# Patient Record
Sex: Female | Born: 1960 | Race: Black or African American | Hispanic: No | Marital: Single | Smoking: Never smoker
Health system: Southern US, Community
[De-identification: ages and names within clinical notes are randomized; demographics above are authoritative.]

## PROBLEM LIST (undated history)

## (undated) DIAGNOSIS — I639 Cerebral infarction, unspecified: Secondary | ICD-10-CM

## (undated) DIAGNOSIS — D329 Benign neoplasm of meninges, unspecified: Secondary | ICD-10-CM

## (undated) DIAGNOSIS — O039 Complete or unspecified spontaneous abortion without complication: Secondary | ICD-10-CM

## (undated) DIAGNOSIS — I10 Essential (primary) hypertension: Secondary | ICD-10-CM

## (undated) DIAGNOSIS — G459 Transient cerebral ischemic attack, unspecified: Secondary | ICD-10-CM

## (undated) DIAGNOSIS — F410 Panic disorder [episodic paroxysmal anxiety] without agoraphobia: Secondary | ICD-10-CM

## (undated) HISTORY — DX: Complete or unspecified spontaneous abortion without complication: O03.9

## (undated) HISTORY — DX: Essential (primary) hypertension: I10

## (undated) HISTORY — PX: BREAST SURGERY: SHX581

## (undated) HISTORY — DX: Cerebral infarction, unspecified: I63.9

## (undated) HISTORY — DX: Benign neoplasm of meninges, unspecified: D32.9

---

## 1999-04-10 ENCOUNTER — Emergency Department (HOSPITAL_COMMUNITY): Admission: EM | Admit: 1999-04-10 | Discharge: 1999-04-10 | Payer: Self-pay | Admitting: Emergency Medicine

## 2000-03-26 ENCOUNTER — Emergency Department (HOSPITAL_COMMUNITY): Admission: EM | Admit: 2000-03-26 | Discharge: 2000-03-26 | Payer: Self-pay | Admitting: *Deleted

## 2000-09-11 ENCOUNTER — Encounter: Payer: Self-pay | Admitting: Emergency Medicine

## 2000-09-11 ENCOUNTER — Emergency Department (HOSPITAL_COMMUNITY): Admission: EM | Admit: 2000-09-11 | Discharge: 2000-09-11 | Payer: Self-pay | Admitting: Emergency Medicine

## 2001-05-04 ENCOUNTER — Emergency Department (HOSPITAL_COMMUNITY): Admission: EM | Admit: 2001-05-04 | Discharge: 2001-05-05 | Payer: Self-pay | Admitting: Emergency Medicine

## 2004-09-14 ENCOUNTER — Emergency Department (HOSPITAL_COMMUNITY): Admission: EM | Admit: 2004-09-14 | Discharge: 2004-09-14 | Payer: Self-pay | Admitting: *Deleted

## 2006-07-15 ENCOUNTER — Emergency Department (HOSPITAL_COMMUNITY): Admission: EM | Admit: 2006-07-15 | Discharge: 2006-07-15 | Payer: Self-pay | Admitting: Emergency Medicine

## 2007-10-29 DIAGNOSIS — I639 Cerebral infarction, unspecified: Secondary | ICD-10-CM

## 2007-10-29 DIAGNOSIS — G459 Transient cerebral ischemic attack, unspecified: Secondary | ICD-10-CM

## 2007-10-29 DIAGNOSIS — D329 Benign neoplasm of meninges, unspecified: Secondary | ICD-10-CM

## 2007-10-29 HISTORY — DX: Benign neoplasm of meninges, unspecified: D32.9

## 2007-10-29 HISTORY — DX: Cerebral infarction, unspecified: I63.9

## 2007-10-29 HISTORY — DX: Transient cerebral ischemic attack, unspecified: G45.9

## 2007-11-19 ENCOUNTER — Emergency Department (HOSPITAL_COMMUNITY): Admission: EM | Admit: 2007-11-19 | Discharge: 2007-11-19 | Payer: Self-pay | Admitting: Emergency Medicine

## 2008-01-29 ENCOUNTER — Ambulatory Visit: Payer: Self-pay | Admitting: Internal Medicine

## 2008-01-29 ENCOUNTER — Ambulatory Visit: Payer: Self-pay | Admitting: *Deleted

## 2008-01-29 ENCOUNTER — Inpatient Hospital Stay (HOSPITAL_COMMUNITY): Admission: EM | Admit: 2008-01-29 | Discharge: 2008-02-02 | Payer: Self-pay | Admitting: Emergency Medicine

## 2008-01-29 ENCOUNTER — Encounter (INDEPENDENT_AMBULATORY_CARE_PROVIDER_SITE_OTHER): Payer: Self-pay | Admitting: *Deleted

## 2008-02-02 ENCOUNTER — Encounter (INDEPENDENT_AMBULATORY_CARE_PROVIDER_SITE_OTHER): Payer: Self-pay | Admitting: *Deleted

## 2008-03-04 ENCOUNTER — Encounter: Payer: Self-pay | Admitting: Internal Medicine

## 2008-03-14 DIAGNOSIS — O039 Complete or unspecified spontaneous abortion without complication: Secondary | ICD-10-CM | POA: Insufficient documentation

## 2008-03-14 DIAGNOSIS — Z8673 Personal history of transient ischemic attack (TIA), and cerebral infarction without residual deficits: Secondary | ICD-10-CM | POA: Insufficient documentation

## 2008-03-14 DIAGNOSIS — I6350 Cerebral infarction due to unspecified occlusion or stenosis of unspecified cerebral artery: Secondary | ICD-10-CM

## 2008-03-14 DIAGNOSIS — D320 Benign neoplasm of cerebral meninges: Secondary | ICD-10-CM | POA: Insufficient documentation

## 2008-03-14 DIAGNOSIS — IMO0002 Reserved for concepts with insufficient information to code with codable children: Secondary | ICD-10-CM | POA: Insufficient documentation

## 2008-03-28 ENCOUNTER — Encounter (INDEPENDENT_AMBULATORY_CARE_PROVIDER_SITE_OTHER): Payer: Self-pay | Admitting: *Deleted

## 2008-03-28 ENCOUNTER — Ambulatory Visit: Payer: Self-pay | Admitting: Internal Medicine

## 2008-03-28 LAB — CONVERTED CEMR LAB
BUN: 11 mg/dL (ref 6–23)
Chloride: 107 meq/L (ref 96–112)
Creatinine, Ser: 0.82 mg/dL (ref 0.40–1.20)

## 2008-05-30 ENCOUNTER — Telehealth: Payer: Self-pay | Admitting: Internal Medicine

## 2008-07-27 ENCOUNTER — Telehealth (INDEPENDENT_AMBULATORY_CARE_PROVIDER_SITE_OTHER): Payer: Self-pay | Admitting: *Deleted

## 2009-06-13 ENCOUNTER — Emergency Department (HOSPITAL_COMMUNITY): Admission: EM | Admit: 2009-06-13 | Discharge: 2009-06-13 | Payer: Self-pay | Admitting: Emergency Medicine

## 2009-06-16 ENCOUNTER — Telehealth: Payer: Self-pay | Admitting: Internal Medicine

## 2009-06-23 ENCOUNTER — Telehealth: Payer: Self-pay | Admitting: Internal Medicine

## 2009-07-10 ENCOUNTER — Telehealth: Payer: Self-pay | Admitting: Internal Medicine

## 2009-08-31 ENCOUNTER — Emergency Department (HOSPITAL_BASED_OUTPATIENT_CLINIC_OR_DEPARTMENT_OTHER): Admission: EM | Admit: 2009-08-31 | Discharge: 2009-08-31 | Payer: Self-pay | Admitting: Emergency Medicine

## 2009-08-31 ENCOUNTER — Ambulatory Visit: Payer: Self-pay | Admitting: Diagnostic Radiology

## 2009-11-13 ENCOUNTER — Emergency Department (HOSPITAL_COMMUNITY): Admission: EM | Admit: 2009-11-13 | Discharge: 2009-11-13 | Payer: Self-pay | Admitting: Emergency Medicine

## 2009-11-27 ENCOUNTER — Encounter: Payer: Self-pay | Admitting: Internal Medicine

## 2009-11-27 ENCOUNTER — Ambulatory Visit: Payer: Self-pay | Admitting: Internal Medicine

## 2009-11-27 LAB — CONVERTED CEMR LAB
AST: 12 units/L (ref 0–37)
Albumin: 4.4 g/dL (ref 3.5–5.2)
Alkaline Phosphatase: 51 units/L (ref 39–117)
BUN: 12 mg/dL (ref 6–23)
Creatinine, Ser: 0.65 mg/dL (ref 0.40–1.20)
HDL: 59 mg/dL (ref >39)
LDL Cholesterol: 138 mg/dL — ABNORMAL HIGH (ref 0–99)
Potassium: 4 meq/L (ref 3.5–5.3)
Total Bilirubin: 0.7 mg/dL (ref 0.3–1.2)
Total CHOL/HDL Ratio: 3.6
VLDL: 16 mg/dL (ref 0–40)

## 2010-04-13 ENCOUNTER — Emergency Department (HOSPITAL_COMMUNITY): Admission: EM | Admit: 2010-04-13 | Discharge: 2010-04-13 | Payer: Self-pay | Admitting: Emergency Medicine

## 2010-04-13 ENCOUNTER — Encounter: Payer: Self-pay | Admitting: Internal Medicine

## 2010-05-30 ENCOUNTER — Emergency Department (HOSPITAL_COMMUNITY): Admission: EM | Admit: 2010-05-30 | Discharge: 2010-05-30 | Payer: Self-pay | Admitting: Emergency Medicine

## 2010-11-27 NOTE — Miscellaneous (Signed)
Summary: HIPAA Restrictions  HIPAA Restrictions   Imported By: Florinda Marker 11/29/2009 14:20:28  _____________________________________________________________________  External Attachment:    Type:   Image     Comment:   External Document

## 2010-11-27 NOTE — Miscellaneous (Signed)
Summary: ED visit.  Patient came to ED after having a syncope/TIA episode, most likely precipitated by sleep deprivation and emotional stress. She is dealing with terminal illness on her dog and facing some difficulty regarding appropiate treatment and most likely end having the dog euthanized. Patient is complaining of mild weakness on her left side and also subjective decreased light touch sensation. In the ED she had a CT scan which demonstrated no acute intracranial abnormalities; patient was also evaluated by neurology and they recommended MRI, carotid dopplers and 2 D-echo. Patient leave AMA after feeling better in order to be with her Debbie Howard at the [facility] office.   This decision was discussed with neurology group, and they feel that would be ok to perform these test as an outpatient and to follow with them 2-4 weeks after having the results.   Followup appointment was arrange with Dr. Logan Bores on Moday 6, 2011 at 1:30pm. During that visit arrange those test to be done, make sure her left side weakness and subjective decrease sensation is resolved and arrange followup with Dr. Danae Orleans (neurology) once results are back.

## 2010-11-27 NOTE — Assessment & Plan Note (Signed)
Summary: EST-CK/FU/MES/CFB   Vital Signs:  Patient profile:   50 year old female Height:      67 inches Weight:      158.19 pounds BMI:     24.87 O2 Sat:      100 % on Room air Temp:     97.0 degrees F Pulse rate:   75 / minute Resp:     14 per minute BP sitting:   118 / 71  (right arm) Cuff size:   regular  Vitals Entered By: Ricky Ala, RN (November 27, 2009 2:57 PM)  O2 Flow:  Room air CC: patient states that she was seen at the ED approx. 2 weeks ago for her BP, here for follow up and rx for bp,  Hypertension Management Is Patient Diabetic? No Pain Assessment Patient in pain? no      Nutritional Status BMI of 19 -24 = normal  Have you ever been in a relationship where you felt threatened, hurt or afraid?No   Does patient need assistance? Functional Status Self care Ambulation Normal   Primary Care Provider:  Hartley Barefoot MD  CC:  patient states that she was seen at the ED approx. 2 weeks ago for her BP, here for follow up and rx for bp, and Hypertension Management.  History of Present Illness: She presents for follow up. She went to ED 3 weeks ago. She was given her BP medication. She relates that cholesterol medication was causing joint stiffness, and pain. She doesnt want to take it. She refused to take any other cholesterol medication.   She is taking aspirin. She denies problems from bp medications. She has not follow up with Dr Pearlean Brownie. She doesnt want to follow up her meningioma.   Hypertension History:      Negative major cardiovascular risk factors include female age less than 85 years old and non-tobacco-user status.        Positive history for target organ damage include prior stroke (or TIA).      Preventive Screening-Counseling & Management  Alcohol-Tobacco     Smoking Status: never     Passive Smoke Exposure: no  Current Medications (verified): 1)  Lisinopril 10 Mg  Tabs (Lisinopril) .... Take 1 Tab By Mouth At Bedtime 2)  Anacin 81 Mg   Tbec (Aspirin) .... Take 1 Tablet By Mouth Once A Day  Allergies: 1)  ! Iodine 2)  ! Pepcid 3)  ! * Shellfish  Review of Systems  The patient denies fever, chest pain, syncope, dyspnea on exertion, peripheral edema, prolonged cough, headaches, hemoptysis, abdominal pain, melena, hematochezia, and severe indigestion/heartburn.    Physical Exam  General:  alert, well-developed, and well-nourished.   Head:  normocephalic and atraumatic.   Lungs:  normal respiratory effort, no intercostal retractions, no accessory muscle use, and normal breath sounds.   Heart:  normal rate and regular rhythm.   Abdomen:  soft, non-tender, normal bowel sounds, no distention, no masses, and no guarding.   Neurologic:  alert & oriented X3, cranial nerves II-XII intact, strength normal in all extremities, sensation intact to light touch, and sensation intact to pinprick.     Impression & Recommendations:  Problem # 1:  HYPERTENSION NEC (ICD-997.91) Her blood pressure is well. I will continue current regimen. I will check Bmet. I will give refill for 3 months.  Orders: T-Comprehensive Metabolic Panel ([id number])  Problem # 2:  MENINGIOMA (ICD-225.2) She deneis headache, or vision changes. She doesnt want to have imaging to follow  up her meningioma. The importance of follow up was explained to the patient.   Problem # 3:  CEREBROVASCULAR ACCIDENT (ICD-434.91) She is taking aspirin daily. She refused to take any cholesterol medications, Importance of this was explain to the patient.  I will check lipid profile. Low fat diet list was provied during this visit.  Her updated medication list for this problem includes:    Anacin 81 Mg Tbec (Aspirin) .Marland Kitchen... Take 1 tablet by mouth once a day  Complete Medication List: 1)  Lisinopril 10 Mg Tabs (Lisinopril) .... Take 1 tab by mouth at bedtime 2)  Anacin 81 Mg Tbec (Aspirin) .... Take 1 tablet by mouth once a day  Other Orders: T-Lipid Profile  ([id number])  Hypertension Assessment/Plan:      The patient's hypertensive risk group is category C: Target organ damage and/or diabetes.  Today's blood pressure is 118/71.    Patient Instructions: 1)  Please schedule a follow-up appointment in 3 months. Prescriptions: LISINOPRIL 10 MG  TABS (LISINOPRIL) Take 1 tab by mouth at bedtime  #30 x 3   Entered and Authorized by:   Hartley Barefoot MD   Signed by:   Hartley Barefoot MD on 11/27/2009   Method used:   Electronically to        [facility] [phone]* (retail)       [address]       [place], [place]  [address]       Ph: [phone]       Fax: [phone]   RxID:   [id number]   Prevention & Chronic Care Immunizations   Influenza vaccine: Not documented   Influenza vaccine deferral: Deferred  (11/27/2009)    Tetanus booster: Not documented    Pneumococcal vaccine: Not documented  Colorectal Screening   Hemoccult: Not documented    Colonoscopy: Not documented  Other Screening   Pap smear: Not documented    Mammogram: Not documented   Smoking status: never  (11/27/2009)  Lipids   Total Cholesterol: Not documented   Lipid panel action/deferral: Lipid Panel ordered   LDL: Not documented   LDL Direct: Not documented   HDL: Not documented   Triglycerides: Not documented   Process Orders Check Orders Results:     [facility]: ABN not required for this insurance Tests Sent for requisitioning (November 28, 2009 8:49 PM):     11/27/2009: [facility] -- T-Comprehensive Metabolic Panel [[id number]] (signed)     11/27/2009: [facility] -- T-Lipid Profile [phone] (signed)   Process Orders Check Orders Results:     [facility]: ABN not required for this insurance Tests Sent for requisitioning (November 28, 2009 8:49 PM):     11/27/2009: [facility] -- T-Comprehensive Metabolic Panel [[id number]]  (signed)     11/27/2009: [facility] -- T-Lipid Profile [phone] (signed)

## 2010-12-31 ENCOUNTER — Encounter: Payer: Self-pay | Admitting: Ophthalmology

## 2011-01-13 LAB — DIFFERENTIAL
Basophils Absolute: 0 K/uL (ref 0.0–0.1)
Basophils Relative: 0 % (ref 0–1)
Eosinophils Absolute: 0 K/uL (ref 0.0–0.7)
Eosinophils Relative: 0 % (ref 0–5)
Lymphocytes Relative: 18 % (ref 12–46)
Lymphs Abs: 0.9 10*3/uL (ref 0.7–4.0)
Monocytes Absolute: 0.3 10*3/uL (ref 0.1–1.0)
Monocytes Relative: 6 % (ref 3–12)
Neutro Abs: 3.7 10*3/uL (ref 1.7–7.7)
Neutrophils Relative %: 76 % (ref 43–77)

## 2011-01-13 LAB — CBC
HCT: 42.6 % (ref 36.0–46.0)
Hemoglobin: 15.1 g/dL — ABNORMAL HIGH (ref 12.0–15.0)
MCHC: 35.3 g/dL (ref 30.0–36.0)
MCV: 88 fL (ref 78.0–100.0)
Platelets: 240 10*3/uL (ref 150–400)
RBC: 4.84 MIL/uL (ref 3.87–5.11)
RDW: 12.6 % (ref 11.5–15.5)
WBC: 4.9 10*3/uL (ref 4.0–10.5)

## 2011-01-13 LAB — POCT CARDIAC MARKERS
CKMB, poc: 3.1 ng/mL (ref 1.0–8.0)
Myoglobin, poc: 162 ng/mL (ref 12–200)
Troponin i, poc: <0.05 ng/mL (ref 0.00–0.09)

## 2011-01-13 LAB — COMPREHENSIVE METABOLIC PANEL
AST: 28 U/L (ref 0–37)
Albumin: 4.1 g/dL (ref 3.5–5.2)
BUN: 8 mg/dL (ref 6–23)
Calcium: 9.6 mg/dL (ref 8.4–10.5)
Creatinine, Ser: 0.84 mg/dL (ref 0.4–1.2)
GFR calc Af Amer: >60 mL/min (ref >60)

## 2011-01-13 LAB — CK TOTAL AND CKMB (NOT AT ARMC)
CK, MB: 8.3 ng/mL (ref 0.3–4.0)
Relative Index: 1.1 (ref 0.0–2.5)
Total CK: 758 U/L — ABNORMAL HIGH (ref 7–177)

## 2011-01-13 LAB — COMPREHENSIVE METABOLIC PANEL WITH GFR
ALT: 22 U/L (ref 0–35)
Alkaline Phosphatase: 58 U/L (ref 39–117)
CO2: 28 meq/L (ref 19–32)
Chloride: 104 meq/L (ref 96–112)
GFR calc non Af Amer: >60 mL/min (ref >60)
Glucose, Bld: 122 mg/dL — ABNORMAL HIGH (ref 70–99)
Potassium: 3.3 meq/L — ABNORMAL LOW (ref 3.5–5.1)
Sodium: 139 meq/L (ref 135–145)
Total Bilirubin: 0.7 mg/dL (ref 0.3–1.2)
Total Protein: 7.9 g/dL (ref 6.0–8.3)

## 2011-01-13 LAB — GLUCOSE, CAPILLARY: Glucose-Capillary: 111 mg/dL — ABNORMAL HIGH (ref 70–99)

## 2011-01-13 LAB — PROTIME-INR
INR: 1.04 (ref 0.00–1.49)
Prothrombin Time: 13.5 seconds (ref 11.6–15.2)

## 2011-01-13 LAB — APTT: aPTT: 27 seconds (ref 24–37)

## 2011-01-13 LAB — TROPONIN I: Troponin I: 0.01 ng/mL (ref 0.00–0.06)

## 2011-03-12 NOTE — Discharge Summary (Signed)
NAMEASHLINN, HEMRICK NO.:  [id number]   MEDICAL RECORD NO.:  [id number]          PATIENT TYPE:  INP   LOCATION:  4705                         FACILITY:  [facility]   PHYSICIAN:  Manning Charity, MD     DATE OF BIRTH:  Mar 13, 1961   DATE OF ADMISSION:  01/29/2008  DATE OF DISCHARGE:  02/02/2008                               DISCHARGE SUMMARY   DISCHARGE DIAGNOSES:  1. Acute frontal lobe precentral gyrus stroke.  2. Meningioma, incidental finding.  3. Hypertension.  4. Miscarriage.   DISCHARGE MEDICATIONS:  1. Lisinopril 10 mg by mouth daily.  2. Simvastatin 40 mg p.o. daily.  3. Aspirin 81 mg p.o. daily.   DISPOSITION AND FOLLOWUP:  Ms. Adalae Baysinger has an appointment at the  outpatient clinic of [facility] on February 25, 2008 at 1:30.  She is going  to need during that appointment a BMET to followup potassium and  Creatinine level because she was started on lisinopril.  She also will  see Dr. Pearlean Brownie, neurology, for meningioma follow up.   CONSULTATIONS:  Pramod P. Pearlean Brownie, MD.  He was consulted to help with the  evaluation of a stroke and with management of meningioma.   HISTORY OF PRESENT ILLNESS:  This is a 50 year old female who presents  complaining of right facial droop and Dysarthria that started when she  woke up.  She denies any weakness or numbness.  No vision problems.   PHYSICAL EXAMINATION:  VITAL SIGNS:  Temperature 98.7, blood pressure  147/96, pulse 87, respiration 22, and O2 saturation 99% on room air.  GENERAL:  She was alert and awake.  HEENT:  Eyes:  Pupils equal and reactive to light.  Extraocular muscles  intact.  RESPIRATION:  Clear breath sounds.  CARDIOVASCULAR:  S1 and S2.  Normal regular rhythm and rate.  NEUROLOGIC:  Cranial nerves II, III, IV,VI, and VII intact.  She has  left facial droop, tongue deviation to the left side.  She has mild  dysarthria.  Motor exam 5/5 throughout.  Sensory, she has gross  sensation intact and normal  sensation on the face.   LABORATORY DATA:  Sodium 139, potassium 3.7, chloride 106, bicarb 27,  BUN 13, and creatinine 0.78.  White blood cells 3.9, hemoglobin 13,  platelets  22,261.  PT 12.4 and PTT 26.  CT of the head, no acute  intracranial abnormalities.  Cystic lesions of the pineal gland.   PROCEDURES PERFORMED:  1. She had an angiogram that showed minimal right internal carotid      artery narrowing just distal to the bulb.  This might be even      positional.  Incidental note is made of minimal FNB-like changes in      the right vertebral artery at the level of C1 and C2.   1. She had an MRI of the head without contrast that showed chronic      probable infarct bordering the bilateral parieto-occipital sulci,      possible tiny chronic right PICA territory infarct, acute right      frontal lobe paracentral gyrus  infarct without hemorrhage or mass      effect in the region of facial motor representation.   1. 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   1. MRA of the head showed anterior circulation is within normal      limits.  No definitive right MCA branch occlusion is identified.      Poor flow in the distal posterior cerebral artery, right greater      than left is noted, maybe related to the chronic infarct described      above.  Otherwise, no strong evidence of intracranial      arteriosclerosis.  2. She had a CT of the head.  There were no acute intracranial      abnormalities.  3. A 2-D echo shows overall left ventricular systolic function was      normal.  Left ventricular ejection fraction was estimated to range      between 60% to 70%.  4. She had a TEE that showed left ventricular size normal, ejection      fraction 60%.  Aorta was normal.  Structurally normal heart.  No      echo evidence for cardiac source of thrombi.   HOSPITAL COURSE BY PROBLEMS:  1. Frontal lobe precentral gyrus stroke.  Ms Mamie Hundertmark was  admitted      to telemetry floor to monitor for any arrhythmia.  Her blood      pressure was monitored.  We did not start any antihypertensive      medications because of her stroke. Systolic blood pressure should      not be decrease unless is more than 200.  CT scan was ordered which      was significant for acute stroke.  She was started on aspirin.      Lipid profile was ordered.  LDL 136, HDL 45, cholesterol 175.  We      started her on simvastatin 40 mg daily.  Hemoglobin A1c was ordered      and was normal at 5.5.  ESR was normal at 8.  Due to her young      presentation with stroke and prior family history of stroke, we did      the workup for hypercoagulable state which came back negative.  ANA      was negative.  Antithrombin III was normal at 103.  Protein C was      normal at 96.  Protein S level was 92 and was normal.  HIV test was      nonreactive.  Factor V Leiden test was negative.  Also, we had a      consult from the cardiologists, who performed a TTE to look for the      source of a cardiac emboli and the TEE was negative.  Neurology was      consulted also to help with meningioma.  They will follow up her as      an outpatient.  Also homocysteine level was ordered and it was      normal.  During the hospitalization, her speech symptoms improved.      She was speaking better.  We started her on blood pressure      medication lisinopril 10 mg daily after she was stable.   1. Meningioma.  This was an incidental finding.  Neurology will      monitor this on an outpatient basis.   1.  Leukopenia.  She was found to have a white blood cell of 3.9.      Repeat white blood cells was normal with a level of 4.0.   1. Hypertension.  We started her on lisinopril 10 mg daily.  She will      have a BMET within 2 weeks at the outpatient clinic.   Discharge labs and vitals on the day of discharge was stable.  Her blood  pressure was 121/82, O2 saturation 96%, pulse 76, temperature  98.1, and  respirations 20.  She was in no acute distress.  Her neurological  symptoms have improved.  Labs with sodium 140, potassium 3.9, chloride  106, CO2 of 27, glucose 112, BUN 13, creatinine 0.83, and calcium 9.6.      Hartley Barefoot, MD  Electronically Signed      Manning Charity, MD  Electronically Signed    BR/MEDQ  D:  02/02/2008  T:  02/03/2008  Job:  [id number]   cc:   Pramod P. Pearlean Brownie, MD

## 2011-03-12 NOTE — Consult Note (Signed)
NAMEIDONIA, ZOLLINGER NO.:  [id number]   MEDICAL RECORD NO.:  [id number]          PATIENT TYPE:  INP   LOCATION:  4705                         FACILITY:  [facility]   PHYSICIAN:  Melvyn Novas, M.D.  DATE OF BIRTH:  27-Sep-1961   DATE OF CONSULTATION:  DATE OF DISCHARGE:                                 CONSULTATION   HISTORY OF PRESENT ILLNESS:  This is a 50 year old lady currently  admitted to the internal medicine teaching service, Dr. Gareth Eagle as  attending, Dr. Julaine Fusi called this consult.   This patient was admitted yesterday on January 29, 2008, in the morning  hours.  I see the patient today for the stroke consult on January 30, 2008,  the patient presented yesterday with an over 24-hour history of new  onset central facial palsy left-sided and tongue deviation to the left.  Imaging studies confirmed a frontal stroke and bicipital older strokes.  Incidentally, a meningioma was also found which is not likely to have  caused any of her complications.   The patient is alert and oriented, pleasant, fully oriented with good  memory.  She states that she awoke on Thursday and had noticed that her  tongue felt heavy.  She became aware of her deficit because she could  not eat well.  She is an [place] female.  She states she is not  aware of any past medical history.  Denies any drug or alcohol use and  is not in pain or any distress.  There was no peripheral weakness  noticed.  Her extremities worked well, she states.  All findings were  just related to her facial droop.   REVIEW OF SYSTEMS.:  The patient denies headache, dizziness, endorses  clarity of speech and with swallowing.  She also states that she started  to drool out of the left angle of her mouth.  She describes again that  her tongue felt heavy, but she had no agraphia, aphagia, dyslexia, nor  dysmetria, no fevers, no tremors, gait disorder.  No seizures.  No  nausea, vomiting or  diarrhea.   PAST MEDICAL HISTORY:  Again, the patient has no known history of any  the usual risk factors for stroke but a strong family history, as her  mother died at 60 of a stroke and her father at 44 years of age of an  MI.  She also had one brother dying of complications of diabetes.  She  has two living brothers and two sisters.   SOCIAL HISTORY:  The patient is single, has two adult daughters, but  lives alone.  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   LABORATORY DATA:  The patient has been found to have leukopenia with a  white blood cell count of 3.9, H&H was 13.0/38.7, MCV was 84 normal  range.  Her platelet count was 261,000.  Potassium was 3.7, sodium 39,  glucose was 98.  The patient's HbA1c was 5.5 and well within normal  limits.  PT was 12.4, INR 0.9, HDL 95,  and LDL 136.  Total cholesterol  was just under 200.  Sed rate was 8, C-reactive protein was pending.  The patient was described as having initially presented with lower  extremity edema which I cannot appreciate, but she had been in  sequential compression devices for the day.   HOSPITAL MEDICATIONS:  1. Ecotrin.  2. A full-size aspirin 325 grams a day.  3. Protonix 40 mg a day.   ALLERGIES:  SHE HAS NO KNOWN ALLERGIC REACTIONS TO MEDICATIONS.   PHYSICAL EXAMINATION:  GENERAL:  No goiter, no lymph nodes, no bruit, no  murmur.  No abdominal tenderness.  Again, I do not see clubbing,  cyanosis or edema.  No rash.  No bruising.  There is only facial and  tongue dysmetria and asymmetry.  VITAL SIGNS:  Respiratory rate of 17, blood pressure 130/70.  LUNGS:  Regular breathing sounds.  No wheezing.  No rhonchi.  No rales.  ABDOMEN:  Soft, nontender, nondistended.  There is no guarding.  HEART:  The patient had no systolic murmurs, no carotid bruits.  No  temporal artery induration, lymph node swelling or goiter.  NEUROLOGICAL:  Cranial nerves all intact except for a  central seventh  nerve palsy.  Speech is slightly impaired and dysarthric due to the  facial droop and tongue deviation.  A bedside swallow test was not  performed by myself, but the patient said that she was able to drink  fluids with a straw.  I did not find he had a swallowing evaluation  documented in the chart.  Motor examination shows 5/504 extremities with  deep tendon reflexes being preserved symmetrically and her toes going  down bilaterally to plantar stimulation, simultaneous stimulation for  sensory shows preserved primary modalities without extinction.  The  sensory is intact in the face as well.  Gait is stable and intact.  Coordination by finger-nose test is intact without ataxia, dysmetria or  tremor.   STUDIES:  MRI review shows a frontal stroke and older central occipital  stroke that are related probably to a long history of hypertension,  however, the patient is not aware of such.  It is only the most common  risk factor.  The patient has advanced white matter disease for age.   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  She has no  memory deficits.  She has no severe migraine, and her lower seventh  nerve is only involved on the left side.  Corneal reflexes are  preserved.  Full extraocular movements are preserved.  I suspect that  this is stroke related.  However, the stroke itself of the multi focal  imaging results need to be evaluated for vasculitis.  I would also check  by toxicology screen to make sure that the patient is not a cocaine  abuser.  I would recommend a protein electrophoresis and a C-reactive  protein, even that the sed rate was negative.  Embolic events are less  likely, but need to be ruled out, and I would await the echocardiogram  which is already ordered.  I agree with the aspirin which was started on  Monday when the stroke service is following the patient under Dr.   Marlis Edelson guidance.  I would like him to discuss with the teaching service  as an angiogram should be performed.      Melvyn Novas, M.D.  Electronically Signed     CD/MEDQ  D:  01/30/2008  T:  01/30/2008  Job:  [id number]   cc:   Edsel Petrin, D.O.

## 2011-03-14 ENCOUNTER — Emergency Department (HOSPITAL_COMMUNITY)
Admission: EM | Admit: 2011-03-14 | Discharge: 2011-03-14 | Disposition: A | Discharge status: Home / Self Care | Payer: Self-pay | Attending: Emergency Medicine | Admitting: Emergency Medicine

## 2011-03-14 ENCOUNTER — Inpatient Hospital Stay (INDEPENDENT_AMBULATORY_CARE_PROVIDER_SITE_OTHER)
Admission: RE | Admit: 2011-03-14 | Discharge: 2011-03-14 | Disposition: A | Discharge status: Home / Self Care | Payer: Self-pay | Source: Ambulatory Visit

## 2011-03-14 DIAGNOSIS — I10 Essential (primary) hypertension: Secondary | ICD-10-CM

## 2011-03-14 DIAGNOSIS — Z91199 Patient's noncompliance with other medical treatment and regimen due to unspecified reason: Secondary | ICD-10-CM | POA: Insufficient documentation

## 2011-03-14 DIAGNOSIS — Z9119 Patient's noncompliance with other medical treatment and regimen: Secondary | ICD-10-CM | POA: Insufficient documentation

## 2011-03-14 DIAGNOSIS — K0510 Chronic gingivitis, plaque induced: Secondary | ICD-10-CM | POA: Insufficient documentation

## 2011-03-14 DIAGNOSIS — Z8673 Personal history of transient ischemic attack (TIA), and cerebral infarction without residual deficits: Secondary | ICD-10-CM | POA: Insufficient documentation

## 2011-03-14 LAB — POCT I-STAT, CHEM 8
Chloride: 105 mEq/L (ref 96–112)
Creatinine, Ser: 0.9 mg/dL (ref 0.4–1.2)
Glucose, Bld: 131 mg/dL — ABNORMAL HIGH (ref 70–99)
Potassium: 3.5 mEq/L (ref 3.5–5.1)

## 2011-03-14 LAB — POCT URINALYSIS DIP (DEVICE)
Ketones, ur: NEGATIVE mg/dL
Protein, ur: NEGATIVE mg/dL
Specific Gravity, Urine: <=1.005 (ref 1.005–1.030)
pH: 6 (ref 5.0–8.0)

## 2011-07-23 LAB — BASIC METABOLIC PANEL
BUN: 13
BUN: 13
BUN: 8
CO2: 26
CO2: 27
CO2: 27
Chloride: 106
Chloride: 106
Chloride: 106
Creatinine, Ser: 0.78
Creatinine, Ser: 0.79
Glucose, Bld: 112 — ABNORMAL HIGH
Potassium: 3.9

## 2011-07-23 LAB — ANTI-MICROSOMAL ANTIBODY LIVER / KIDNEY: Liver-Kidney Microsomal Ab: <1:20 {titer}

## 2011-07-23 LAB — CBC
HCT: 37.7
Hemoglobin: 12.8
Hemoglobin: 13.5
MCHC: 33.7
MCHC: 34
MCV: 84.7
Platelets: 261
RBC: 4.68
RDW: 15.1

## 2011-07-23 LAB — URINALYSIS, MICROSCOPIC ONLY
Bilirubin Urine: NEGATIVE
Hgb urine dipstick: NEGATIVE
Ketones, ur: NEGATIVE
Protein, ur: NEGATIVE
Urobilinogen, UA: 0.2

## 2011-07-23 LAB — ANTITHROMBIN III: AntiThromb III Func: 103 (ref 76–126)

## 2011-07-23 LAB — PROTEIN ELECTROPH W RFLX QUANT IMMUNOGLOBULINS
Albumin ELP: 52.9 — ABNORMAL LOW
Alpha-1-Globulin: 5.1 — ABNORMAL HIGH
Alpha-2-Globulin: 11.4
Total Protein ELP: 6.9

## 2011-07-23 LAB — PROTEIN S, TOTAL: Protein S Ag, Total: 128 % (ref 70–140)

## 2011-07-23 LAB — RAPID URINE DRUG SCREEN, HOSP PERFORMED
Amphetamines: NOT DETECTED
Benzodiazepines: NOT DETECTED
Cocaine: NOT DETECTED
Tetrahydrocannabinol: NOT DETECTED

## 2011-07-23 LAB — HEPATIC FUNCTION PANEL
ALT: 17
AST: 18
Albumin: 3.9
Bilirubin, Direct: <0.1
Total Bilirubin: 0.5

## 2011-07-23 LAB — C-REACTIVE PROTEIN
CRP: 0.1 — ABNORMAL LOW (ref <0.6)
CRP: 0.2 — ABNORMAL LOW (ref <0.6)

## 2011-07-23 LAB — HOMOCYSTEINE: Homocysteine: 6.2

## 2011-07-23 LAB — PROTEIN S ACTIVITY: Protein S Activity: 92 % (ref 69–129)

## 2011-07-23 LAB — CARDIOLIPIN ANTIBODIES, IGG, IGM, IGA: Anticardiolipin IgA: 12 — ABNORMAL LOW (ref <13)

## 2011-07-23 LAB — PROTEIN C, TOTAL: Protein C, Total: 96 % (ref 70–140)

## 2011-07-23 LAB — LIPID PANEL: Cholesterol: 195

## 2011-07-23 LAB — HEMOGLOBIN A1C: Mean Plasma Glucose: 119

## 2011-07-23 LAB — FACTOR 5 LEIDEN

## 2011-07-23 LAB — PROTIME-INR: Prothrombin Time: 12.4

## 2011-07-23 LAB — SEDIMENTATION RATE: Sed Rate: 8

## 2012-02-04 ENCOUNTER — Emergency Department (HOSPITAL_COMMUNITY): Payer: Self-pay

## 2012-02-04 ENCOUNTER — Encounter (HOSPITAL_COMMUNITY): Payer: Self-pay | Admitting: Emergency Medicine

## 2012-02-04 ENCOUNTER — Emergency Department (HOSPITAL_COMMUNITY)
Admission: EM | Admit: 2012-02-04 | Discharge: 2012-02-04 | Disposition: A | Discharge status: Home / Self Care | Payer: Self-pay | Attending: Emergency Medicine | Admitting: Emergency Medicine

## 2012-02-04 DIAGNOSIS — M79609 Pain in unspecified limb: Secondary | ICD-10-CM | POA: Insufficient documentation

## 2012-02-04 DIAGNOSIS — Z7982 Long term (current) use of aspirin: Secondary | ICD-10-CM | POA: Insufficient documentation

## 2012-02-04 DIAGNOSIS — M25449 Effusion, unspecified hand: Secondary | ICD-10-CM | POA: Insufficient documentation

## 2012-02-04 DIAGNOSIS — M10041 Idiopathic gout, right hand: Secondary | ICD-10-CM

## 2012-02-04 DIAGNOSIS — Z8673 Personal history of transient ischemic attack (TIA), and cerebral infarction without residual deficits: Secondary | ICD-10-CM | POA: Insufficient documentation

## 2012-02-04 DIAGNOSIS — R609 Edema, unspecified: Secondary | ICD-10-CM | POA: Insufficient documentation

## 2012-02-04 DIAGNOSIS — Z79899 Other long term (current) drug therapy: Secondary | ICD-10-CM | POA: Insufficient documentation

## 2012-02-04 DIAGNOSIS — I10 Essential (primary) hypertension: Secondary | ICD-10-CM | POA: Insufficient documentation

## 2012-02-04 DIAGNOSIS — M109 Gout, unspecified: Secondary | ICD-10-CM | POA: Insufficient documentation

## 2012-02-04 MED ORDER — INDOMETHACIN 25 MG PO CAPS: 25.0000 mg | ORAL_CAPSULE | Freq: Three times a day (TID) | ORAL | Status: AC | PRN

## 2012-02-04 MED ORDER — PREDNISONE 20 MG PO TABS: 40.0000 mg | ORAL_TABLET | Freq: Every day | ORAL | Status: DC

## 2012-02-04 MED ORDER — HYDROCODONE-ACETAMINOPHEN 5-325 MG PO TABS
1.0000 | ORAL_TABLET | Freq: Once | ORAL | Status: AC
  Administered 2012-02-04: 1 via ORAL
  Filled 2012-02-04: qty 1

## 2012-02-04 MED ORDER — HYDROCODONE-ACETAMINOPHEN 5-500 MG PO TABS: 1.0000 | ORAL_TABLET | Freq: Four times a day (QID) | ORAL | Status: AC | PRN

## 2012-02-04 NOTE — ED Notes (Signed)
Pt expressing wanting to leave  Encouraged to stay  Explained delay to pt

## 2012-02-04 NOTE — Discharge Instructions (Signed)
I believe that you may have gout in your right thumb. Keep it elevated, ice. Take indocin as prescribed for pain and for gout. Take prednisone to help reduce inflammation. Take vicodin as prescribed as needed for severe pain. Do drive while taking. If not improving by the end of this week, make sure to follow up with a hand specialist as referred.  Gout Gout is an inflammatory condition (arthritis) caused by a buildup of uric acid crystals in the joints. Uric acid is a chemical that is normally present in the blood. Under some circumstances, uric acid can form into crystals in your joints. This causes joint redness, soreness, and swelling (inflammation). Repeat attacks are common. Over time, uric acid crystals can form into masses (tophi) near a joint, causing disfigurement. Gout is treatable and often preventable. CAUSES  The disease begins with elevated levels of uric acid in the blood. Uric acid is produced by your body when it breaks down a naturally found substance called purines. This also happens when you eat certain foods such as meats and fish. Causes of an elevated uric acid level include:  Being passed down from parent to child (heredity).   Diseases that cause increased uric acid production (obesity, psoriasis, some cancers).   Excessive alcohol use.   Diet, especially diets rich in meat and seafood.   Medicines, including certain cancer-fighting drugs (chemotherapy), diuretics, and aspirin.   Chronic kidney disease. The kidneys are no longer able to remove uric acid well.   Problems with metabolism.  Conditions strongly associated with gout include:  Obesity.   High blood pressure.   High cholesterol.   Diabetes.  Not everyone with elevated uric acid levels gets gout. It is not understood why some people get gout and others do not. Surgery, joint injury, and eating too much of certain foods are some of the factors that can lead to gout. SYMPTOMS   An attack of gout comes  on quickly. It causes intense pain with redness, swelling, and warmth in a joint.   Fever can occur.   Often, only one joint is involved. Certain joints are more commonly involved:   Base of the big toe.   Knee.   Ankle.   Wrist.   Finger.  Without treatment, an attack usually goes away in a few days to weeks. Between attacks, you usually will not have symptoms, which is different from many other forms of arthritis. DIAGNOSIS  Your caregiver will suspect gout based on your symptoms and exam. Removal of fluid from the joint (arthrocentesis) is done to check for uric acid crystals. Your caregiver will give you a medicine that numbs the area (local anesthetic) and use a needle to remove joint fluid for exam. Gout is confirmed when uric acid crystals are seen in joint fluid, using a special microscope. Sometimes, blood, urine, and X-ray tests are also used. TREATMENT  There are 2 phases to gout treatment: treating the sudden onset (acute) attack and preventing attacks (prophylaxis). Treatment of an Acute Attack  Medicines are used. These include anti-inflammatory medicines or steroid medicines.   An injection of steroid medicine into the affected joint is sometimes necessary.   The painful joint is rested. Movement can worsen the arthritis.   You may use warm or cold treatments on painful joints, depending which works best for you.   Discuss the use of coffee, vitamin C, or cherries with your caregiver. These may be helpful treatment options.  Treatment to Prevent Attacks After the acute attack  subsides, your caregiver may advise prophylactic medicine. These medicines either help your kidneys eliminate uric acid from your body or decrease your uric acid production. You may need to stay on these medicines for a very long time. The early phase of treatment with prophylactic medicine can be associated with an increase in acute gout attacks. For this reason, during the first few months of  treatment, your caregiver may also advise you to take medicines usually used for acute gout treatment. Be sure you understand your caregiver's directions. You should also discuss dietary treatment with your caregiver. Certain foods such as meats and fish can increase uric acid levels. Other foods such as dairy can decrease levels. Your caregiver can give you a list of foods to avoid. HOME CARE INSTRUCTIONS   Do not take aspirin to relieve pain. This raises uric acid levels.   Only take over-the-counter or prescription medicines for pain, discomfort, or fever as directed by your caregiver.   Rest the joint as much as possible. When in bed, keep sheets and blankets off painful areas.   Keep the affected joint raised (elevated).   Use crutches if the painful joint is in your leg.   Drink enough water and fluids to keep your urine clear or pale yellow. This helps your body get rid of uric acid. Do not drink alcoholic beverages. They slow the passage of uric acid.   Follow your caregiver's dietary instructions. Pay careful attention to the amount of protein you eat. Your daily diet should emphasize fruits, vegetables, whole grains, and fat-free or low-fat milk products.   Maintain a healthy body weight.  SEEK MEDICAL CARE IF:   You have an oral temperature above 102 F (38.9 C).   You develop diarrhea, vomiting, or any side effects from medicines.   You do not feel better in 24 hours, or you are getting worse.  SEEK IMMEDIATE MEDICAL CARE IF:   Your joint becomes suddenly more tender and you have:   Chills.   An oral temperature above 102 F (38.9 C), not controlled by medicine.  MAKE SURE YOU:   Understand these instructions.   Will watch your condition.   Will get help right away if you are not doing well or get worse.  Document Released: 10/11/2000 Document Revised: 10/03/2011 Document Reviewed: 01/22/2010 [facility] Patient Information 2012 [place], [place].

## 2012-02-04 NOTE — ED Provider Notes (Signed)
Medical screening examination/treatment/procedure(s) were performed by non-physician practitioner and as supervising physician I was immediately available for consultation/collaboration.  Olivia Mackie, MD 02/04/12 [phone]

## 2012-02-04 NOTE — ED Notes (Signed)
Pt is c/o pain in her right hand  Denies injury  Pt states pain started on Friday and started to swell on Saturday  Pt has swelling and redness noted to her thumb area  Pt states unable to move it

## 2012-02-04 NOTE — ED Provider Notes (Signed)
History     CSN: [id number]  Arrival date & time 02/04/12  0416   First MD Initiated Contact with Patient 02/04/12 [phone]      Chief Complaint  Patient presents with  . Hand Pain    (Consider location/radiation/quality/duration/timing/severity/associated sxs/prior treatment) Patient is a 51 y.o. female presenting with hand pain. The history is provided by the patient.  Hand Pain This is a new problem. The current episode started in the past 7 days. The problem occurs constantly. The problem has been unchanged. Associated symptoms include joint swelling. Pertinent negatives include no chills or fever.  Pt stats she noted that her right thumb was swollen 4 days ago. States swelling has not changed since then, but not improving. Denies injury. Denies fever, chills, redness. Stats pain with movement of the finger. No history of the same.   Past Medical History  Diagnosis Date  . SAB (spontaneous abortion)   . Hypertension   . 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   . 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 Breast surgery     Family History  Problem Relation Age of Onset  . Stroke Mother   . Hypertension Mother   . Diabetes Brother   . Stroke Brother     History  Substance Use Topics  . Smoking status: Never Smoker   . Smokeless tobacco: Not on file  . Alcohol Use: No    OB History    Grav Para Term Preterm Abortions TAB SAB Ect Mult Living                  Review of Systems  Constitutional: Negative for fever and chills.  Musculoskeletal: Positive for joint swelling.  All other systems reviewed and are negative.    Allergies  Famotidine; Iodine; and Prednisone  Home Medications   Current Outpatient Rx  Name Route Sig Dispense Refill  . ASPIRIN 325 MG PO TABS Oral Take 325 mg by mouth daily.    Marland Kitchen CALCIUM CARBONATE-VITAMIN D 500-200 MG-UNIT PO TABS  Oral Take 1 tablet by mouth daily.    . ADULT MULTIVITAMIN W/MINERALS CH Oral Take 1 tablet by mouth daily.    Marland Kitchen VITAMIN C 500 MG PO TABS Oral Take 1,000 mg by mouth daily.    . ASPIRIN 81 MG PO TBEC Oral Take 81 mg by mouth daily.      Marland Kitchen HYDROCODONE-ACETAMINOPHEN 5-500 MG PO TABS Oral Take 1-2 tablets by mouth every 6 (six) hours as needed for pain. 15 tablet 0  . INDOMETHACIN 25 MG PO CAPS Oral Take 1 capsule (25 mg total) by mouth 3 (three) times daily as needed. 30 capsule 0  . LISINOPRIL 10 MG PO TABS Oral Take 10 mg by mouth daily.      Marland Kitchen PREDNISONE 20 MG PO TABS Oral Take 2 tablets (40 mg total) by mouth daily. 10 tablet 0    BP 146/84  Pulse 68  Temp(Src) 98.9 F (37.2 C) (Oral)  Resp 18  SpO2 100%  Physical Exam  Nursing note and vitals reviewed. Constitutional: She is oriented to person, place, and time. She appears well-developed and well-nourished. No distress.  HENT:  Head: Normocephalic.  Eyes: Conjunctivae are normal.  Neck: Neck supple.  Cardiovascular: Normal rate, regular rhythm and normal heart sounds.   Pulmonary/Chest: Effort normal and breath sounds normal. No respiratory distress. She  has no wheezes. She has no rales.  Musculoskeletal: She exhibits edema and tenderness.       Swelling noted over the MCP joint of the right thumb. Pain with movement at that joint. No redness over the joint. It does not feel warm to the touch. Tender with palpation over that joint.   Neurological: She is alert and oriented to person, place, and time.  Skin: Skin is warm and dry.  Psychiatric: She has a normal mood and affect.    ED Course  Procedures (including critical care time)  Labs Reviewed - No data to display Dg Hand Complete Right  02/04/2012  *RADIOLOGY REPORT*  Clinical Data: Right thumb pain and swelling.  RIGHT HAND - COMPLETE 3+ VIEW  Comparison: None.  Findings: There is no evidence of acute fracture or dislocation. The right thumb is unremarkable in appearance.   The joint spaces are preserved; the soft tissues are unremarkable in appearance. The carpal rows are intact, and demonstrate normal alignment.  No radiopaque foreign bodies are seen.  A small osseous fragment overlying the ulnar styloid likely reflects remote injury.  IMPRESSION: No evidence of acute fracture or dislocation.  No radiopaque foreign bodies seen.  Original Report Authenticated By: Tonia Ghent, M.D.   X-ray negative. Suspect possible gout. I do not think this is infectious, pt afebrile, joint is not red or warm. Will treat as gout with hand follow up  1. Idiopathic gout of right hand       MDM          Lottie Mussel, PA 02/04/12 [phone]

## 2014-08-10 ENCOUNTER — Emergency Department (HOSPITAL_COMMUNITY): Payer: Self-pay

## 2014-08-10 ENCOUNTER — Emergency Department (HOSPITAL_COMMUNITY)
Admission: EM | Admit: 2014-08-10 | Discharge: 2014-08-10 | Disposition: A | Discharge status: Home / Self Care | Payer: Self-pay | Attending: Emergency Medicine | Admitting: Emergency Medicine

## 2014-08-10 ENCOUNTER — Encounter (HOSPITAL_COMMUNITY): Payer: Self-pay | Admitting: Emergency Medicine

## 2014-08-10 DIAGNOSIS — Z7982 Long term (current) use of aspirin: Secondary | ICD-10-CM | POA: Insufficient documentation

## 2014-08-10 DIAGNOSIS — Z8669 Personal history of other diseases of the nervous system and sense organs: Secondary | ICD-10-CM | POA: Insufficient documentation

## 2014-08-10 DIAGNOSIS — I10 Essential (primary) hypertension: Secondary | ICD-10-CM | POA: Insufficient documentation

## 2014-08-10 DIAGNOSIS — Y9389 Activity, other specified: Secondary | ICD-10-CM | POA: Insufficient documentation

## 2014-08-10 DIAGNOSIS — Z7952 Long term (current) use of systemic steroids: Secondary | ICD-10-CM | POA: Insufficient documentation

## 2014-08-10 DIAGNOSIS — M7072 Other bursitis of hip, left hip: Secondary | ICD-10-CM | POA: Insufficient documentation

## 2014-08-10 DIAGNOSIS — Z8673 Personal history of transient ischemic attack (TIA), and cerebral infarction without residual deficits: Secondary | ICD-10-CM | POA: Insufficient documentation

## 2014-08-10 MED ORDER — MELOXICAM 15 MG PO TABS: 15.0000 mg | ORAL_TABLET | Freq: Every day | ORAL | Status: DC

## 2014-08-10 MED ORDER — HYDROCODONE-ACETAMINOPHEN 5-325 MG PO TABS: 1.0000 | ORAL_TABLET | ORAL | Status: DC | PRN

## 2014-08-10 MED ORDER — KETOROLAC TROMETHAMINE 60 MG/2ML IM SOLN
60.0000 mg | Freq: Once | INTRAMUSCULAR | Status: AC
  Administered 2014-08-10: 60 mg via INTRAMUSCULAR
  Filled 2014-08-10: qty 2

## 2014-08-10 NOTE — ED Provider Notes (Signed)
CSN: [id number]     Arrival date & time 08/10/14  1919 History  This chart was scribed for a non-physician practitioner, Alvina Chou, PA-C working with Dorie Rank, MD by Martinique Peace, ED Scribe. The patient was seen in [facility]. The patient's care was started at 8:28 PM.     Chief Complaint  Patient presents with  . Hip Pain  . Leg Pain      HPI HPI Comments: Debbie Howard is a 53 y.o. female who presents to the Emergency Department complaining of left hip pain onset last Wednesday. Pt denies any falls or injuries that may be responsible. She notes she has tried applying heat to affected area and drinking different teas which provided her with relief until yesterday. Pt states that she ate a salty steak from [employer] yesterday for dinner and has been experiencing pain since. She also reports activity of carrying heavy bag of laundry recently which may be causing current pain. Daughter expresses concern regarding how to look for blood clots.    Past Medical History  Diagnosis Date  . SAB (spontaneous abortion)   . Hypertension   . 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   . 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 Breast surgery     Family History  Problem Relation Age of Onset  . Stroke Mother   . Hypertension Mother   . Diabetes Brother   . Stroke Brother    History  Substance Use Topics  . Smoking status: Never Smoker   . Smokeless tobacco: Not on file  . Alcohol Use: No   OB History   Grav Para Term Preterm Abortions TAB SAB Ect Mult Living                 Review of Systems  Constitutional: Negative for fever.  Gastrointestinal: Negative for nausea and vomiting.  Musculoskeletal:       Left hip pain.   All other systems reviewed and are negative.     Allergies  Famotidine; Iodine; and Prednisone  Home Medications   Prior to  Admission medications   Medication Sig Start Date End Date Taking? Authorizing Provider  aspirin 325 MG tablet Take 325 mg by mouth daily.    Historical Provider, MD  aspirin 81 MG EC tablet Take 81 mg by mouth daily.      Historical Provider, MD  calcium-vitamin D (OSCAL WITH D) 500-200 MG-UNIT per tablet Take 1 tablet by mouth daily.    Historical Provider, MD  lisinopril (PRINIVIL,ZESTRIL) 10 MG tablet Take 10 mg by mouth daily.      Historical Provider, MD  Multiple Vitamin (MULITIVITAMIN WITH MINERALS) TABS Take 1 tablet by mouth daily.    Historical Provider, MD  predniSONE (DELTASONE) 20 MG tablet Take 2 tablets (40 mg total) by mouth daily. 02/04/12   Tatyana A Kirichenko, PA-C  vitamin C (ASCORBIC ACID) 500 MG tablet Take 1,000 mg by mouth daily.    Historical Provider, MD   BP 129/75  Pulse 77  Temp(Src) 98.4 F (36.9 C) (Oral)  SpO2 97% Physical Exam  Nursing note and vitals reviewed. Constitutional: She is oriented to person, place, and time. She appears well-developed and well-nourished. No distress.  HENT:  Head: Normocephalic and atraumatic.  Eyes: Conjunctivae and EOM are normal.  Neck: Neck supple. No tracheal deviation present.  Cardiovascular: Normal rate.  Pulmonary/Chest: Effort normal. No respiratory distress.  Musculoskeletal: Normal range of motion.  Left posterior hip TTP over ischial tuberosity. No obvious deformity. Slightly limited ROM due to pain.   Neurological: She is alert and oriented to person, place, and time.  Strength and sensation equal and intact of LE's.   Skin: Skin is warm and dry.  Psychiatric: She has a normal mood and affect. Her behavior is normal.    ED Course  Procedures (including critical care time) Labs Review Labs Reviewed - No data to display  Results for orders placed during the hospital encounter of 03/14/11  POCT I-STAT, CHEM 8      Result Value Ref Range   Sodium 140  135 - 145 mEq/L   Potassium 3.5  3.5 - 5.1 mEq/L    Chloride 105  96 - 112 mEq/L   BUN 9  6 - 23 mg/dL   Creatinine, Ser 0.90  0.4 - 1.2 mg/dL   Glucose, Bld 131 (*) 70 - 99 mg/dL   Calcium, Ion 1.13  1.12 - 1.32 mmol/L   TCO2 25  0 - 100 mmol/L   Hemoglobin 16.7 (*) 12.0 - 15.0 g/dL   HCT 49.0 (*) 36.0 - 46.0 %  POCT URINALYSIS DIP (DEVICE)      Result Value Ref Range   Glucose, UA NEGATIVE  NEGATIVE mg/dL   Bilirubin Urine NEGATIVE  NEGATIVE   Ketones, ur NEGATIVE  NEGATIVE mg/dL   Specific Gravity, Urine <=1.005  1.005 - 1.030   Hgb urine dipstick TRACE (*) NEGATIVE   pH 6.0  5.0 - 8.0   Protein, ur NEGATIVE  NEGATIVE mg/dL   Urobilinogen, UA 0.2  0.0 - 1.0 mg/dL   Nitrite NEGATIVE  NEGATIVE   Leukocytes, UA    NEGATIVE   Value: NEGATIVE Biochemical Testing Only. Please order routine urinalysis from main lab if confirmatory testing is needed.   No results found.    Imaging Review Dg Hip Complete Left  08/10/2014   CLINICAL DATA:  53 year old female with a history of left hip pain  EXAM: LEFT HIP - COMPLETE 2+ VIEW  COMPARISON:  None.  FINDINGS: Bony pelvic ring is intact without acute bony abnormality. Bilateral hips projects normally over the acetabula.  Unremarkable appearance the bilateral proximal femurs. Dedicated images of the left hip demonstrate no cortical disruption.  No significant degenerative changes of the hips.  Irregular calcifications within the anatomic pelvis may be within the fecal stream, or potentially could represent calcifications of uterine myoma.  IMPRESSION: No acute bony abnormality identified, with unremarkable appearance of the left hip.  Signed,  Dulcy Fanny. Earleen Newport, DO  [facility]  [facility]   Electronically Signed   By: Corrie Mckusick D.O.   On: 08/10/2014 21:14     EKG Interpretation None     Medications - No data to display  8:34 PM- Treatment plan was discussed with patient who verbalizes understanding and agrees.   MDM   Final diagnoses:   Bursitis of left hip    9:46 PM Patient's xray unremarkable for acute changes. Patient likely has ischial bursitis and will be treated with Vicodin and mobic. Patient instructed to rest and apply ice to the affected area. Patient instructed this condition will likely resolve on its own. No injury or other symptoms.   I personally performed the services described in this documentation, which was scribed in my presence. The recorded information has been reviewed and is accurate.   Verline Lema  Barnet Glasgow, PA-C 08/10/14 2147

## 2014-08-10 NOTE — Discharge Instructions (Signed)
Take Mobic as directed for inflammation. Take Vicodin as needed for additional pain relief. Refer to attached documents for more information. Rest your hip and apply ice for symptom relief.

## 2014-08-10 NOTE — ED Notes (Signed)
Pt states that she has hip pain that radiates down her leg. Denies injury. Alert and oriented.

## 2014-08-11 NOTE — ED Provider Notes (Signed)
Medical screening examination/treatment/procedure(s) were performed by non-physician practitioner and as supervising physician I was immediately available for consultation/collaboration.    Dorie Rank, MD 08/11/14 [phone]

## 2014-11-29 ENCOUNTER — Emergency Department (HOSPITAL_COMMUNITY)
Admission: EM | Admit: 2014-11-29 | Discharge: 2014-11-29 | Disposition: A | Discharge status: Home / Self Care | Payer: 59 | Attending: Emergency Medicine | Admitting: Emergency Medicine

## 2014-11-29 ENCOUNTER — Encounter (HOSPITAL_COMMUNITY): Payer: Self-pay | Admitting: Physical Medicine and Rehabilitation

## 2014-11-29 ENCOUNTER — Emergency Department (HOSPITAL_COMMUNITY): Payer: 59

## 2014-11-29 DIAGNOSIS — Z791 Long term (current) use of non-steroidal anti-inflammatories (NSAID): Secondary | ICD-10-CM | POA: Insufficient documentation

## 2014-11-29 DIAGNOSIS — Z8673 Personal history of transient ischemic attack (TIA), and cerebral infarction without residual deficits: Secondary | ICD-10-CM | POA: Insufficient documentation

## 2014-11-29 DIAGNOSIS — Z7982 Long term (current) use of aspirin: Secondary | ICD-10-CM | POA: Insufficient documentation

## 2014-11-29 DIAGNOSIS — I10 Essential (primary) hypertension: Secondary | ICD-10-CM | POA: Insufficient documentation

## 2014-11-29 DIAGNOSIS — Z7952 Long term (current) use of systemic steroids: Secondary | ICD-10-CM | POA: Insufficient documentation

## 2014-11-29 DIAGNOSIS — R079 Chest pain, unspecified: Secondary | ICD-10-CM | POA: Insufficient documentation

## 2014-11-29 DIAGNOSIS — Z79899 Other long term (current) drug therapy: Secondary | ICD-10-CM | POA: Insufficient documentation

## 2014-11-29 DIAGNOSIS — Z85841 Personal history of malignant neoplasm of brain: Secondary | ICD-10-CM | POA: Insufficient documentation

## 2014-11-29 DIAGNOSIS — F419 Anxiety disorder, unspecified: Secondary | ICD-10-CM

## 2014-11-29 DIAGNOSIS — R0602 Shortness of breath: Secondary | ICD-10-CM | POA: Insufficient documentation

## 2014-11-29 HISTORY — DX: Transient cerebral ischemic attack, unspecified: G45.9

## 2014-11-29 LAB — COMPREHENSIVE METABOLIC PANEL
ALT: 20 U/L (ref 0–35)
ANION GAP: 6 (ref 5–15)
AST: 17 U/L (ref 0–37)
Albumin: 4.1 g/dL (ref 3.5–5.2)
Alkaline Phosphatase: 57 U/L (ref 39–117)
BUN: 9 mg/dL (ref 6–23)
CALCIUM: 9.6 mg/dL (ref 8.4–10.5)
CO2: 30 mmol/L (ref 19–32)
CREATININE: 0.84 mg/dL (ref 0.50–1.10)
Chloride: 104 mmol/L (ref 96–112)
GFR calc non Af Amer: 77 mL/min — ABNORMAL LOW (ref >90)
GFR, EST AFRICAN AMERICAN: 89 mL/min — AB (ref >90)
GLUCOSE: 104 mg/dL — AB (ref 70–99)
POTASSIUM: 3.8 mmol/L (ref 3.5–5.1)
Sodium: 140 mmol/L (ref 135–145)
TOTAL PROTEIN: 7.7 g/dL (ref 6.0–8.3)
Total Bilirubin: 0.6 mg/dL (ref 0.3–1.2)

## 2014-11-29 LAB — CBC WITH DIFFERENTIAL/PLATELET
BASOS PCT: 1 % (ref 0–1)
Basophils Absolute: 0 10*3/uL (ref 0.0–0.1)
EOS ABS: 0.1 10*3/uL (ref 0.0–0.7)
EOS PCT: 2 % (ref 0–5)
HEMATOCRIT: 41.7 % (ref 36.0–46.0)
Hemoglobin: 14 g/dL (ref 12.0–15.0)
LYMPHS PCT: 36 % (ref 12–46)
Lymphs Abs: 1.2 10*3/uL (ref 0.7–4.0)
MCH: 29 pg (ref 26.0–34.0)
MCHC: 33.6 g/dL (ref 30.0–36.0)
MCV: 86.3 fL (ref 78.0–100.0)
Monocytes Absolute: 0.4 10*3/uL (ref 0.1–1.0)
Monocytes Relative: 11 % (ref 3–12)
NEUTROS PCT: 50 % (ref 43–77)
Neutro Abs: 1.8 10*3/uL (ref 1.7–7.7)
Platelets: 251 10*3/uL (ref 150–400)
RBC: 4.83 MIL/uL (ref 3.87–5.11)
RDW: 12.7 % (ref 11.5–15.5)
WBC: 3.4 10*3/uL — ABNORMAL LOW (ref 4.0–10.5)

## 2014-11-29 LAB — I-STAT TROPONIN, ED: TROPONIN I, POC: 0 ng/mL (ref 0.00–0.08)

## 2014-11-29 NOTE — ED Notes (Signed)
Pt presents to department for evaluation of L sided chest pain and SOB. Ongoing x1 week. 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 Denies chest pain at the time. Respirations unlabored. Pt is alert and oriented x4.

## 2014-11-29 NOTE — Discharge Instructions (Signed)
Please start taking a daily low-dose aspirin. Do not hesitate to return to the ED for any new, worsening or concerning symptoms.  Do not hesitate to return to the emergency room for any new, worsening or concerning symptoms.  Please obtain primary care using resource guide below. But the minute you were seen in the emergency room and that they will need to obtain records for further outpatient management.   Chest Pain (Nonspecific) It is often hard to give a specific diagnosis for the cause of chest pain. There is always a chance that your pain could be related to something serious, such as a heart attack or a blood clot in the lungs. You need to follow up with your health care provider for further evaluation. CAUSES   Heartburn.  Pneumonia or bronchitis.  Anxiety or stress.  Inflammation around your heart (pericarditis) or lung (pleuritis or pleurisy).  A blood clot in the lung.  A collapsed lung (pneumothorax). It can develop suddenly on its own (spontaneous pneumothorax) or from trauma to the chest.  Shingles infection (herpes zoster virus). The chest wall is composed of bones, muscles, and cartilage. Any of these can be the source of the pain.  The bones can be bruised by injury.  The muscles or cartilage can be strained by coughing or overwork.  The cartilage can be affected by inflammation and become sore (costochondritis). DIAGNOSIS  Lab tests or other studies may be needed to find the cause of your pain. Your health care provider may have you take a test called an ambulatory electrocardiogram (ECG). An ECG records your heartbeat patterns over a 24-hour period. You may also have other tests, such as:  Transthoracic echocardiogram (TTE). During echocardiography, sound waves are used to evaluate how blood flows through your heart.  Transesophageal echocardiogram (TEE).  Cardiac monitoring. This allows your health care provider to monitor your heart rate and rhythm in real  time.  Holter monitor. This is a portable device that records your heartbeat and can help diagnose heart arrhythmias. It allows your health care provider to track your heart activity for several days, if needed.  Stress tests by exercise or by giving medicine that makes the heart beat faster. TREATMENT   Treatment depends on what may be causing your chest pain. Treatment may include:  Acid blockers for heartburn.  Anti-inflammatory medicine.  Pain medicine for inflammatory conditions.  Antibiotics if an infection is present.  You may be advised to change lifestyle habits. This includes stopping smoking and avoiding alcohol, caffeine, and chocolate.  You may be advised to keep your head raised (elevated) when sleeping. This reduces the chance of acid going backward from your stomach into your esophagus. Most of the time, nonspecific chest pain will improve within 2-3 days with rest and mild pain medicine.  HOME CARE INSTRUCTIONS   If antibiotics were prescribed, take them as directed. Finish them even if you start to feel better.  For the next few days, avoid physical activities that bring on chest pain. Continue physical activities as directed.  Do not use any tobacco products, including cigarettes, chewing tobacco, or electronic cigarettes.  Avoid drinking alcohol.  Only take medicine as directed by your health care provider.  Follow your health care provider's suggestions for further testing if your chest pain does not go away.  Keep any follow-up appointments you made. If you do not go to an appointment, you could develop lasting (chronic) problems with pain. If there is any problem keeping an appointment, call  to reschedule. SEEK MEDICAL CARE IF:   Your chest pain does not go away, even after treatment.  You have a rash with blisters on your chest.  You have a fever. SEEK IMMEDIATE MEDICAL CARE IF:   You have increased chest pain or pain that spreads to your arm,  neck, jaw, back, or abdomen.  You have shortness of breath.  You have an increasing cough, or you cough up blood.  You have severe back or abdominal pain.  You feel nauseous or vomit.  You have severe weakness.  You faint.  You have chills. This is an emergency. Do not wait to see if the pain will go away. Get medical help at once. Call your local emergency services (911 in U.S.). Do not drive yourself to the hospital. MAKE SURE YOU:   Understand these instructions.  Will watch your condition.  Will get help right away if you are not doing well or get worse. Document Released: 07/24/2005 Document Revised: 10/19/2013 Document Reviewed: 05/19/2008 [facility] Patient Information 2015 [place], [place]. This information is not intended to replace advice given to you by your health care provider. Make sure you discuss any questions you have with your health care provider.   Emergency Department Resource Guide 1) Find a Doctor and Pay Out of Pocket Although you won't have to find out who is covered by your insurance plan, it is a good idea to ask around and get recommendations. You will then need to call the office and see if the doctor you have chosen will accept you as a new patient and what types of options they offer for patients who are self-pay. Some doctors offer discounts or will set up payment plans for their patients who do not have insurance, but you will need to ask so you aren't surprised when you get to your appointment.  2) Contact Your Local Health Department Not all health departments have doctors that can see patients for sick visits, but many do, so it is worth a call to see if yours does. If you don't know where your local health department is, you can check in your phone book. The CDC also has a tool to help you locate your state's health department, and many state websites also have listings of all of their local health departments.  3) Find a [facility] If your  illness is not likely to be very severe or complicated, you may want to try a walk in clinic. These are popping up all over the country in pharmacies, drugstores, and shopping centers. They're usually staffed by nurse practitioners or physician assistants that have been trained to treat common illnesses and complaints. They're usually fairly quick and inexpensive. However, if you have serious medical issues or chronic medical problems, these are probably not your best option.  No Primary Care Doctor: - Call Health Connect at  [phone] - they can help you locate a primary care doctor that  accepts your insurance, provides certain services, etc. - Physician Referral Service- [phone]  Chronic Pain Problems: Organization         Address  Phone   Notes  [facility]  [phone] Patients need to be referred by their primary care doctor.   Medication Assistance: Organization         Address  Phone   Notes  [facility] [address] [place], [place] [address] [phone] --Must be a resident of [facility] -- Must  have NO insurance coverage whatsoever (no Medicaid/ Medicare, etc.) -- The pt. MUST have a primary care doctor that directs their care regularly and follows them in the community   MedAssist  [phone]   [employer]  [phone]    Agencies that provide inexpensive medical care: Organization         Address  Phone   Notes  [place]  [phone]   [facility]    [phone]   [facility] [place], [place] [address] [phone]   [place] [address] [place] [phone]   Planned Parenthood    [phone]   [facility]    [phone]   [place] [address], [place] Phone:  [phone], Fax:  [phone] Hours of Operation:   9 am - 6 pm, M-F.  Also accepts Medicaid/Medicare and self-pay.  [place] for [place] [address], [address], [place] Phone: [phone], Fax: [phone]. Hours of Operation:  8:30 am - 5:30 pm, M-F.  Also accepts Medicaid and self-pay.  [facility] [address] [place] Phone: [phone]   [place], [place], [place] [phone], Ext. [phone] Mondays & Thursdays: 7-9 AM.  First 15 patients are seen on a first come, first serve basis.    [place] Providers:  Organization         Address  Phone   Notes  [facility] [address], [place] [phone] Also accepts self-pay patients.  [facility] [address], [place]  [phone]   [place], [address], [place] [phone]   [facility] [address], [place] [phone]   Lucianne Lei [address], [place]   [phone] Only accepts [place] Access [place] patients after they have their name applied to their card.   Self-Pay (no insurance) in [facility]:  Organization         Address  Phone   Notes  Sickle Cell Patients, [facility] [place] [phone]   [facility]gent Care [place] [phone]   [facility] [place]  [place], [place], [place] [phone]   [facility]/Dr. Osei-Bonsu  [address], [place] or [address], [place] [phone] Phone number for both [place] and [place] locations is the same.  Urgent Medical and [facility] [address] [place] [phone]   [facility] [address] [place] or [address] [phone]   [facility] [address], [place] [phone], phone; [phone], fax Sees patients 1st and 3rd Saturday of every month.  Must not qualify for public or private insurance (i.e. Medicaid, Medicare, [place] Health Choice, Veterans' Benefits)  Household income should be no more than 200% of the poverty level The clinic cannot treat you if you are pregnant or think you are pregnant  Sexually transmitted diseases are not treated at the clinic.    Dental Care: Organization         Address  Phone  Notes  [facility] [address] [place] [phone] Accepts children up to age 97 who are enrolled in [place] or [place]; pregnant women with a Medicaid card; and children who have applied for Medicaid or [place] Health Choice, but were declined, whose parents can pay a reduced fee at time of service.  [facility] Department of [facility]  [address], [place] [phone] Accepts children up to age 68 who are enrolled in [place] or [place]; pregnant women with a Medicaid card; and children who have applied for Medicaid or [place] Health Choice, but were declined, whose parents can pay a reduced fee at time of service.  [place] Adult Dental Access PROGRAM  [place] [phone] Patients are seen by appointment only. Walk-ins are not accepted. [place] will see patients 70 years of age and older. Monday - Tuesday (8am-5pm) Most Wednesdays (8:30-5pm) $30 per visit, cash only  [facility] Adult Dental Access PROGRAM  [address], [facility] [phone] Patients are seen by appointment only. Walk-ins are not accepted. [facility] will see patients 40 years of age and older. One Wednesday Evening (Monthly: Volunteer Based).  $30 per visit, cash only  [place]  [phone] for adults; Children under age 4, call Graduate Pediatric Dentistry at  [phone]. Children aged 67-14, please call [phone] to request a pediatric application.  Dental services are provided in all areas of dental care including fillings, crowns and bridges, complete and partial dentures, implants, gum treatment, root canals, and extractions. Preventive care is also provided. Treatment is provided to both adults and children. Patients are selected via a lottery and there is often a waiting list.   [facility] [address] [place]  [phone] [email or web address]   [facility] [address] [place], [place] [phone], Ext. [phone] Second and Fourth Thursday of each month, opens at 6:30 AM; Clinic ends at 9 AM.  Patients are seen on a first-come first-served basis, and a limited number are seen during each clinic.   [facility]  [address] Hillard Danker [place], [place] [phone]   Eligibility Requirements You must have lived in [place], [place], or [place] counties for at least the last three months.   You cannot be eligible for state or federal sponsored [employer], including [employer], [place], or [employer].   You generally cannot be eligible for healthcare insurance through your employer.    How to apply: Eligibility screenings are held every Tuesday and Wednesday afternoon from 1:00 pm until 4:00 pm. You do not need an appointment for the interview!  [facility] [address], [place], [place]   [place]  [place] Department  [place]  [phone]    Behavioral Health Resources in the Community: Intensive Outpatient Programs Organization         Address  Phone  Notes  [place]. [address], [place], [place] [phone]   [facility] Outpatient [address] [place], [place]   ADS: Alcohol &  Drug Svcs [address] [place], [place]   [place] [address],  [place], [place] or [phone]   Substance Abuse Resources Organization  Address  Phone  Notes  Alcohol and Drug Services  [phone]   [place]  [phone]   [facility] [place]  [phone]   [place]inita [place]  [phone]   Residential & Outpatient Substance Abuse Program  [phone]   Psychological Services Organization         Address  Phone  Notes  [facility] [place]  [place]  [phone]   [place] [address] [place] or [phone]    Mobile Crisis Teams Organization         Address  Phone  Notes  [facility], Mobile Crisis Care Unit  [phone]   [facility]  [address] [place], [place]   [place] Levels [address] [place] [phone]    Self-Help/Support Groups Organization         Address  Phone             Notes  [place]. of [place] - variety of support groups  [place] Call for more information  Narcotics Anonymous (NA), [facility] [address], [employer] [place]  2 meetings at this location   Special educational needs teacher         Address  Phone  Notes  ASAP Residential Treatment [place],    [place]  [phone]   [facility]  [address] [place] [phone], [place], [place]   [place], [place] [phone] Admissions: 8am-3pm M-F  Incentives Substance [place] [address]    [place], [place] [phone]   [facility] [address] [place], [place], [place]   [facility] [address]  [place], [place]   [facility] - Intensive Outpatient [address] [place], [place], [place]   [facility] ([place] [place].,  [place], [place] [phone] or [phone]   [facility]idential Treatment Services (RTS) [address] [place], [place] Accepts Medicaid  [facility] [place] [address]  [place] [phone] Substance Abuse/Addiction Treatment   [facility] Organization         Address  Phone  Notes  [facility]  [phone]   Domenic Schwab, PhD [address] Arlis [place], [place]   [phone] or [phone]   [place], [place] [phone]   [facility] [address], [place], [place] [phone] Insurance/Medicaid/sponsorship through [facility] and Families [address], [address]                                    [place], [place] [phone] [place] [address], [place] [phone]    Dr. Adele Schilder  [phone]   [facility] Dept. 1) [address], [place] 2) [place] 3)  [address], [place] [phone]  [phone]   [place] [phone] or [phone] (After Hours)

## 2014-11-29 NOTE — ED Notes (Signed)
STATES CAME TO ED FOR REFILL ON BP MEDS AND HAVE SINUS CONGESTION. STATES HAS BEEN OFF BP X YEARS INTERMITTENTLY. STATES DOES NOT HAVE PCP. STATES RECENTLY BECAME INSURED. STATES NEEDS DR'S NOTE.

## 2014-11-29 NOTE — ED Provider Notes (Signed)
CSN: [id number]     Arrival date & time 11/29/14  1444 History   First MD Initiated Contact with Patient 11/29/14 1825     Chief Complaint  Patient presents with  . Chest Pain  . Shortness of Breath     (Consider location/radiation/quality/duration/timing/severity/associated sxs/prior Treatment) HPI  Debbie Howard is a 54 y.o. female complaining of left-sided chest pain onset 4 days ago resolved 2 days ago. Pt states that she was told by her landlord that should have to get rid of some of her 5 dogs: P CP resolved 2 days.  States that she had shortness of breath the chest pain which is now also resolved. She has been in her house and feeling sad  2 days. She is not a tobacco smoker. She does have a family history of cardiac disease, her father had his first heart attack at age 48. Patient denies any diaphoresis, nausea, vomiting, syncope, history of DVT or PE, recent immobilizations, calf pain or leg swelling, pleuritic pain, cough, fever, chills, suicidal ideation, homicidal ideation, auditory or visual hallucinations, drug or alcohol abuse. Requesting work note   Past Medical History  Diagnosis Date  . SAB (spontaneous abortion)   . Hypertension   . 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   . 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 Transient ischemic attack 2009    x 3   Past Surgical History  Procedure Laterality Date  . Breast surgery     Family History  Problem Relation Age of Onset  . Stroke Mother   . Hypertension Mother   . Diabetes Brother   . Stroke Brother    History  Substance Use Topics  . Smoking status: Never Smoker   . Smokeless tobacco: Never Used  . Alcohol Use: No   OB History    No data available     Review of Systems  10 systems reviewed and found to be negative, except as noted in the HPI.   Allergies  Famotidine; Iodine; and Prednisone  Home Medications   Prior to  Admission medications   Medication Sig Start Date End Date Taking? Authorizing Provider  aspirin 325 MG tablet Take 325 mg by mouth daily.   Yes Historical Provider, MD  calcium-vitamin D (OSCAL WITH D) 500-200 MG-UNIT per tablet Take 1 tablet by mouth daily.   Yes Historical Provider, MD  Cyanocobalamin (VITAMIN B 12) 100 MCG LOZG Take 100 mcg by mouth daily.   Yes Historical Provider, MD  Multiple Vitamin (MULITIVITAMIN WITH MINERALS) TABS Take 1 tablet by mouth daily.   Yes Historical Provider, MD  vitamin C (ASCORBIC ACID) 500 MG tablet Take 1,000 mg by mouth daily.   Yes Historical Provider, MD  Vitamin E 100 UNITS TABS Take 1,000 Units by mouth daily.   Yes Historical Provider, MD  HYDROcodone-acetaminophen (NORCO/VICODIN) 5-325 MG per tablet Take 1-2 tablets by mouth every 4 (four) hours as needed for moderate pain or severe pain. 08/10/14   Kaitlyn Szekalski, PA-C  lisinopril (PRINIVIL,ZESTRIL) 10 MG tablet Take 10 mg by mouth daily.      Historical Provider, MD  meloxicam (MOBIC) 15 MG tablet Take 1 tablet (15 mg total) by mouth daily. 08/10/14   Kaitlyn Szekalski, PA-C  predniSONE (DELTASONE) 20 MG tablet Take 2 tablets (40 mg total) by mouth daily. 02/04/12   Tatyana A Kirichenko, PA-C   BP 128/78 mmHg  Pulse 86  Temp(Src)  98.1 F (36.7 C) (Oral)  Resp 18  SpO2 100% Physical Exam  Constitutional: She is oriented to person, place, and time. She appears well-developed and well-nourished. No distress.  HENT:  Head: Normocephalic and atraumatic.  Mouth/Throat: Oropharynx is clear and moist.  Eyes: Conjunctivae and EOM are normal.  Neck: Normal range of motion. No JVD present.  Cardiovascular: Normal rate, regular rhythm and intact distal pulses.   Pulmonary/Chest: Effort normal and breath sounds normal. No stridor. No respiratory distress. She has no wheezes. She has no rales. She exhibits no tenderness.  Abdominal: Soft. Bowel sounds are normal. She exhibits no distension and no  mass. There is no tenderness. There is no rebound and no guarding.  Musculoskeletal: Normal range of motion. She exhibits no edema or tenderness.  No calf asymmetry, superficial collaterals, palpable cords, edema, Homans sign negative bilaterally.    Neurological: She is alert and oriented to person, place, and time.  Psychiatric: She has a normal mood and affect.  Nursing note and vitals reviewed.   ED Course  Procedures (including critical care time) Labs Review Labs Reviewed  CBC WITH DIFFERENTIAL/PLATELET - Abnormal; Notable for the following:    WBC 3.4 (*)    All other components within normal limits  COMPREHENSIVE METABOLIC PANEL - Abnormal; Notable for the following:    Glucose, Bld 104 (*)    GFR calc non Af Amer 77 (*)    GFR calc Af Amer 89 (*)    All other components within normal limits  I-STAT TROPOININ, ED    Imaging Review Dg Chest 2 View  11/29/2014   CLINICAL DATA:  Chest pain and shortness of breath for 1 week  EXAM: CHEST  2 VIEW  COMPARISON:  09/14/2004  FINDINGS: Normal heart size and stable mediastinal contours. 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 No acute infiltrate or edema. No effusion or pneumothorax. Thoracic dextroscoliosis. No acute osseous findings.  IMPRESSION: 1. No active cardiopulmonary disease. 2. 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   Electronically Signed   By: Jorje Guild M.D.   On: 11/29/2014 15:48     EKG Interpretation None      MDM   Final diagnoses:  Anxiety  Chest pain, unspecified chest pain type    Filed Vitals:   11/29/14 1459 11/29/14 1714 11/29/14 1828 11/29/14 1852  BP: 124/79 125/70 128/78 118/72  Pulse: 61 86    Temp: 98.1 F (36.7 C)   98.1 F (36.7 C)  TempSrc: Oral   Oral  Resp: 18 16 18 20   SpO2: 99% 100% 100% 100%    Debbie Howard is a pleasant 54 y.o.  female presenting with resolved chest pain and she is upset over the fact that her landlady has asked her to decrease the number of dogs that she keeps. Her EKG is nonischemic, no significant arrhythmia or abnormality. Patient's chest pain has fully resolved. She is requesting refill of blood pressure medications, however, her blood pressure is normal in the ED. She's not have any acute psychiatric conditions or inpatient evaluation. Work note provided.  Evaluation does not show pathology that would require ongoing emergent intervention or inpatient treatment. Pt is hemodynamically stable and mentating appropriately. Discussed findings and plan with patient/guardian, who agrees with care plan. All questions answered. Return precautions discussed and outpatient follow up given.        Monico Blitz, PA-C  11/29/14 0102  Ernestina Patches, MD 11/30/14 7253

## 2014-12-07 ENCOUNTER — Emergency Department (HOSPITAL_COMMUNITY)
Admission: EM | Admit: 2014-12-07 | Discharge: 2014-12-07 | Disposition: A | Discharge status: Home / Self Care | Payer: 59 | Attending: Emergency Medicine | Admitting: Emergency Medicine

## 2014-12-07 ENCOUNTER — Encounter (HOSPITAL_COMMUNITY): Payer: Self-pay | Admitting: Emergency Medicine

## 2014-12-07 DIAGNOSIS — S46812A Strain of other muscles, fascia and tendons at shoulder and upper arm level, left arm, initial encounter: Secondary | ICD-10-CM | POA: Insufficient documentation

## 2014-12-07 DIAGNOSIS — X58XXXA Exposure to other specified factors, initial encounter: Secondary | ICD-10-CM | POA: Insufficient documentation

## 2014-12-07 DIAGNOSIS — R1011 Right upper quadrant pain: Secondary | ICD-10-CM | POA: Insufficient documentation

## 2014-12-07 DIAGNOSIS — Y9289 Other specified places as the place of occurrence of the external cause: Secondary | ICD-10-CM | POA: Diagnosis not present

## 2014-12-07 DIAGNOSIS — Z862 Personal history of diseases of the blood and blood-forming organs and certain disorders involving the immune mechanism: Secondary | ICD-10-CM | POA: Diagnosis not present

## 2014-12-07 DIAGNOSIS — R110 Nausea: Secondary | ICD-10-CM | POA: Insufficient documentation

## 2014-12-07 DIAGNOSIS — I10 Essential (primary) hypertension: Secondary | ICD-10-CM | POA: Insufficient documentation

## 2014-12-07 DIAGNOSIS — Y998 Other external cause status: Secondary | ICD-10-CM | POA: Insufficient documentation

## 2014-12-07 DIAGNOSIS — Z7952 Long term (current) use of systemic steroids: Secondary | ICD-10-CM | POA: Insufficient documentation

## 2014-12-07 DIAGNOSIS — Y9389 Activity, other specified: Secondary | ICD-10-CM | POA: Insufficient documentation

## 2014-12-07 DIAGNOSIS — Z8673 Personal history of transient ischemic attack (TIA), and cerebral infarction without residual deficits: Secondary | ICD-10-CM | POA: Diagnosis not present

## 2014-12-07 DIAGNOSIS — Z79899 Other long term (current) drug therapy: Secondary | ICD-10-CM | POA: Diagnosis not present

## 2014-12-07 DIAGNOSIS — S46811A Strain of other muscles, fascia and tendons at shoulder and upper arm level, right arm, initial encounter: Secondary | ICD-10-CM

## 2014-12-07 LAB — COMPREHENSIVE METABOLIC PANEL
ALK PHOS: 58 U/L (ref 39–117)
ALT: 17 U/L (ref 0–35)
AST: 16 U/L (ref 0–37)
Albumin: 4.3 g/dL (ref 3.5–5.2)
Anion gap: 7 (ref 5–15)
BUN: 11 mg/dL (ref 6–23)
CALCIUM: 9.6 mg/dL (ref 8.4–10.5)
CO2: 27 mmol/L (ref 19–32)
Chloride: 107 mmol/L (ref 96–112)
Creatinine, Ser: 0.69 mg/dL (ref 0.50–1.10)
GFR calc Af Amer: >90 mL/min (ref >90)
GLUCOSE: 93 mg/dL (ref 70–99)
Potassium: 3.5 mmol/L (ref 3.5–5.1)
Sodium: 141 mmol/L (ref 135–145)
Total Bilirubin: 0.6 mg/dL (ref 0.3–1.2)
Total Protein: 7.9 g/dL (ref 6.0–8.3)

## 2014-12-07 LAB — CBC WITH DIFFERENTIAL/PLATELET
BASOS ABS: 0 10*3/uL (ref 0.0–0.1)
BASOS PCT: 1 % (ref 0–1)
Eosinophils Absolute: 0 10*3/uL (ref 0.0–0.7)
Eosinophils Relative: 1 % (ref 0–5)
HEMATOCRIT: 42.3 % (ref 36.0–46.0)
Hemoglobin: 14.3 g/dL (ref 12.0–15.0)
LYMPHS PCT: 38 % (ref 12–46)
Lymphs Abs: 1.6 10*3/uL (ref 0.7–4.0)
MCH: 29.6 pg (ref 26.0–34.0)
MCHC: 33.8 g/dL (ref 30.0–36.0)
MCV: 87.6 fL (ref 78.0–100.0)
Monocytes Absolute: 0.2 10*3/uL (ref 0.1–1.0)
Monocytes Relative: 6 % (ref 3–12)
NEUTROS ABS: 2.3 10*3/uL (ref 1.7–7.7)
Neutrophils Relative %: 54 % (ref 43–77)
PLATELETS: 253 10*3/uL (ref 150–400)
RBC: 4.83 MIL/uL (ref 3.87–5.11)
RDW: 12.8 % (ref 11.5–15.5)
WBC: 4.1 10*3/uL (ref 4.0–10.5)

## 2014-12-07 LAB — URINALYSIS, ROUTINE W REFLEX MICROSCOPIC
Bilirubin Urine: NEGATIVE
Glucose, UA: NEGATIVE mg/dL
HGB URINE DIPSTICK: NEGATIVE
Ketones, ur: NEGATIVE mg/dL
LEUKOCYTES UA: NEGATIVE
NITRITE: NEGATIVE
PROTEIN: NEGATIVE mg/dL
Specific Gravity, Urine: 1.004 — ABNORMAL LOW (ref 1.005–1.030)
UROBILINOGEN UA: 0.2 mg/dL (ref 0.0–1.0)
pH: 7 (ref 5.0–8.0)

## 2014-12-07 LAB — LIPASE, BLOOD: Lipase: 27 U/L (ref 11–59)

## 2014-12-07 MED ORDER — HYDROCODONE-ACETAMINOPHEN 5-325 MG PO TABS
2.0000 | ORAL_TABLET | Freq: Once | ORAL | Status: AC
  Administered 2014-12-07: 2 via ORAL
  Filled 2014-12-07: qty 2

## 2014-12-07 MED ORDER — CYCLOBENZAPRINE HCL 10 MG PO TABS: 10.0000 mg | ORAL_TABLET | Freq: Two times a day (BID) | ORAL | Status: DC | PRN

## 2014-12-07 MED ORDER — HYDROCODONE-ACETAMINOPHEN 5-325 MG PO TABS: 2.0000 | ORAL_TABLET | ORAL | Status: DC | PRN

## 2014-12-07 NOTE — ED Notes (Signed)
Went back into room to get pt to d/c home.  Pt now states she's ok.  Her pain has decreased and she will go home.  Daughter is pushing for mom to go also and helping RN to get pt to leave.

## 2014-12-07 NOTE — ED Notes (Signed)
Pt asking when MD coming to see patient.  Pt notified that PA is seeing her and he has been into room.  Notified PA that pt asking for him

## 2014-12-07 NOTE — Discharge Instructions (Signed)
Take Vicodin as needed for pain. Take flexeril as needed for muscle pain. Follow up with your primary care provider. Refer to attached documents for more information.

## 2014-12-07 NOTE — ED Notes (Signed)
While obtaining discharge vital signs, pt is still resistant to leave. States that her shoulder hurts and "alot people die from that". Explained and attempted to reaasure pt that we have cleared her medically here today and they can do furthering testing at her PCP.  RN is aware.

## 2014-12-07 NOTE — ED Notes (Addendum)
Pt c/o rt flank pain x 1 hr.  No NVD.

## 2014-12-07 NOTE — ED Provider Notes (Signed)
CSN: [id number]     Arrival date & time 12/07/14  1248 History   First MD Initiated Contact with Patient 12/07/14 1349     Chief Complaint  Patient presents with  . Flank Pain     (Consider location/radiation/quality/duration/timing/severity/associated sxs/prior Treatment) HPI Comments: Patient is a 54 year old female with past medical history of hypertension who presents with abdominal pain that started 3 hours ago. The pain is located in the RUQ and does not radiate. The pain is described as aching and severe. The pain started gradually and progressively worsened since the onset. Eating makes the pain worse. No alleviating factors. The patient has tried nothing for symptoms without relief. Associated symptoms include right shoulder pain that is worse with movement and does not radiate. Patient denies fever, headache, NVD, chest pain, SOB, dysuria, constipation, abnormal vaginal bleeding/discharge. No history of abdominal surgery.      Past Medical History  Diagnosis Date  . SAB (spontaneous abortion)   . Hypertension   . 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   . 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 Transient ischemic attack 2009    x 3   Past Surgical History  Procedure Laterality Date  . Breast surgery     Family History  Problem Relation Age of Onset  . Stroke Mother   . Hypertension Mother   . Diabetes Brother   . Stroke Brother    History  Substance Use Topics  . Smoking status: Never Smoker   . Smokeless tobacco: Never Used  . Alcohol Use: No   OB History    No data available     Review of Systems  Constitutional: Negative for fever, chills and fatigue.  HENT: Negative for trouble swallowing.   Eyes: Negative for visual disturbance.  Respiratory: Negative for shortness of breath.   Cardiovascular: Negative for chest pain and palpitations.  Gastrointestinal: Positive for abdominal pain.  Negative for nausea, vomiting and diarrhea.  Genitourinary: Negative for dysuria and difficulty urinating.  Musculoskeletal: Positive for back pain. Negative for arthralgias and neck pain.  Skin: Negative for color change.  Neurological: Negative for dizziness and weakness.  Psychiatric/Behavioral: Negative for dysphoric mood.      Allergies  Famotidine; Iodine; and Prednisone  Home Medications   Prior to Admission medications   Medication Sig Start Date End Date Taking? Authorizing Provider  calcium-vitamin D (OSCAL WITH D) 500-200 MG-UNIT per tablet Take 1 tablet by mouth daily.   Yes Historical Provider, MD  Cyanocobalamin (VITAMIN B 12) 100 MCG LOZG Take 100 mcg by mouth daily.   Yes Historical Provider, MD  Multiple Vitamin (MULITIVITAMIN WITH MINERALS) TABS Take 1 tablet by mouth daily.   Yes Historical Provider, MD  vitamin C (ASCORBIC ACID) 500 MG tablet Take 1,000 mg by mouth daily.   Yes Historical Provider, MD  HYDROcodone-acetaminophen (NORCO/VICODIN) 5-325 MG per tablet Take 1-2 tablets by mouth every 4 (four) hours as needed for moderate pain or severe pain. Patient not taking: Reported on 12/07/2014 08/10/14   Alvina Chou, PA-C  meloxicam (MOBIC) 15 MG tablet Take 1 tablet (15 mg total) by mouth daily. Patient not taking: Reported on 12/07/2014 08/10/14   Alvina Chou, PA-C  predniSONE (DELTASONE) 20 MG tablet Take 2 tablets (40 mg total) by mouth daily. Patient not taking: Reported on 12/07/2014 02/04/12   Tatyana A Kirichenko, PA-C   BP 145/93 mmHg  Pulse 60  Temp(Src)  98.5 F (36.9 C) (Oral)  Resp 18  SpO2 99% Physical Exam  Constitutional: She is oriented to person, place, and time. She appears well-developed and well-nourished. No distress.  HENT:  Head: Normocephalic and atraumatic.  Eyes: Conjunctivae and EOM are normal.  Neck: Normal range of motion.  Cardiovascular: Normal rate and regular rhythm.  Exam reveals no gallop and no friction rub.   No  murmur heard. Pulmonary/Chest: Effort normal and breath sounds normal. She has no wheezes. She has no rales. She exhibits no tenderness.  Abdominal: Soft. She exhibits no distension. There is no tenderness. There is no rebound.  No focal tenderness to palpation of abdomen.   Musculoskeletal: Normal range of motion.  No midline spine tenderness to palpation. Right trapezius tenderness to palpation.   Neurological: She is alert and oriented to person, place, and time. Coordination normal.  Speech is goal-oriented. Moves limbs without ataxia.   Skin: Skin is warm and dry.  Psychiatric: She has a normal mood and affect. Her behavior is normal.  Nursing note and vitals reviewed.   ED Course  Procedures (including critical care time) Labs Review Labs Reviewed  URINALYSIS, ROUTINE W REFLEX MICROSCOPIC - Abnormal; Notable for the following:    Specific Gravity, Urine 1.004 (*)    All other components within normal limits  CBC WITH DIFFERENTIAL/PLATELET  COMPREHENSIVE METABOLIC PANEL  LIPASE, BLOOD    Imaging Review No results found.   EKG Interpretation None      MDM   Final diagnoses:  RUQ abdominal pain  Trapezius muscle strain, right, initial encounter    3:25 PM  Labs and urinalysis unremarkable for acute changes. Patient has no exquisite abdominal tenderness to palpation. Vitals stable and patient afebrile. Patient will have pain medication and zofran for nausea. Patient instructed to return with worsening or concerning symptoms. Patient instructed to follow up with PCP for recheck and further evaluation.   3:44 PM Patient is upset because no one brought her food or told her why she was having pain. Patient is very upset, and states she will go to [facility] "to get some answers." I explained to the patient we are not concerned about gallbladder or pancreas causing emergent pathology due to normal labs and unremarkable physical exam. Patient has no other concerning signs or  symptoms to warrant further evaluation. I explained the patient will have to follow up with a PCP for further evaluation and instructed her to return to the ED with worsening or concerning symptoms. Patient is unhappy with her care.   Alvina Chou, PA-C 12/07/14 1550  Johnna Acosta, MD 12/07/14 2129

## 2014-12-07 NOTE — ED Notes (Signed)
Went into room to d/c patient.  Pt very upset.  Pt states she is going to [place] to get treatment b/c she is still hurting and we have not done anything to help her.  We haven't given her any answers.  States someone was supposed to bring her food and never did.  Notified PA and asked PA to come to room. PA stated that she had explained that all testing was cleared for emergent reasons for pain. Unsure of pain reason but treating pain with pain meds and flexeril for her shoulder.  PA told pt to follow up with primary but that no indications for further treatment is seen.  Pt again states she is in pain and wants food.  RN able to calm pt.  Gave pt food/drink, work note, pain meds prior to d/c.  Reinforced that abdominal pain doesn't always give a straight answer and that if pain continues, please follow up for further testing.  Daughter is restating what staff is saying and appears frustrated with mother also.  Pt eating now.  Pending finishing meal, vitals and d/c home.  Plan explained.

## 2014-12-07 NOTE — Progress Notes (Signed)
  CARE MANAGEMENT ED NOTE 12/07/2014  Patient:  Debbie Howard, Debbie Howard   Account Number:  [id number]  Date Initiated:  12/07/2014  Documentation initiated by:  Jackelyn Poling  Subjective/Objective Assessment:   89 [employer] [facility] pt c/o flank pain d/c with  pain medication and zofran for nausea. Patient instructed to return with worsening or concerning symptoms. Patient instructed to follow up with PCP for recheck & further eval     Subjective/Objective Assessment Detail:   no pcp for f/u care  During interaction with pt she mumbled responses to CM but was not unpleasant Female family member at the bedside  Denied other questions when Cm asked if pt had further questions     Action/Plan:   ED CM consulted  by ED PA/Np to assist with pcp recommendations for pt CM spoke with pt about conflict of interest to provide pcp recommendations see notes below   Action/Plan Detail:   Anticipated DC Date:  12/07/2014     Status Recommendation to Physician:   Result of Recommendation:    Other ED Services  Consult Working Kay  Other  PCP issues    Choice offered to / List presented to:            Status of service:  Completed, signed off  ED Comments:   ED Comments Detail:  [facility] ED CM spoke with pt on how to obtain an in network pcp with insurance coverage via the customer service number or web site Cm reviewed ED level of care for crisis/emergent services and community pcp level of care to manage continuous or chronic medical concerns.  The pt voiced understanding CM encouraged pt and discussed pt's responsibility to verify with pt's insurance carrier that any recommended medical provider offered by any emergency room or a hospital provider is within the carrier's network. The pt voiced understanding

## 2015-06-05 ENCOUNTER — Encounter (HOSPITAL_COMMUNITY): Payer: Self-pay | Admitting: Emergency Medicine

## 2015-06-05 DIAGNOSIS — R42 Dizziness and giddiness: Secondary | ICD-10-CM | POA: Diagnosis present

## 2015-06-05 DIAGNOSIS — Z8673 Personal history of transient ischemic attack (TIA), and cerebral infarction without residual deficits: Secondary | ICD-10-CM | POA: Insufficient documentation

## 2015-06-05 DIAGNOSIS — I10 Essential (primary) hypertension: Secondary | ICD-10-CM | POA: Diagnosis not present

## 2015-06-05 DIAGNOSIS — Z86011 Personal history of benign neoplasm of the brain: Secondary | ICD-10-CM | POA: Insufficient documentation

## 2015-06-05 DIAGNOSIS — R0602 Shortness of breath: Secondary | ICD-10-CM | POA: Diagnosis not present

## 2015-06-05 DIAGNOSIS — Z7982 Long term (current) use of aspirin: Secondary | ICD-10-CM | POA: Diagnosis not present

## 2015-06-05 LAB — BASIC METABOLIC PANEL
Anion gap: 10 (ref 5–15)
BUN: 8 mg/dL (ref 6–20)
CO2: 24 mmol/L (ref 22–32)
Calcium: 9.2 mg/dL (ref 8.9–10.3)
Chloride: 106 mmol/L (ref 101–111)
Creatinine, Ser: 0.69 mg/dL (ref 0.44–1.00)
GFR calc Af Amer: >60 mL/min (ref >60)
GFR calc non Af Amer: >60 mL/min (ref >60)
GLUCOSE: 134 mg/dL — AB (ref 65–99)
Potassium: 3.7 mmol/L (ref 3.5–5.1)
Sodium: 140 mmol/L (ref 135–145)

## 2015-06-05 LAB — URINALYSIS, ROUTINE W REFLEX MICROSCOPIC
Bilirubin Urine: NEGATIVE
Glucose, UA: NEGATIVE mg/dL
Hgb urine dipstick: NEGATIVE
Ketones, ur: NEGATIVE mg/dL
Nitrite: NEGATIVE
PROTEIN: NEGATIVE mg/dL
Specific Gravity, Urine: 1.012 (ref 1.005–1.030)
Urobilinogen, UA: 0.2 mg/dL (ref 0.0–1.0)
pH: 5.5 (ref 5.0–8.0)

## 2015-06-05 LAB — CBC
HCT: 38.6 % (ref 36.0–46.0)
Hemoglobin: 13.1 g/dL (ref 12.0–15.0)
MCH: 29 pg (ref 26.0–34.0)
MCHC: 33.9 g/dL (ref 30.0–36.0)
MCV: 85.6 fL (ref 78.0–100.0)
Platelets: 260 10*3/uL (ref 150–400)
RBC: 4.51 MIL/uL (ref 3.87–5.11)
RDW: 12.8 % (ref 11.5–15.5)
WBC: 5 10*3/uL (ref 4.0–10.5)

## 2015-06-05 LAB — CBG MONITORING, ED: Glucose-Capillary: 95 mg/dL (ref 65–99)

## 2015-06-05 LAB — I-STAT TROPONIN, ED: Troponin i, poc: 0 ng/mL (ref 0.00–0.08)

## 2015-06-05 LAB — URINE MICROSCOPIC-ADD ON

## 2015-06-05 NOTE — ED Notes (Signed)
Pt reports dizziness with hypertension x 2 days. Also endorses slight sob. Denies cp. Pt no longer takes medications for bp (since 2009).

## 2015-06-06 ENCOUNTER — Emergency Department (HOSPITAL_COMMUNITY)
Admission: EM | Admit: 2015-06-06 | Discharge: 2015-06-06 | Discharge status: Against Medical Advice | Payer: Commercial Managed Care - HMO | Attending: Emergency Medicine | Admitting: Emergency Medicine

## 2015-06-06 DIAGNOSIS — R42 Dizziness and giddiness: Secondary | ICD-10-CM

## 2015-06-06 NOTE — ED Provider Notes (Signed)
CSN: [id number]     Arrival date & time 06/05/15  2137 History  This chart was scribed for Evelina Bucy, MD by Randa Evens, ED Scribe. This patient was seen in room A12C/A12C and the patient's care was started at 12:56 AM.     Chief Complaint  Patient presents with  . Hypertension  . Dizziness   Patient is a 54 y.o. female presenting with dizziness. The history is provided by the patient. No language interpreter was used.  Dizziness Quality:  Lightheadedness Severity:  Mild Onset quality:  Gradual Duration:  2 days Timing:  Sporadic Chronicity:  New Context: not with loss of consciousness   Relieved by:  None tried Ineffective treatments:  None tried Associated symptoms: shortness of breath   Associated symptoms: no chest pain, no diarrhea, no headaches, no nausea and no vomiting   Risk factors: hx of stroke    HPI Comments: RAGNA KRAMLICH is a 54 y.o. female who presents to the Emergency Department complaining of sporadic dizziness onset 2 days prior that she describes as light headedness and last for only a few seconds at a time. She states she has associated intermittent SOB. Pt reports that she has had a poor diet recently. Pt states she has not been on any HTN medication since 2009. She states she was able to control her BP with diet. Pt denies HA, CP, LOC, dysuria, abdominal pain, n/v/d. Pt denies any recent travel or sick contacts.    Past Medical History  Diagnosis Date  . SAB (spontaneous abortion)   . Hypertension   . 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   . 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 Transient ischemic attack 2009    x 3   Past Surgical History  Procedure Laterality Date  . Breast surgery     Family History  Problem Relation Age of Onset  . Stroke Mother   . Hypertension Mother   . Diabetes Brother   . Stroke Brother    History  Substance Use Topics  . Smoking status:  Never Smoker   . Smokeless tobacco: Never Used  . Alcohol Use: No   OB History    No data available      Review of Systems  Respiratory: Positive for shortness of breath.   Cardiovascular: Negative for chest pain.  Gastrointestinal: Negative for nausea, vomiting, abdominal pain and diarrhea.  Genitourinary: Negative for dysuria.  Neurological: Positive for dizziness. Negative for headaches.  All other systems reviewed and are negative.    Allergies  Famotidine; Prednisone; and Iodine  Home Medications   Prior to Admission medications   Medication Sig Start Date End Date Taking? Authorizing Provider  aspirin EC 81 MG tablet Take 81 mg by mouth every other day.   Yes Historical Provider, MD  bismuth subsalicylate (PEPTO BISMOL) 262 MG/15ML suspension Take 30 mLs by mouth every 6 (six) hours as needed for indigestion.   Yes Historical Provider, MD   BP 123/86 mmHg  Pulse 36  Temp(Src) 98.3 F (36.8 C) (Oral)  Resp 18  SpO2 100%   Physical Exam  Constitutional: She is oriented to person, place, and time. She appears well-developed and well-nourished. No distress.  HENT:  Head: Normocephalic and atraumatic.  Mouth/Throat: Oropharynx is clear and moist.  Eyes: EOM are normal. Pupils are equal, round, and reactive to light.  Neck: Normal range of motion. Neck supple.  Cardiovascular: Normal  rate and regular rhythm.  Exam reveals no friction rub.   No murmur heard. Pulmonary/Chest: Effort normal and breath sounds normal. No respiratory distress. She has no wheezes. She has no rales.  Abdominal: Soft. She exhibits no distension. There is no tenderness. There is no rebound.  Musculoskeletal: Normal range of motion. She exhibits no edema.  Neurological: She is alert and oriented to person, place, and time. No cranial nerve deficit or sensory deficit. She exhibits normal muscle tone. Coordination and gait normal. GCS eye subscore is 4. GCS verbal subscore is 5. GCS motor subscore  is 6.  Skin: She is not diaphoretic.  Nursing note and vitals reviewed.   ED Course  Procedures (including critical care time) DIAGNOSTIC STUDIES: Oxygen Saturation is 100% on RA, normal by my interpretation.    COORDINATION OF CARE: 1:10 AM-Discussed treatment plan with pt at bedside and pt agreed to plan.     Labs Review Labs Reviewed  BASIC METABOLIC PANEL - Abnormal; Notable for the following:    Glucose, Bld 134 (*)    All other components within normal limits  URINALYSIS, ROUTINE W REFLEX MICROSCOPIC (NOT AT [facility]) - Abnormal; Notable for the following:    APPearance CLOUDY (*)    Leukocytes, UA SMALL (*)    All other components within normal limits  CBC  URINE MICROSCOPIC-ADD ON  CBG MONITORING, ED  I-STAT TROPOININ, ED    Imaging Review No results found.   EKG Interpretation   Date/Time:  Monday June 05 2015 21:47:42 EDT Ventricular Rate:  75 PR Interval:  142 QRS Duration: 88 QT Interval:  414 QTC Calculation: 462 R Axis:   28 Text Interpretation:  Sinus rhythm with frequent Premature ventricular  complexes Otherwise normal ECG No significant change since last tracing  Confirmed by Mingo Amber  MD, Pratt ([id number]) on 06/06/2015 12:42:14 AM      MDM   Final diagnoses:  Dizziness   54 year old female here with dizziness. Present for a few seconds sporadically for the past few days. No correlation with activity. No near-syncope or true syncope. She denies any rotational dizziness. Dizziness is is described as wooziness. Denies chest pain, fever, shortness of breath, nausea, vomiting, diarrhea. No recent illnesses. Here she is neurologically intact and well appearing. Will check orthostatics. Labs are normal at temperature checked in triage. With her fleeting episodes of dizziness, I'm not overly concerned for acute neurologic problem here. Patient eloped prior to orthostatics.  I personally performed the services described in this documentation, which was scribed  in my presence. The recorded information has been reviewed and is accurate.       Evelina Bucy, MD 06/06/15 [phone]

## 2015-06-06 NOTE — ED Notes (Signed)
Pt requesting HTN medications- MD made aware; MD sts he already had discussion with patient why he would not prescribe her HTN medication; MD also sts he will talk to patient again

## 2015-06-07 LAB — URINE CULTURE
Culture: NO GROWTH
Special Requests: NORMAL

## 2017-04-09 ENCOUNTER — Encounter (HOSPITAL_COMMUNITY): Payer: Self-pay | Admitting: Emergency Medicine

## 2017-04-09 ENCOUNTER — Emergency Department (HOSPITAL_COMMUNITY)
Admission: EM | Admit: 2017-04-09 | Discharge: 2017-04-09 | Disposition: A | Discharge status: Home / Self Care | Payer: 59 | Attending: Emergency Medicine | Admitting: Emergency Medicine

## 2017-04-09 ENCOUNTER — Emergency Department (HOSPITAL_COMMUNITY): Payer: 59

## 2017-04-09 DIAGNOSIS — R0789 Other chest pain: Secondary | ICD-10-CM | POA: Insufficient documentation

## 2017-04-09 DIAGNOSIS — Z5321 Procedure and treatment not carried out due to patient leaving prior to being seen by health care provider: Secondary | ICD-10-CM | POA: Insufficient documentation

## 2017-04-09 LAB — CBC
HEMATOCRIT: 42.8 % (ref 36.0–46.0)
Hemoglobin: 13.8 g/dL (ref 12.0–15.0)
MCH: 28.5 pg (ref 26.0–34.0)
MCHC: 32.2 g/dL (ref 30.0–36.0)
MCV: 88.4 fL (ref 78.0–100.0)
Platelets: 269 10*3/uL (ref 150–400)
RBC: 4.84 MIL/uL (ref 3.87–5.11)
RDW: 13 % (ref 11.5–15.5)
WBC: 3.9 10*3/uL — ABNORMAL LOW (ref 4.0–10.5)

## 2017-04-09 LAB — I-STAT TROPONIN, ED: TROPONIN I, POC: 0 ng/mL (ref 0.00–0.08)

## 2017-04-09 LAB — BASIC METABOLIC PANEL
ANION GAP: 9 (ref 5–15)
BUN: 10 mg/dL (ref 6–20)
CALCIUM: 9.5 mg/dL (ref 8.9–10.3)
CHLORIDE: 105 mmol/L (ref 101–111)
CO2: 26 mmol/L (ref 22–32)
Creatinine, Ser: 0.74 mg/dL (ref 0.44–1.00)
GFR calc non Af Amer: >60 mL/min (ref >60)
GLUCOSE: 95 mg/dL (ref 65–99)
POTASSIUM: 3.9 mmol/L (ref 3.5–5.1)
Sodium: 140 mmol/L (ref 135–145)

## 2017-04-09 NOTE — ED Notes (Signed)
Pt asked about wait time, informed how many patients were ahead of her but explained that it is based off of acuity; pt stated that she could not wait any longer and proceeded to leave the waiting room with steady gait
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
## 2017-04-09 NOTE — ED Notes (Signed)
Pt approached this EMT asking how much longer the wait would be for her to see a provider. Pt was made aware of longest wait time currently, and pt became agitated and left the ED without saying anything further.

## 2017-04-11 ENCOUNTER — Emergency Department (HOSPITAL_COMMUNITY)
Admission: EM | Admit: 2017-04-11 | Discharge: 2017-04-12 | Disposition: A | Discharge status: Home / Self Care | Payer: Self-pay | Attending: Emergency Medicine | Admitting: Emergency Medicine

## 2017-04-11 ENCOUNTER — Encounter (HOSPITAL_COMMUNITY): Payer: Self-pay | Admitting: Emergency Medicine

## 2017-04-11 ENCOUNTER — Emergency Department (HOSPITAL_COMMUNITY): Payer: Self-pay

## 2017-04-11 ENCOUNTER — Ambulatory Visit (HOSPITAL_COMMUNITY)
Admission: EM | Admit: 2017-04-11 | Discharge: 2017-04-11 | Disposition: A | Discharge status: Other Acute Inpatient Hospital | Payer: 59

## 2017-04-11 ENCOUNTER — Other Ambulatory Visit: Payer: Self-pay

## 2017-04-11 DIAGNOSIS — Z79899 Other long term (current) drug therapy: Secondary | ICD-10-CM | POA: Insufficient documentation

## 2017-04-11 DIAGNOSIS — I10 Essential (primary) hypertension: Secondary | ICD-10-CM | POA: Insufficient documentation

## 2017-04-11 DIAGNOSIS — Z7982 Long term (current) use of aspirin: Secondary | ICD-10-CM | POA: Insufficient documentation

## 2017-04-11 DIAGNOSIS — R0789 Other chest pain: Secondary | ICD-10-CM

## 2017-04-11 DIAGNOSIS — R911 Solitary pulmonary nodule: Secondary | ICD-10-CM | POA: Insufficient documentation

## 2017-04-11 DIAGNOSIS — Z8673 Personal history of transient ischemic attack (TIA), and cerebral infarction without residual deficits: Secondary | ICD-10-CM | POA: Insufficient documentation

## 2017-04-11 LAB — CBC
HCT: 39.3 % (ref 36.0–46.0)
Hemoglobin: 13 g/dL (ref 12.0–15.0)
MCH: 29 pg (ref 26.0–34.0)
MCHC: 33.1 g/dL (ref 30.0–36.0)
MCV: 87.5 fL (ref 78.0–100.0)
PLATELETS: 236 10*3/uL (ref 150–400)
RBC: 4.49 MIL/uL (ref 3.87–5.11)
RDW: 12.7 % (ref 11.5–15.5)
WBC: 3.3 10*3/uL — AB (ref 4.0–10.5)

## 2017-04-11 LAB — BASIC METABOLIC PANEL
ANION GAP: 8 (ref 5–15)
BUN: 13 mg/dL (ref 6–20)
CALCIUM: 9 mg/dL (ref 8.9–10.3)
CO2: 24 mmol/L (ref 22–32)
CREATININE: 0.78 mg/dL (ref 0.44–1.00)
Chloride: 107 mmol/L (ref 101–111)
GFR calc Af Amer: >60 mL/min (ref >60)
Glucose, Bld: 116 mg/dL — ABNORMAL HIGH (ref 65–99)
Potassium: 3.5 mmol/L (ref 3.5–5.1)
SODIUM: 139 mmol/L (ref 135–145)

## 2017-04-11 LAB — I-STAT TROPONIN, ED
TROPONIN I, POC: 0 ng/mL (ref 0.00–0.08)
Troponin i, poc: 0 ng/mL (ref 0.00–0.08)
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
## 2017-04-11 NOTE — ED Notes (Signed)
Pt  Reports  Chest  Pain   With  Pain  Radiating  Down  l   Arm        Pt  evaulated  By  Dietrich Pates   And   Sent  To  [facility]
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
## 2017-04-12 LAB — TSH: TSH: 2.452 u[IU]/mL (ref 0.350–4.500)

## 2017-04-12 LAB — MAGNESIUM: Magnesium: 2.1 mg/dL (ref 1.7–2.4)

## 2017-04-12 NOTE — Discharge Instructions (Signed)
Please follow up with your primary care doctor to get scheduled for a CT scan. Your labs showed no significant abnormalities.  Your caregiver has diagnosed you as having chest pain that is not specific for one problem, but does not require admission.  You are at low risk for an acute heart condition or other serious illness. Chest pain comes from many different causes.  SEEK IMMEDIATE MEDICAL ATTENTION IF: You have severe chest pain, especially if the pain is crushing or pressure-like and spreads to the arms, back, neck, or jaw, or if you have sweating, nausea (feeling sick to your stomach), or shortness of breath. THIS IS AN EMERGENCY. Don't wait to see if the pain will go away. Get medical help at once. Call 911 or 0 (operator). DO NOT drive yourself to the hospital.  Your chest pain gets worse and does not go away with rest.  You have an attack of chest pain lasting longer than usual, despite rest and treatment with the medications your caregiver has prescribed.  You wake from sleep with chest pain or shortness of breath.  You feel dizzy or faint.  You have chest pain not typical of your usual pain for which you originally saw your caregiver.

## 2018-11-07 ENCOUNTER — Emergency Department (HOSPITAL_COMMUNITY): Payer: BLUE CROSS/BLUE SHIELD

## 2018-11-07 ENCOUNTER — Emergency Department (HOSPITAL_COMMUNITY)
Admission: EM | Admit: 2018-11-07 | Discharge: 2018-11-07 | Disposition: A | Discharge status: Home / Self Care | Payer: BLUE CROSS/BLUE SHIELD | Attending: Emergency Medicine | Admitting: Emergency Medicine

## 2018-11-07 ENCOUNTER — Other Ambulatory Visit: Payer: Self-pay

## 2018-11-07 ENCOUNTER — Encounter (HOSPITAL_COMMUNITY): Payer: Self-pay | Admitting: *Deleted

## 2018-11-07 DIAGNOSIS — Y92410 Unspecified street and highway as the place of occurrence of the external cause: Secondary | ICD-10-CM | POA: Insufficient documentation

## 2018-11-07 DIAGNOSIS — S161XXA Strain of muscle, fascia and tendon at neck level, initial encounter: Secondary | ICD-10-CM

## 2018-11-07 DIAGNOSIS — S301XXA Contusion of abdominal wall, initial encounter: Secondary | ICD-10-CM | POA: Insufficient documentation

## 2018-11-07 DIAGNOSIS — S8012XA Contusion of left lower leg, initial encounter: Secondary | ICD-10-CM

## 2018-11-07 DIAGNOSIS — Z7982 Long term (current) use of aspirin: Secondary | ICD-10-CM | POA: Diagnosis not present

## 2018-11-07 DIAGNOSIS — R51 Headache: Secondary | ICD-10-CM | POA: Diagnosis not present

## 2018-11-07 DIAGNOSIS — K449 Diaphragmatic hernia without obstruction or gangrene: Secondary | ICD-10-CM

## 2018-11-07 DIAGNOSIS — I10 Essential (primary) hypertension: Secondary | ICD-10-CM | POA: Diagnosis not present

## 2018-11-07 DIAGNOSIS — Y9389 Activity, other specified: Secondary | ICD-10-CM | POA: Diagnosis not present

## 2018-11-07 DIAGNOSIS — S20219A Contusion of unspecified front wall of thorax, initial encounter: Secondary | ICD-10-CM | POA: Diagnosis not present

## 2018-11-07 DIAGNOSIS — S3011XA Contusion of abdominal wall, initial encounter: Secondary | ICD-10-CM

## 2018-11-07 DIAGNOSIS — Z8673 Personal history of transient ischemic attack (TIA), and cerebral infarction without residual deficits: Secondary | ICD-10-CM | POA: Insufficient documentation

## 2018-11-07 DIAGNOSIS — Y998 Other external cause status: Secondary | ICD-10-CM | POA: Diagnosis not present

## 2018-11-07 DIAGNOSIS — S199XXA Unspecified injury of neck, initial encounter: Secondary | ICD-10-CM | POA: Diagnosis present

## 2018-11-07 LAB — CBC WITH DIFFERENTIAL/PLATELET
Abs Immature Granulocytes: 0.01 10*3/uL (ref 0.00–0.07)
BASOS ABS: 0 10*3/uL (ref 0.0–0.1)
BASOS PCT: 1 %
Eosinophils Absolute: 0.1 10*3/uL (ref 0.0–0.5)
Eosinophils Relative: 3 %
HEMATOCRIT: 46.6 % — AB (ref 36.0–46.0)
HEMOGLOBIN: 14.5 g/dL (ref 12.0–15.0)
Immature Granulocytes: 0 %
Lymphocytes Relative: 39 %
Lymphs Abs: 1.5 10*3/uL (ref 0.7–4.0)
MCH: 28.8 pg (ref 26.0–34.0)
MCHC: 31.1 g/dL (ref 30.0–36.0)
MCV: 92.5 fL (ref 80.0–100.0)
MONO ABS: 0.4 10*3/uL (ref 0.1–1.0)
Monocytes Relative: 10 %
NEUTROS ABS: 1.8 10*3/uL (ref 1.7–7.7)
NEUTROS PCT: 47 %
NRBC: 0 % (ref 0.0–0.2)
Platelets: 259 10*3/uL (ref 150–400)
RBC: 5.04 MIL/uL (ref 3.87–5.11)
RDW: 13.1 % (ref 11.5–15.5)
WBC: 3.8 10*3/uL — AB (ref 4.0–10.5)

## 2018-11-07 LAB — LIPASE, BLOOD: Lipase: 44 U/L (ref 11–51)

## 2018-11-07 LAB — COMPREHENSIVE METABOLIC PANEL
ALT: 21 U/L (ref 0–44)
ANION GAP: 8 (ref 5–15)
AST: 15 U/L (ref 15–41)
Albumin: 4.4 g/dL (ref 3.5–5.0)
Alkaline Phosphatase: 71 U/L (ref 38–126)
BILIRUBIN TOTAL: 0.6 mg/dL (ref 0.3–1.2)
BUN: 14 mg/dL (ref 6–20)
CHLORIDE: 107 mmol/L (ref 98–111)
CO2: 26 mmol/L (ref 22–32)
Calcium: 9.3 mg/dL (ref 8.9–10.3)
Creatinine, Ser: 0.7 mg/dL (ref 0.44–1.00)
Glucose, Bld: 87 mg/dL (ref 70–99)
POTASSIUM: 3.5 mmol/L (ref 3.5–5.1)
Sodium: 141 mmol/L (ref 135–145)
TOTAL PROTEIN: 8 g/dL (ref 6.5–8.1)

## 2018-11-07 LAB — URINALYSIS, ROUTINE W REFLEX MICROSCOPIC
Bilirubin Urine: NEGATIVE
Glucose, UA: NEGATIVE mg/dL
Hgb urine dipstick: NEGATIVE
KETONES UR: NEGATIVE mg/dL
LEUKOCYTES UA: NEGATIVE
NITRITE: NEGATIVE
PH: 7 (ref 5.0–8.0)
Protein, ur: NEGATIVE mg/dL
Specific Gravity, Urine: 1.003 — ABNORMAL LOW (ref 1.005–1.030)

## 2018-11-07 MED ORDER — MORPHINE SULFATE (PF) 4 MG/ML IV SOLN
4.0000 mg | Freq: Once | INTRAVENOUS | Status: DC
  Filled 2018-11-07: qty 1

## 2018-11-07 MED ORDER — ONDANSETRON HCL 4 MG/2ML IJ SOLN
4.0000 mg | Freq: Once | INTRAMUSCULAR | Status: DC
  Filled 2018-11-07: qty 2

## 2018-11-07 MED ORDER — IBUPROFEN 200 MG PO TABS
600.0000 mg | ORAL_TABLET | Freq: Once | ORAL | Status: AC
  Administered 2018-11-07: 600 mg via ORAL
  Filled 2018-11-07: qty 3

## 2018-11-07 MED ORDER — CYCLOBENZAPRINE HCL 10 MG PO TABS: 10.0000 mg | ORAL_TABLET | Freq: Three times a day (TID) | ORAL | 0 refills | Status: DC | PRN

## 2018-11-07 MED ORDER — CYCLOBENZAPRINE HCL 10 MG PO TABS
10.0000 mg | ORAL_TABLET | Freq: Once | ORAL | Status: AC
  Administered 2018-11-07: 10 mg via ORAL
  Filled 2018-11-07: qty 1
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
## 2018-11-07 NOTE — ED Notes (Signed)
Pt transported to radiology for CT scans and X-ray.

## 2018-11-07 NOTE — ED Notes (Signed)
This RN went in to introduce self to pt and tell pt that this RN had pain medication to give pt after we got done with triage and started an IV.  Pt requested daughter to be called and this RN was going to call after triage.  In the middle of finishing triage, pt got angry and started looking through her bag to look for her phone.  This RN attempted to help her find her phone but pt refused help.  This RN also stated that her daughter's number was in her chart and offered to help call her.  The pt refused.  Currently the pt has refused all medical treatment until a family member is present at bedside.

## 2018-12-05 ENCOUNTER — Encounter (HOSPITAL_COMMUNITY): Payer: Self-pay | Admitting: *Deleted

## 2018-12-05 ENCOUNTER — Ambulatory Visit (HOSPITAL_COMMUNITY)
Admission: EM | Admit: 2018-12-05 | Discharge: 2018-12-05 | Disposition: A | Discharge status: Home / Self Care | Payer: BLUE CROSS/BLUE SHIELD | Attending: Physician Assistant | Admitting: Physician Assistant

## 2018-12-05 DIAGNOSIS — M7918 Myalgia, other site: Secondary | ICD-10-CM | POA: Diagnosis not present

## 2018-12-05 DIAGNOSIS — R51 Headache: Secondary | ICD-10-CM | POA: Diagnosis not present

## 2018-12-05 DIAGNOSIS — I10 Essential (primary) hypertension: Secondary | ICD-10-CM

## 2018-12-05 DIAGNOSIS — Z760 Encounter for issue of repeat prescription: Secondary | ICD-10-CM

## 2018-12-05 DIAGNOSIS — M79602 Pain in left arm: Secondary | ICD-10-CM

## 2018-12-05 DIAGNOSIS — R519 Headache, unspecified: Secondary | ICD-10-CM

## 2018-12-05 HISTORY — DX: Cerebral infarction, unspecified: I63.9

## 2018-12-05 MED ORDER — CYCLOBENZAPRINE HCL 10 MG PO TABS: 10.0000 mg | ORAL_TABLET | Freq: Three times a day (TID) | ORAL | 0 refills | Status: DC | PRN

## 2018-12-05 NOTE — ED Notes (Signed)
Pt called from waiting area without response. 

## 2018-12-05 NOTE — Discharge Instructions (Addendum)
Please follow up in the ED for stroke like symptoms
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
## 2018-12-05 NOTE — ED Triage Notes (Signed)
Also c/o left arm pain and nausea since incident.
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
## 2019-01-04 ENCOUNTER — Other Ambulatory Visit: Payer: Self-pay

## 2019-01-04 ENCOUNTER — Encounter (HOSPITAL_COMMUNITY): Payer: Self-pay | Admitting: Emergency Medicine

## 2019-01-04 ENCOUNTER — Ambulatory Visit (HOSPITAL_COMMUNITY)
Admission: EM | Admit: 2019-01-04 | Discharge: 2019-01-04 | Disposition: A | Discharge status: Home / Self Care | Payer: BLUE CROSS/BLUE SHIELD | Attending: Family Medicine | Admitting: Family Medicine

## 2019-01-04 DIAGNOSIS — R002 Palpitations: Secondary | ICD-10-CM | POA: Diagnosis not present
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
## 2019-01-04 NOTE — Discharge Instructions (Signed)
Debbie Howard [phone]
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
## 2019-01-05 ENCOUNTER — Ambulatory Visit (HOSPITAL_COMMUNITY)
Admission: EM | Admit: 2019-01-05 | Discharge: 2019-01-05 | Disposition: A | Discharge status: Home / Self Care | Payer: BLUE CROSS/BLUE SHIELD | Attending: Family Medicine | Admitting: Family Medicine

## 2019-01-05 ENCOUNTER — Encounter (HOSPITAL_COMMUNITY): Payer: Self-pay

## 2019-01-05 DIAGNOSIS — R197 Diarrhea, unspecified: Secondary | ICD-10-CM | POA: Diagnosis not present

## 2019-01-05 DIAGNOSIS — R110 Nausea: Secondary | ICD-10-CM

## 2019-01-05 MED ORDER — ONDANSETRON 4 MG PO TBDP: 4.0000 mg | ORAL_TABLET | Freq: Three times a day (TID) | ORAL | 0 refills | Status: DC | PRN

## 2019-01-05 NOTE — ED Provider Notes (Signed)
[place]   [id number] 01/05/19 Arrival Time: [place]:  1. Diarrhea of presumed infectious origin   2. Nausea    No signs of dehydration requiring IVF at this time.  Meds ordered this encounter  Medications  . ondansetron (ZOFRAN-ODT) 4 MG disintegrating tablet    Sig: Take 1 tablet (4 mg total) by mouth every 8 (eight) hours as needed for nausea or vomiting.    Dispense:  15 tablet    Refill:  0   Discussed typical duration of symptoms for suspected viral GI illness. Will do her best to ensure adequate fluid intake in order to avoid dehydration. Will proceed to the Emergency Department for evaluation if unable to tolerate PO fluids regularly.  Otherwise she will f/u with her PCP or here if not showing improvement over the next 48-72 hours.  Reviewed expectations re: course of current medical issues. Questions answered. Outlined signs and symptoms indicating need for more acute intervention. Patient verbalized understanding. After Visit Summary given.   SUBJECTIVE: History from: patient.  Debbie Howard is a 58 y.o. female who presents with complaint of non-bilious, non-bloody intermittent nausea in the absence of vomiting with non-bloody diarrhea. Onset today. Abdominal discomfort: mild and cramping. Symptoms are unchanged since beginning. Aggravating factors: eating. Alleviating factors: none. Associated symptoms: fatigue. She denies chills, fever and headache. Appetite: decreased. PO intake: decreased. Ambulatory without assistance. Urinary symptoms: none. Sick contacts: none. Recent travel or camping: none. OTC treatment: none.  No LMP recorded. Patient is postmenopausal.  Past Surgical History:  Procedure Laterality Date  . BREAST SURGERY      ROS: As per HPI.  OBJECTIVE:  Vitals:   01/05/19 1908  BP: (!) 156/84  Pulse: 71  Resp: 18  Temp: (!) 97.1 F (36.2 C)  TempSrc: Oral  SpO2: 100%    General appearance: alert; no  distress Oropharynx: moist Lungs: clear to auscultation bilaterally; unlabored Heart: regular rate and rhythm Abdomen: soft; non-distended; no significant abdominal tenderness; reports "cramping" feeling; bowel sounds present; no masses or organomegaly; no guarding or rebound tenderness Back: no CVA tenderness Extremities: no edema; symmetrical with no gross deformities Skin: warm; dry Neurologic: normal gait Psychological: alert and cooperative; normal mood and affect   Allergies  Allergen Reactions  . Famotidine Swelling    REACTION: (per pt)  . Prednisone Hives and Swelling  . Iodine Hives and Rash                                               Past Medical History:  Diagnosis Date  . CVA (cerebral infarction) 2009   Right frontal lobe precentral gyrus infarct  . Hypertension   . Meningioma ([place]) 2009    Probable right anterior frontal meningioma.   . SAB (spontaneous abortion)   . Stroke ([place])   . Transient ischemic attack 2009   x 3   Social History   Socioeconomic History  . Marital status: Single    Spouse name: Not on file  . Number of children: Not on file  . Years of education: Not on file  . Highest education level: Not on file  Occupational History  . Not on file  Social Needs  . Financial resource strain: Not on file  . Food insecurity:    Worry: Not on file    Inability: Not on file  .  Transportation needs:    Medical: Not on file    Non-medical: Not on file  Tobacco Use  . Smoking status: Never Smoker  . Smokeless tobacco: Never Used  Substance and Sexual Activity  . Alcohol use: No  . Drug use: No  . Sexual activity: Not on file  Lifestyle  . Physical activity:    Days per week: Not on file    Minutes per session: Not on file  . Stress: Not on file  Relationships  . Social connections:    Talks on phone: Not on file    Gets together: Not on file    Attends religious service: Not on file    Active member of club or organization: Not on  file    Attends meetings of clubs or organizations: Not on file    Relationship status: Not on file  . Intimate partner violence:    Fear of current or ex partner: Not on file    Emotionally abused: Not on file    Physically abused: Not on file    Forced sexual activity: Not on file  Other Topics Concern  . Not on file  Social History Narrative   Pt works at a [employer]   Family History  Problem Relation Age of Onset  . Stroke Mother   . Hypertension Mother   . Diabetes Brother   . Stroke Rochel Brome, MD 01/06/19 [phone]

## 2019-01-05 NOTE — Discharge Instructions (Addendum)
Please do your best to ensure adequate fluid intake in order to avoid dehydration. If you find that you are unable to tolerate drinking fluids regularly please proceed to the Emergency Department for evaluation.  Also, you should return to the hospital if you experience persistent fevers for greater than 1-2 more days, increasing abdominal pain that persists despite medications, persistent diarrhea, dizziness, syncope (fainting), or for any other concerns you may find worrisome.  

## 2019-01-05 NOTE — ED Triage Notes (Signed)
Pt c/o diarrhea x3 since this am with abdominal cramping

## 2019-01-09 ENCOUNTER — Encounter (HOSPITAL_COMMUNITY): Payer: Self-pay | Admitting: Family Medicine

## 2019-01-09 ENCOUNTER — Ambulatory Visit (HOSPITAL_COMMUNITY)
Admission: EM | Admit: 2019-01-09 | Discharge: 2019-01-09 | Disposition: A | Discharge status: Home / Self Care | Payer: BLUE CROSS/BLUE SHIELD | Attending: Family Medicine | Admitting: Family Medicine

## 2019-01-09 DIAGNOSIS — R0789 Other chest pain: Secondary | ICD-10-CM | POA: Diagnosis not present

## 2019-01-09 DIAGNOSIS — F410 Panic disorder [episodic paroxysmal anxiety] without agoraphobia: Secondary | ICD-10-CM

## 2019-01-09 MED ORDER — FLUOXETINE HCL 10 MG PO TABS: 10.0000 mg | ORAL_TABLET | Freq: Every day | ORAL | 3 refills | Status: DC
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
## 2019-01-09 NOTE — Discharge Instructions (Signed)
Debbie Howard [phone]

## 2019-07-25 ENCOUNTER — Ambulatory Visit (HOSPITAL_COMMUNITY)
Admission: EM | Admit: 2019-07-25 | Discharge: 2019-07-25 | Disposition: A | Discharge status: Home / Self Care | Payer: BLUE CROSS/BLUE SHIELD

## 2019-07-25 ENCOUNTER — Other Ambulatory Visit: Payer: Self-pay

## 2019-07-25 ENCOUNTER — Encounter (HOSPITAL_COMMUNITY): Payer: Self-pay | Admitting: *Deleted

## 2019-07-25 DIAGNOSIS — F411 Generalized anxiety disorder: Secondary | ICD-10-CM | POA: Diagnosis not present

## 2019-07-25 HISTORY — DX: Panic disorder (episodic paroxysmal anxiety): F41.0

## 2019-07-25 MED ORDER — HYDROXYZINE HCL 25 MG PO TABS: 25.0000 mg | ORAL_TABLET | Freq: Four times a day (QID) | ORAL | 0 refills | Status: DC
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
## 2019-07-25 NOTE — Discharge Instructions (Addendum)
Take the prescribed hydroxyzine as needed for your anxiety.  Do not drive, operate machinery, or drink alcohol with this medication as it may cause drowsiness.    Call to schedule an appointment with your primary care provider or the one listed below in the morning.
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
## 2019-07-27 ENCOUNTER — Telehealth: Payer: Self-pay | Admitting: General Practice

## 2019-07-27 NOTE — Telephone Encounter (Signed)
LVM for patient to return call, for patient to return call and speak to  Susette Racer to schedule appt for monday

## 2019-07-27 NOTE — Telephone Encounter (Signed)
Pt has [facility]; didn't want to schedule and be responsible  for the bill

## 2019-07-28 ENCOUNTER — Ambulatory Visit: Payer: BLUE CROSS/BLUE SHIELD

## 2019-10-13 ENCOUNTER — Encounter (HOSPITAL_COMMUNITY): Payer: Self-pay

## 2019-10-13 ENCOUNTER — Ambulatory Visit (HOSPITAL_COMMUNITY)
Admission: EM | Admit: 2019-10-13 | Discharge: 2019-10-13 | Disposition: A | Discharge status: Home / Self Care | Payer: BLUE CROSS/BLUE SHIELD | Attending: Family Medicine | Admitting: Family Medicine

## 2019-10-13 DIAGNOSIS — I10 Essential (primary) hypertension: Secondary | ICD-10-CM | POA: Diagnosis present

## 2019-10-13 LAB — BASIC METABOLIC PANEL
Anion gap: 9 (ref 5–15)
BUN: 15 mg/dL (ref 6–20)
CO2: 26 mmol/L (ref 22–32)
Calcium: 9.4 mg/dL (ref 8.9–10.3)
Chloride: 106 mmol/L (ref 98–111)
Creatinine, Ser: 0.77 mg/dL (ref 0.44–1.00)
GFR calc Af Amer: >60 mL/min (ref >60)
GFR calc non Af Amer: >60 mL/min (ref >60)
Glucose, Bld: 80 mg/dL (ref 70–99)
Potassium: 3.7 mmol/L (ref 3.5–5.1)
Sodium: 141 mmol/L (ref 135–145)

## 2019-10-13 MED ORDER — LISINOPRIL 10 MG PO TABS: 10.0000 mg | ORAL_TABLET | Freq: Every day | ORAL | 1 refills | Status: DC

## 2019-10-13 NOTE — ED Triage Notes (Signed)
Pt presents to the UC for medication refill and blood pressure check. Pt denies any symptoms at this moment.

## 2019-10-13 NOTE — ED Provider Notes (Signed)
[place]   [id number] 10/13/19 Arrival Time: [id number]  ASSESSMENT & PLAN:  1. Uncontrolled hypertension     To begin: Meds ordered this encounter  Medications  . lisinopril (ZESTRIL) 10 MG tablet    Sig: Take 1 tablet (10 mg total) by mouth daily.    Dispense:  30 tablet    Refill:  1   Pending: Labs Reviewed  BASIC METABOLIC PANEL   No s/s of hypertensive urgency.  Follow-up Information    Schedule an appointment as soon as possible for a visit  with Primary Care at [facility].   Specialty: Family Medicine Contact information: [address], [place] [phone]         May otherwise f/u here to recheck BP if unable to see PCP.  Reviewed expectations re: course of current medical issues. Questions answered. Outlined signs and symptoms indicating need for more acute intervention. Patient verbalized understanding. After Visit Summary given.   SUBJECTIVE:  Debbie Howard is a 58 y.o. female who presents with concerns regarding increased blood pressures. Reports elevated blood pressures over the past few days. She reports that she has been treated for hypertension in the past. Has not taken Lisinopril for about one year. No current PCP.  She reports no chest pain on exertion, no dyspnea on exertion, no swelling of ankles, no orthostatic dizziness or lightheadedness, no orthopnea or paroxysmal nocturnal dyspnea, no palpitations and no intermittent claudication symptoms.  Denies symptoms of chest pain, palpations, orthopnea, nocturnal dyspnea, or LE edema.  Social History   Tobacco Use  Smoking Status Never Smoker  Smokeless Tobacco Never Used     ROS: As per HPI.   OBJECTIVE:  Vitals:   10/13/19 1357  BP: (!) 151/84  Pulse: 64  Resp: 16  Temp: 98.6 F (37 C)  TempSrc: Oral  SpO2: 99%    General appearance: alert; no distress Eyes: PERRLA; EOMI HENT: normocephalic; atraumatic Neck:  supple Lungs: clear to auscultation bilaterally Heart: regular rate and rhythm without murmer Abdomen: soft, non-tender; bowel sounds normal Extremities: no edema; symmetrical with no gross deformities Skin: warm and dry Psychological: alert and cooperative; normal mood and affect   Labs Reviewed: Results for orders placed or performed during the hospital encounter of 11/07/18  Comprehensive metabolic panel  Result Value Ref Range   Sodium 141 135 - 145 mmol/L   Potassium 3.5 3.5 - 5.1 mmol/L   Chloride 107 98 - 111 mmol/L   CO2 26 22 - 32 mmol/L   Glucose, Bld 87 70 - 99 mg/dL   BUN 14 6 - 20 mg/dL   Creatinine, Ser 0.70 0.44 - 1.00 mg/dL   Calcium 9.3 8.9 - 10.3 mg/dL   Total Protein 8.0 6.5 - 8.1 g/dL   Albumin 4.4 3.5 - 5.0 g/dL   AST 15 15 - 41 U/L   ALT 21 0 - 44 U/L   Alkaline Phosphatase 71 38 - 126 U/L   Total Bilirubin 0.6 0.3 - 1.2 mg/dL   GFR calc non Af Amer >60 >60 mL/min   GFR calc Af Amer >60 >60 mL/min   Anion gap 8 5 - 15  CBC with Differential  Result Value Ref Range   WBC 3.8 (L) 4.0 - 10.5 K/uL   RBC 5.04 3.87 - 5.11 MIL/uL   Hemoglobin 14.5 12.0 - 15.0 g/dL   HCT 46.6 (H) 36.0 - 46.0 %   MCV 92.5 80.0 - 100.0 fL   MCH  28.8 26.0 - 34.0 pg   MCHC 31.1 30.0 - 36.0 g/dL   RDW 13.1 11.5 - 15.5 %   Platelets 259 150 - 400 K/uL   nRBC 0.0 0.0 - 0.2 %   Neutrophils Relative % 47 %   Neutro Abs 1.8 1.7 - 7.7 K/uL   Lymphocytes Relative 39 %   Lymphs Abs 1.5 0.7 - 4.0 K/uL   Monocytes Relative 10 %   Monocytes Absolute 0.4 0.1 - 1.0 K/uL   Eosinophils Relative 3 %   Eosinophils Absolute 0.1 0.0 - 0.5 K/uL   Basophils Relative 1 %   Basophils Absolute 0.0 0.0 - 0.1 K/uL   Immature Granulocytes 0 %   Abs Immature Granulocytes 0.01 0.00 - 0.07 K/uL  Urinalysis, Routine w reflex microscopic  Result Value Ref Range   Color, Urine COLORLESS (A) YELLOW   APPearance CLEAR CLEAR   Specific Gravity, Urine 1.003 (L) 1.005 - 1.030   pH 7.0 5.0 - 8.0    Glucose, UA NEGATIVE NEGATIVE mg/dL   Hgb urine dipstick NEGATIVE NEGATIVE   Bilirubin Urine NEGATIVE NEGATIVE   Ketones, ur NEGATIVE NEGATIVE mg/dL   Protein, ur NEGATIVE NEGATIVE mg/dL   Nitrite NEGATIVE NEGATIVE   Leukocytes, UA NEGATIVE NEGATIVE  Lipase, blood  Result Value Ref Range   Lipase 44 11 - 51 U/L   Labs Reviewed  BASIC METABOLIC PANEL     Allergies  Allergen Reactions  . Famotidine Swelling    REACTION: (per pt)  . Prednisone Hives and Swelling  . Iodine Hives and Rash    Past Medical History:  Diagnosis Date  . CVA (cerebral infarction) 2009   Right frontal lobe precentral gyrus infarct  . Hypertension   . 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   . Panic attacks   . SAB (spontaneous abortion)   . Stroke ([place])   . Transient ischemic attack 2009   x 3   Social History   Socioeconomic History  . Marital status: Single    Spouse name: Not on file  . Number of children: Not on file  . Years of education: Not on file  . Highest education level: Not on file  Occupational History  . Not on file  Tobacco Use  . Smoking status: Never Smoker  . Smokeless tobacco: Never Used  Substance and Sexual Activity  . Alcohol use: No  . Drug use: No  . Sexual activity: Not on file  Other Topics Concern  . Not on file  Social History Narrative   Pt works at a [employer] Strain:   . Difficulty of Paying Living Expenses: Not on file  Food Insecurity:   . Worried About Charity fundraiser in the Last Year: Not on file  . Ran Out of Food in the Last Year: Not on file  Transportation Needs:   . Lack of Transportation (Medical): Not on file  . Lack of Transportation (Non-Medical): Not on file  Physical Activity:   . Days of Exercise per Week: Not on file  . Minutes of Exercise per Session: Not on file  Stress:   . Feeling of Stress : Not on file  Social Connections:   .  Frequency of Communication with Friends and Family: Not on file  . Frequency of Social Gatherings with Friends and Family: Not on file  . Attends Religious Services: Not on file  . Active  Member of Clubs or Organizations: Not on file  . Attends Archivist Meetings: Not on file  . Marital Status: Not on file  Intimate Partner Violence:   . Fear of Current or Ex-Partner: Not on file  . Emotionally Abused: Not on file  . Physically Abused: Not on file  . Sexually Abused: Not on file   Family History  Problem Relation Age of Onset  . Stroke Mother   . Hypertension Mother   . Diabetes Brother   . Stroke Brother    Past Surgical History:  Procedure Laterality Date  . BREAST SURGERY        Vanessa Kick, MD 10/13/19 1436

## 2019-11-11 ENCOUNTER — Other Ambulatory Visit: Payer: Self-pay

## 2019-11-11 ENCOUNTER — Encounter (HOSPITAL_COMMUNITY): Payer: Self-pay

## 2019-11-11 ENCOUNTER — Ambulatory Visit (HOSPITAL_COMMUNITY)
Admission: EM | Admit: 2019-11-11 | Discharge: 2019-11-11 | Disposition: A | Discharge status: Home / Self Care | Payer: BLUE CROSS/BLUE SHIELD | Attending: Family Medicine | Admitting: Family Medicine

## 2019-11-11 DIAGNOSIS — K047 Periapical abscess without sinus: Secondary | ICD-10-CM

## 2019-11-11 MED ORDER — HYDROCODONE-ACETAMINOPHEN 7.5-325 MG PO TABS: 1.0000 | ORAL_TABLET | Freq: Four times a day (QID) | ORAL | 0 refills | Status: DC | PRN

## 2019-11-11 MED ORDER — CLINDAMYCIN HCL 300 MG PO CAPS: 300.0000 mg | ORAL_CAPSULE | Freq: Three times a day (TID) | ORAL | 0 refills | Status: DC

## 2019-11-11 NOTE — Discharge Instructions (Signed)
Take antibiotic 3 x a day Take pain medicine as needed Take medication with food Do not drive on the pain medicine

## 2019-11-11 NOTE — ED Triage Notes (Signed)
Pt. States she she has had dental pain since yesterday, thinks it is an abccess.

## 2019-11-11 NOTE — ED Provider Notes (Signed)
[place]    CSN: [id number] Arrival date & time: 11/11/19  [id number]      History   Chief Complaint Chief Complaint  Patient presents with  . Dental Pain    HPI Debbie Howard is a 59 y.o. female.   HPI   Woke up this morning with a painful swollen face Right upper tooth is very painful.  Hurts to chew and to open mouth Knows she is due for dental care No fever  Past Medical History:  Diagnosis Date  . CVA (cerebral infarction) 2009   Right frontal lobe precentral gyrus infarct  . Hypertension   . 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   . Panic attacks   . SAB (spontaneous abortion)   . Stroke ([place])   . Transient ischemic attack 2009   x 3    Patient Active Problem List   Diagnosis Date Noted  . MENINGIOMA 03/14/2008  . CEREBROVASCULAR ACCIDENT 03/14/2008  . ABORTION, SPONTANEOUS 03/14/2008  . HYPERTENSION NEC 03/14/2008    Past Surgical History:  Procedure Laterality Date  . BREAST SURGERY      OB History   No obstetric history on file.      Home Medications    Prior to Admission medications   Medication Sig Start Date End Date Taking? Authorizing Provider  aspirin EC 81 MG tablet Take 81 mg by mouth every other day.    [provider]  clindamycin (CLEOCIN) 300 MG capsule Take 1 capsule (300 mg total) by mouth 3 (three) times daily. 11/11/19   Raylene Everts, MD  HYDROcodone-acetaminophen ([place]) 7.5-325 MG tablet Take 1 tablet by mouth every 6 (six) hours as needed for moderate pain. 11/11/19   Raylene Everts, MD  Multiple Vitamin (DAILY VITAMINS PO) Take by mouth.    [provider]  FLUoxetine (PROZAC) 10 MG tablet Take 1 tablet (10 mg total) by mouth daily. 01/09/19 11/11/19  Robyn Haber, MD  lisinopril (ZESTRIL) 10 MG tablet Take 1 tablet (10 mg total) by mouth daily. 10/13/19 11/11/19  Vanessa Kick, MD    Family History Family History  Problem Relation Age of Onset  .  Stroke Mother   . Hypertension Mother   . Diabetes Brother   . Stroke Brother   . Heart failure Father     Social History Social History   Tobacco Use  . Smoking status: Never Smoker  . Smokeless tobacco: Never Used  Substance Use Topics  . Alcohol use: No  . Drug use: No     Allergies   Famotidine, Prednisone, and Iodine   Review of Systems Review of Systems  Constitutional: Positive for appetite change. Negative for chills and fever.  HENT: Positive for dental problem.   Neurological: Positive for facial asymmetry.     Physical Exam Triage Vital Signs ED Triage Vitals  Enc Vitals Group     BP 11/11/19 0855 (!) 144/82     Pulse Rate 11/11/19 0855 81     Resp 11/11/19 0855 17     Temp 11/11/19 0855 99 F (37.2 C)     Temp Source 11/11/19 0855 Oral     SpO2 11/11/19 0855 99 %     Weight 11/11/19 0854 168 lb 9.6 oz (76.5 kg)     Height --      Head Circumference --      Peak Flow --      Pain Score 11/11/19 0854 7  Pain Loc --      Pain Edu? --      Excl. in [place]? --    No data found.  Updated Vital Signs BP (!) 144/82 (BP Location: Right Arm)   Pulse 81   Temp 99 F (37.2 C) (Oral)   Resp 17   Wt 76.5 kg   SpO2 99%   BMI 26.41 kg/m   Visual Acuity Right Eye Distance:   Left Eye Distance:   Bilateral Distance:    Right Eye Near:   Left Eye Near:    Bilateral Near:     Physical Exam Constitutional:      General: She is not in acute distress.    Appearance: She is well-developed.  HENT:     Head: Normocephalic and atraumatic.      Mouth/Throat:   Eyes:     Conjunctiva/sclera: Conjunctivae normal.     Pupils: Pupils are equal, round, and reactive to light.  Cardiovascular:     Rate and Rhythm: Normal rate.  Pulmonary:     Effort: Pulmonary effort is normal. No respiratory distress.  Musculoskeletal:        General: Normal range of motion.     Cervical back: Normal range of motion.  Skin:    General: Skin is warm and dry.    Neurological:     Mental Status: She is alert.      UC Treatments / Results  Labs (all labs ordered are listed, but only abnormal results are displayed) Labs Reviewed - No data to display  EKG   Radiology No results found.  Procedures Procedures (including critical care time)  Medications Ordered in UC Medications - No data to display  Initial Impression / Assessment and Plan / UC Course  I have reviewed the triage vital signs and the nursing notes.  Pertinent labs & imaging results that were available during my care of the patient were reviewed by me and considered in my medical decision making (see chart for details).    Gave information on dental assistance Final Clinical Impressions(s) / UC Diagnoses   Final diagnoses:  Dental infection     Discharge Instructions     Take antibiotic 3 x a day Take pain medicine as needed Take medication with food Do not drive on the pain medicine   ED Prescriptions    Medication Sig Dispense Auth. Provider   HYDROcodone-acetaminophen ([place]) 7.5-325 MG tablet Take 1 tablet by mouth every 6 (six) hours as needed for moderate pain. 15 tablet Raylene Everts, MD   clindamycin (CLEOCIN) 300 MG capsule Take 1 capsule (300 mg total) by mouth 3 (three) times daily. 30 capsule Raylene Everts, MD     I have reviewed the PDMP during this encounter.   Raylene Everts, MD 11/11/19 1212

## 2019-12-28 ENCOUNTER — Telehealth: Payer: Self-pay

## 2019-12-28 ENCOUNTER — Other Ambulatory Visit: Payer: Self-pay

## 2020-01-12 ENCOUNTER — Ambulatory Visit (HOSPITAL_COMMUNITY)
Admission: EM | Admit: 2020-01-12 | Discharge: 2020-01-12 | Disposition: A | Discharge status: Home / Self Care | Payer: 59

## 2020-01-12 ENCOUNTER — Other Ambulatory Visit: Payer: Self-pay

## 2020-01-12 ENCOUNTER — Encounter (HOSPITAL_COMMUNITY): Payer: Self-pay

## 2020-01-12 DIAGNOSIS — Z8673 Personal history of transient ischemic attack (TIA), and cerebral infarction without residual deficits: Secondary | ICD-10-CM

## 2020-01-12 DIAGNOSIS — Z566 Other physical and mental strain related to work: Secondary | ICD-10-CM

## 2020-01-12 DIAGNOSIS — I10 Essential (primary) hypertension: Secondary | ICD-10-CM

## 2020-01-12 NOTE — ED Triage Notes (Signed)
Pt is here stating that yesterday she got upset with a coworker & felt her BP rise. States she just was needing a note to get a few days off to rest. Pt is denying all HBP symptoms today.

## 2020-01-12 NOTE — Discharge Instructions (Addendum)
Work note given to rest  Follow up as needed for continued or worsening symptoms

## 2020-01-13 NOTE — ED Provider Notes (Signed)
[place]    CSN: [id number] Arrival date & time: 01/12/20  1425      History   Chief Complaint Chief Complaint  Patient presents with  . Hypertension    HPI TIYAH BABKA is a 59 y.o. female.   Pt is a 59 year old female that presents with stress at work. Hx of CVA, HTN, panic attacks, TIA.  Patient has multiple reasons she is upset at work and is here to get a work note for a few days off.  Patient very hostile when walking in exam room to talk to patient.  Patient immediately asked me to sit down because she had many things to talk about.  She went home about mold on the walls at work, stress with employees at work and concern for [employer] at work with people not wearing mask. She asked for pamphlets on mold and covid.   Blood pressure mildly elevated today.  No concerning signs or symptoms today.      Past Medical History:  Diagnosis Date  . CVA (cerebral infarction) 2009   Right frontal lobe precentral gyrus infarct  . Hypertension   . 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   . Panic attacks   . SAB (spontaneous abortion)   . Stroke ([place])   . Transient ischemic attack 2009   x 3    Patient Active Problem List   Diagnosis Date Noted  . MENINGIOMA 03/14/2008  . CEREBROVASCULAR ACCIDENT 03/14/2008  . ABORTION, SPONTANEOUS 03/14/2008  . HYPERTENSION NEC 03/14/2008    Past Surgical History:  Procedure Laterality Date  . BREAST SURGERY      OB History   No obstetric history on file.      Home Medications    Prior to Admission medications   Medication Sig Start Date End Date Taking? Authorizing Provider  aspirin EC 81 MG tablet Take 81 mg by mouth every other day.    [provider]  lisinopril (ZESTRIL) 10 MG tablet Take 10 mg by mouth daily. 11/14/19   [provider]  Multiple Vitamin (DAILY VITAMINS PO) Take by mouth.    [provider]  FLUoxetine (PROZAC) 10 MG tablet Take 1 tablet  (10 mg total) by mouth daily. 01/09/19 11/11/19  Robyn Haber, MD    Family History Family History  Problem Relation Age of Onset  . Stroke Mother   . Hypertension Mother   . Diabetes Brother   . Stroke Brother   . Heart failure Father     Social History Social History   Tobacco Use  . Smoking status: Never Smoker  . Smokeless tobacco: Never Used  Substance Use Topics  . Alcohol use: No  . Drug use: No     Allergies   Famotidine, Prednisone, and Iodine   Review of Systems Review of Systems   Physical Exam Triage Vital Signs ED Triage Vitals  Enc Vitals Group     BP 01/12/20 1444 (!) 143/97     Pulse Rate 01/12/20 1444 81     Resp 01/12/20 1444 17     Temp 01/12/20 1444 98.5 F (36.9 C)     Temp Source 01/12/20 1444 Oral     SpO2 01/12/20 1444 95 %     Weight 01/12/20 1443 161 lb 12.8 oz (73.4 kg)     Height --      Head Circumference --      Peak Flow --  Pain Score 01/12/20 1443 0     Pain Loc --      Pain Edu? --      Excl. in [place]? --    No data found.  Updated Vital Signs BP (!) 143/97 (BP Location: Left Arm)   Pulse 81   Temp 98.5 F (36.9 C) (Oral)   Resp 17   Wt 161 lb 12.8 oz (73.4 kg)   SpO2 95%   BMI 25.34 kg/m   Visual Acuity Right Eye Distance:   Left Eye Distance:   Bilateral Distance:    Right Eye Near:   Left Eye Near:    Bilateral Near:     Physical Exam Vitals and nursing note reviewed.  Constitutional:      General: She is not in acute distress.    Appearance: Normal appearance. She is not ill-appearing, toxic-appearing or diaphoretic.  HENT:     Head: Normocephalic.     Nose: Nose normal.  Eyes:     Conjunctiva/sclera: Conjunctivae normal.  Pulmonary:     Effort: Pulmonary effort is normal.  Musculoskeletal:        General: Normal range of motion.     Cervical back: Normal range of motion.  Skin:    General: Skin is warm and dry.     Findings: No rash.  Neurological:     Mental Status: She is alert.   Psychiatric:        Mood and Affect: Mood is anxious. Affect is blunt and angry.        Speech: Speech is rapid and pressured.        Behavior: Behavior is aggressive.      UC Treatments / Results  Labs (all labs ordered are listed, but only abnormal results are displayed) Labs Reviewed - No data to display  EKG   Radiology No results found.  Procedures Procedures (including critical care time)  Medications Ordered in UC Medications - No data to display  Initial Impression / Assessment and Plan / UC Course  I have reviewed the triage vital signs and the nursing notes.  Pertinent labs & imaging results that were available during my care of the patient were reviewed by me and considered in my medical decision making (see chart for details).     Stress at work-patient here for work note for few days off due to stressful environment at work Patient was very angry, hostile and aggressive in exam room. Gave her pamphlets as requested and work note as requested Recommended try to reduce stress level due to elevated blood pressure and history of CVA Final Clinical Impressions(s) / UC Diagnoses   Final diagnoses:  Stress at work     Discharge Instructions     Work note given to rest  Follow up as needed for continued or worsening symptoms     ED Prescriptions    None     PDMP not reviewed this encounter.   Orvan July, NP 01/13/20 1156

## 2020-01-24 ENCOUNTER — Encounter (HOSPITAL_COMMUNITY): Payer: Self-pay

## 2020-01-24 ENCOUNTER — Other Ambulatory Visit: Payer: Self-pay

## 2020-01-24 ENCOUNTER — Ambulatory Visit (HOSPITAL_COMMUNITY)
Admission: EM | Admit: 2020-01-24 | Discharge: 2020-01-24 | Disposition: A | Discharge status: Home / Self Care | Payer: 59 | Attending: Family Medicine | Admitting: Family Medicine

## 2020-01-24 DIAGNOSIS — K029 Dental caries, unspecified: Secondary | ICD-10-CM

## 2020-01-24 DIAGNOSIS — I10 Essential (primary) hypertension: Secondary | ICD-10-CM

## 2020-01-24 DIAGNOSIS — K047 Periapical abscess without sinus: Secondary | ICD-10-CM

## 2020-01-24 MED ORDER — CLINDAMYCIN HCL 150 MG PO CAPS: 150.0000 mg | ORAL_CAPSULE | Freq: Four times a day (QID) | ORAL | 0 refills | Status: DC

## 2020-01-24 MED ORDER — LISINOPRIL 10 MG PO TABS: 10.0000 mg | ORAL_TABLET | Freq: Every day | ORAL | 1 refills | Status: DC
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
## 2020-01-24 NOTE — ED Provider Notes (Signed)
[place]    CSN: [id number] Arrival date & time: 01/24/20  1559      History   Chief Complaint Chief Complaint  Patient presents with  . Dental Pain    HPI Debbie Howard is a 59 y.o. female.   Patient presents with dental pain today.  She reports that she has 2 teeth on the top of her mouth that she needs to have pulled.  One tooth on each side, reports that usually only molars that she has left in the back on the top.  Denies having dental insurance or an established dentist.  Reports that she knows that she needs to have these teeth pulled and that this has been occurring on and off for the last month and a half.  Also requesting medication refill of lisinopril.  Reports that someone took hers off of her desk at work.  Denies headache, cough, shortness of breath, chest tightness, nausea, vomiting, diarrhea, rash, fever, other symptoms.  Per chart review, patient has history of CVA, meningioma, hypertension.  ROS per HPI  The history is provided by the patient.    Past Medical History:  Diagnosis Date  . CVA (cerebral infarction) 2009   Right frontal lobe precentral gyrus infarct  . Hypertension   . Meningioma ([place]) 2009    Probable right anterior frontal meningioma.   . Panic attacks   . SAB (spontaneous abortion)   . Stroke ([place])   . Transient ischemic attack 2009   x 3    Patient Active Problem List   Diagnosis Date Noted  . MENINGIOMA 03/14/2008  . CEREBROVASCULAR ACCIDENT 03/14/2008  . ABORTION, SPONTANEOUS 03/14/2008  . HYPERTENSION NEC 03/14/2008    Past Surgical History:  Procedure Laterality Date  . BREAST SURGERY      OB History   No obstetric history on file.      Home Medications    Prior to Admission medications   Medication Sig Start Date End Date Taking? Authorizing Provider  aspirin EC 81 MG tablet Take 81 mg by mouth every other day.   Yes [provider]  Multiple Vitamin (DAILY VITAMINS PO) Take by mouth.    Yes [provider]  clindamycin (CLEOCIN) 150 MG capsule Take 1 capsule (150 mg total) by mouth every 6 (six) hours. 01/24/20   Faustino Congress, NP  lisinopril (ZESTRIL) 10 MG tablet Take 1 tablet (10 mg total) by mouth daily. 01/24/20   Faustino Congress, NP  FLUoxetine (PROZAC) 10 MG tablet Take 1 tablet (10 mg total) by mouth daily. 01/09/19 11/11/19  Robyn Haber, MD    Family History Family History  Problem Relation Age of Onset  . Stroke Mother   . Hypertension Mother   . Diabetes Brother   . Stroke Brother   . Heart failure Father     Social History Social History   Tobacco Use  . Smoking status: Never Smoker  . Smokeless tobacco: Never Used  Substance Use Topics  . Alcohol use: No  . Drug use: No     Allergies   Famotidine, Prednisone, and Iodine   Review of Systems Review of Systems   Physical Exam Triage Vital Signs ED Triage Vitals [01/24/20 1615]  Enc Vitals Group     BP (!) 179/95     Pulse Rate 73     Resp 20     Temp 98.1 F (36.7 C)     Temp Source Oral     SpO2 100 %  Weight      Height      Head Circumference      Peak Flow      Pain Score 4     Pain Loc      Pain Edu?      Excl. in [place]?    No data found.  Updated Vital Signs BP (!) 179/95 (BP Location: Left Arm)   Pulse 73   Temp 98.1 F (36.7 C) (Oral)   Resp 20   SpO2 100%       Physical Exam Vitals and nursing note reviewed.  Constitutional:      General: She is not in acute distress.    Appearance: Normal appearance. She is well-developed and normal weight. She is not ill-appearing.  HENT:     Head: Normocephalic and atraumatic.     Right Ear: Tympanic membrane normal.     Left Ear: Tympanic membrane normal.     Nose: Nose normal.     Mouth/Throat:     Mouth: Mucous membranes are moist.     Dentition: Abnormal dentition. Dental tenderness, gingival swelling, dental caries and dental abscesses present.     Pharynx: Oropharynx is clear.       Comments: Areas of dental abscesses. Eyes:     Conjunctiva/sclera: Conjunctivae normal.  Cardiovascular:     Rate and Rhythm: Normal rate and regular rhythm.     Heart sounds: Normal heart sounds. No murmur.  Pulmonary:     Effort: Pulmonary effort is normal. No respiratory distress.     Breath sounds: Normal breath sounds.  Abdominal:     General: Bowel sounds are normal. There is no distension.     Palpations: Abdomen is soft. There is no mass.     Tenderness: There is no abdominal tenderness. There is no guarding or rebound.     Hernia: No hernia is present.  Musculoskeletal:        General: Normal range of motion.     Cervical back: Normal range of motion and neck supple.  Skin:    General: Skin is warm and dry.     Capillary Refill: Capillary refill takes less than 2 seconds.  Neurological:     General: No focal deficit present.     Mental Status: She is alert and oriented to person, place, and time.  Psychiatric:        Mood and Affect: Mood normal.        Behavior: Behavior normal.        Thought Content: Thought content normal.      UC Treatments / Results  Labs (all labs ordered are listed, but only abnormal results are displayed) Labs Reviewed - No data to display  EKG   Radiology No results found.  Procedures Procedures (including critical care time)  Medications Ordered in UC Medications - No data to display  Initial Impression / Assessment and Plan / UC Course  I have reviewed the triage vital signs and the nursing notes.  Pertinent labs & imaging results that were available during my care of the patient were reviewed by me and considered in my medical decision making (see chart for details).     Presents today with dental abscess on and off for the last month and a half.  Prescribed clindamycin 150 mg by mouth every 6 hours.  Instructed that she needs to get established with a dentist and dental care to have these teeth and infection taken care  of.  Medication refill  of lisinopril filled today.  Discussed with patient that she needs to have primary care to continue these refills, that she possibly also needs blood work.  Instructed patient to report to the ER for trouble swallowing, trouble breathing, other concerning symptoms. Final Clinical Impressions(s) / UC Diagnoses   Final diagnoses:  Dental caries  Dental abscess     Discharge Instructions     Take antibiotic as prescribed and to completion  Return sooner or go to the ED if you have any new or worsening symptoms such as increased redness, swelling, pain, nausea, vomiting, fever, chills, etc...     ED Prescriptions    Medication Sig Dispense Auth. Provider   lisinopril (ZESTRIL) 10 MG tablet Take 1 tablet (10 mg total) by mouth daily. 30 tablet Faustino Congress, NP   clindamycin (CLEOCIN) 150 MG capsule Take 1 capsule (150 mg total) by mouth every 6 (six) hours. 28 capsule Faustino Congress, NP     I have reviewed the PDMP during this encounter.   Faustino Congress, NP 01/26/20 [phone]

## 2020-01-24 NOTE — Discharge Instructions (Signed)
Take antibiotic as prescribed and to completion  Return sooner or go to the ED if you have any new or worsening symptoms such as increased redness, swelling, pain, nausea, vomiting, fever, chills, etc..Marland Kitchen

## 2020-02-03 ENCOUNTER — Emergency Department (HOSPITAL_COMMUNITY)
Admission: EM | Admit: 2020-02-03 | Discharge: 2020-02-03 | Disposition: A | Discharge status: Home / Self Care | Payer: Worker's Compensation | Attending: Emergency Medicine | Admitting: Emergency Medicine

## 2020-02-03 ENCOUNTER — Encounter (HOSPITAL_COMMUNITY): Payer: Self-pay | Admitting: *Deleted

## 2020-02-03 ENCOUNTER — Emergency Department (HOSPITAL_COMMUNITY): Payer: Worker's Compensation

## 2020-02-03 ENCOUNTER — Other Ambulatory Visit: Payer: Self-pay

## 2020-02-03 DIAGNOSIS — W208XXA Other cause of strike by thrown, projected or falling object, initial encounter: Secondary | ICD-10-CM | POA: Insufficient documentation

## 2020-02-03 DIAGNOSIS — I10 Essential (primary) hypertension: Secondary | ICD-10-CM | POA: Diagnosis not present

## 2020-02-03 DIAGNOSIS — S060X0A Concussion without loss of consciousness, initial encounter: Secondary | ICD-10-CM | POA: Diagnosis not present

## 2020-02-03 DIAGNOSIS — S0990XA Unspecified injury of head, initial encounter: Secondary | ICD-10-CM | POA: Diagnosis present

## 2020-02-03 DIAGNOSIS — Z7982 Long term (current) use of aspirin: Secondary | ICD-10-CM | POA: Insufficient documentation

## 2020-02-03 DIAGNOSIS — Z8673 Personal history of transient ischemic attack (TIA), and cerebral infarction without residual deficits: Secondary | ICD-10-CM | POA: Insufficient documentation

## 2020-02-03 DIAGNOSIS — Y9389 Activity, other specified: Secondary | ICD-10-CM | POA: Diagnosis not present

## 2020-02-03 DIAGNOSIS — Y929 Unspecified place or not applicable: Secondary | ICD-10-CM | POA: Diagnosis not present

## 2020-02-03 DIAGNOSIS — Z79899 Other long term (current) drug therapy: Secondary | ICD-10-CM | POA: Insufficient documentation

## 2020-02-03 DIAGNOSIS — Y990 Civilian activity done for income or pay: Secondary | ICD-10-CM | POA: Insufficient documentation

## 2020-02-03 NOTE — ED Provider Notes (Signed)
[facility] Provider Note   CSN: [id number] Arrival date & time: 02/03/20  1618     History Chief Complaint  Patient presents with  . Head Injury    HPI   Blood pressure (!) 148/76, pulse 67, temperature 97.8 F (36.6 C), temperature source Temporal, resp. rate 20, height 5\' 6"  (1.676 m), weight 70.3 kg, SpO2 100 %.  Debbie Howard is a 59 y.o. female complaining of having severe frontal headache after going to list a plant holder off of a shelf above her head and then the plant fell on her.  There was no loss of consciousness, nausea vomiting.  She does have some pain at the site no bleeding.  No associated neck pain no change in vision no dysarthria, ataxia.  There was no other trauma today.  She does note that she feels very upset that this is happening to her.  She is not anticoagulated but she takes an 81 mg aspirin every other day.     Past Medical History:  Diagnosis Date  . CVA (cerebral infarction) 2009   Right frontal lobe precentral gyrus infarct  . Hypertension   . Meningioma ([place]) 2009    Probable right anterior frontal meningioma.   . Panic attacks   . SAB (spontaneous abortion)   . Stroke ([place])   . Transient ischemic attack 2009   x 3    Patient Active Problem List   Diagnosis Date Noted  . MENINGIOMA 03/14/2008  . CEREBROVASCULAR ACCIDENT 03/14/2008  . ABORTION, SPONTANEOUS 03/14/2008  . HYPERTENSION NEC 03/14/2008    Past Surgical History:  Procedure Laterality Date  . BREAST SURGERY       OB History   No obstetric history on file.     Family History  Problem Relation Age of Onset  . Stroke Mother   . Hypertension Mother   . Diabetes Brother   . Stroke Brother   . Heart failure Father     Social History   Tobacco Use  . Smoking status: Never Smoker  . Smokeless tobacco: Never Used  Substance Use Topics  . Alcohol use: No  . Drug use: No    Home Medications Prior to Admission medications     Medication Sig Start Date End Date Taking? Authorizing Provider  aspirin EC 81 MG tablet Take 81 mg by mouth every other day.    [provider]  clindamycin (CLEOCIN) 150 MG capsule Take 1 capsule (150 mg total) by mouth every 6 (six) hours. 01/24/20   Faustino Congress, NP  lisinopril (ZESTRIL) 10 MG tablet Take 1 tablet (10 mg total) by mouth daily. 01/24/20   Faustino Congress, NP  Multiple Vitamin (DAILY VITAMINS PO) Take by mouth.    [provider]  FLUoxetine (PROZAC) 10 MG tablet Take 1 tablet (10 mg total) by mouth daily. 01/09/19 11/11/19  Robyn Haber, MD    Allergies    Famotidine, Prednisone, and Iodine  Review of Systems   Review of Systems   A complete review of systems was obtained and all systems are negative except as noted in the HPI and PMH.   Physical Exam Updated Vital Signs BP (!) 148/76 (BP Location: Right Arm)   Pulse 67   Temp 97.8 F (36.6 C) (Temporal)   Resp 20   Ht 5\' 6"  (1.676 m)   Wt 70.3 kg   SpO2 100%   BMI 25.02 kg/m   Physical Exam Vitals and nursing note reviewed.  Constitutional:      Appearance: She is well-developed.  HENT:     Head: Normocephalic and atraumatic.  Eyes:     Conjunctiva/sclera: Conjunctivae normal.     Pupils: Pupils are equal, round, and reactive to light.  Neck:     Comments: No midline C-spine  tenderness to palpation or step-offs appreciated. Patient has full range of motion without pain.  Grip/bicep/tricep strength 5/5 bilaterally. Able to differentiate between pinprick and light touch bilaterally    Cardiovascular:     Rate and Rhythm: Normal rate and regular rhythm.  Pulmonary:     Effort: Pulmonary effort is normal. No respiratory distress.     Breath sounds: Normal breath sounds. No wheezing or rales.  Chest:     Chest wall: No tenderness.  Abdominal:     General: Bowel sounds are normal. There is no distension.     Palpations: Abdomen is soft. There is no mass.      Tenderness: There is no abdominal tenderness. There is no guarding or rebound.  Musculoskeletal:        General: No tenderness. Normal range of motion.     Cervical back: Normal range of motion and neck supple.     Comments: Pelvis stable, No TTP of greater trochanter bilaterally  No tenderness to percussion of Lumbar/Thoracic spinous processes. No step-offs. No paraspinal muscular TTP  Skin:    General: Skin is warm.  Neurological:     Mental Status: She is alert and oriented to person, place, and time.     Comments: Strength 5/5 x4 extremities   Distal sensation intact     ED Results / Procedures / Treatments   Labs (all labs ordered are listed, but only abnormal results are displayed) Labs Reviewed - No data to display  EKG None  Radiology CT Head Wo Contrast  Result Date: 02/03/2020 CLINICAL DATA:  Facial trauma.  Fall. EXAM: CT HEAD WITHOUT CONTRAST TECHNIQUE: Contiguous axial images were obtained from the base of the skull through the vertex without intravenous contrast. COMPARISON:  11/07/2018 FINDINGS: Brain: Old right posterior parietal infarct, stable. No acute intracranial abnormality. Specifically, no hemorrhage, hydrocephalus, mass lesion, acute infarction, or significant intracranial injury. Vascular: No hyperdense vessel or unexpected calcification. Skull: No acute calvarial abnormality. Sinuses/Orbits: Visualized paranasal sinuses and mastoids clear. Orbital soft tissues unremarkable. Other: None IMPRESSION: No acute intracranial abnormality. Electronically Signed   By: Rolm Baptise M.D.   On: 02/03/2020 18:26    Procedures Procedures (including critical care time)  Medications Ordered in ED Medications - No data to display  ED Course  I have reviewed the triage vital signs and the nursing notes.  Pertinent labs & imaging results that were available during my care of the patient were reviewed by me and considered in my medical decision making (see chart for  details).    MDM Rules/Calculators/A&P                      Vitals:   02/03/20 1631 02/03/20 1718  BP: 136/80 (!) 148/76  Pulse: 71 67  Resp: 16 20  Temp: 98.3 F (36.8 C) 97.8 F (36.6 C)  TempSrc: Oral Temporal  SpO2: 98% 100%  Weight: 70.3 kg   Height: 5\' 6"  (1.676 m)      Debbie Howard is 59 y.o. female presenting with frontal head trauma, strong neurologic exam.  Patient extremely worried, noncontrast CT pending.  Patient declines pain medication states that acetaminophen caused former coworker  to get cancer.  Noncontrasted head CT normal, discussed concussion precautions  Evaluation does not show pathology that would require ongoing emergent intervention or inpatient treatment. Pt is hemodynamically stable and mentating appropriately. Discussed findings and plan with patient/guardian, who agrees with care plan. All questions answered. Return precautions discussed and outpatient follow up given.     Final Clinical Impression(s) / ED Diagnoses Final diagnoses:  Concussion without loss of consciousness, initial encounter    Rx / DC Orders ED Discharge Orders    None       Mesiah Manzo, Charna Elizabeth 02/03/20 1831    Drenda Freeze, MD 02/03/20 2322

## 2020-02-03 NOTE — Discharge Instructions (Signed)
Do not participate in any sports or any activities that could result in head trauma until you are cleared by your pediatrician,  primary care physician or neurologist.   Please follow with your primary care doctor in the next 2 days for a check-up. They must obtain records for further management.   Do not hesitate to return to the Emergency Department for any new, worsening or concerning symptoms.

## 2020-02-03 NOTE — ED Notes (Signed)
Pt. Transported to ct.

## 2020-02-03 NOTE — ED Triage Notes (Signed)
To ED for eval after having a plant stand fall from a shelf and hit her in the head. No nausea or vomiting. Speech clear. Appears in nad. No obvious injury. Pupils equal and react to light.

## 2020-02-07 ENCOUNTER — Other Ambulatory Visit: Payer: Self-pay

## 2020-02-07 ENCOUNTER — Emergency Department (HOSPITAL_COMMUNITY)
Admission: EM | Admit: 2020-02-07 | Discharge: 2020-02-07 | Disposition: A | Discharge status: Home / Self Care | Payer: 59 | Attending: Emergency Medicine | Admitting: Emergency Medicine

## 2020-02-07 ENCOUNTER — Emergency Department (HOSPITAL_COMMUNITY): Payer: 59

## 2020-02-07 ENCOUNTER — Encounter (HOSPITAL_COMMUNITY): Payer: Self-pay | Admitting: *Deleted

## 2020-02-07 DIAGNOSIS — F0781 Postconcussional syndrome: Secondary | ICD-10-CM | POA: Diagnosis not present

## 2020-02-07 DIAGNOSIS — R519 Headache, unspecified: Secondary | ICD-10-CM | POA: Insufficient documentation

## 2020-02-07 DIAGNOSIS — M542 Cervicalgia: Secondary | ICD-10-CM | POA: Insufficient documentation

## 2020-02-07 DIAGNOSIS — Z7982 Long term (current) use of aspirin: Secondary | ICD-10-CM | POA: Diagnosis not present

## 2020-02-07 DIAGNOSIS — W208XXD Other cause of strike by thrown, projected or falling object, subsequent encounter: Secondary | ICD-10-CM | POA: Diagnosis not present

## 2020-02-07 DIAGNOSIS — I10 Essential (primary) hypertension: Secondary | ICD-10-CM | POA: Insufficient documentation

## 2020-02-07 LAB — CBC
HCT: 44.3 % (ref 36.0–46.0)
Hemoglobin: 14.3 g/dL (ref 12.0–15.0)
MCH: 28.8 pg (ref 26.0–34.0)
MCHC: 32.3 g/dL (ref 30.0–36.0)
MCV: 89.1 fL (ref 80.0–100.0)
Platelets: 256 10*3/uL (ref 150–400)
RBC: 4.97 MIL/uL (ref 3.87–5.11)
RDW: 12.3 % (ref 11.5–15.5)
WBC: 4.3 10*3/uL (ref 4.0–10.5)
nRBC: 0 % (ref 0.0–0.2)

## 2020-02-07 LAB — COMPREHENSIVE METABOLIC PANEL
ALT: 17 U/L (ref 0–44)
AST: 14 U/L — ABNORMAL LOW (ref 15–41)
Albumin: 3.8 g/dL (ref 3.5–5.0)
Alkaline Phosphatase: 53 U/L (ref 38–126)
Anion gap: 6 (ref 5–15)
BUN: 15 mg/dL (ref 8–23)
CO2: 29 mmol/L (ref 22–32)
Calcium: 9.2 mg/dL (ref 8.9–10.3)
Chloride: 107 mmol/L (ref 98–111)
Creatinine, Ser: 0.61 mg/dL (ref 0.44–1.00)
GFR calc Af Amer: >60 mL/min (ref >60)
GFR calc non Af Amer: >60 mL/min (ref >60)
Glucose, Bld: 77 mg/dL (ref 70–99)
Potassium: 3.8 mmol/L (ref 3.5–5.1)
Sodium: 142 mmol/L (ref 135–145)
Total Bilirubin: 0.4 mg/dL (ref 0.3–1.2)
Total Protein: 7.8 g/dL (ref 6.5–8.1)

## 2020-02-07 MED ORDER — ONDANSETRON 4 MG PO TBDP: 4.0000 mg | ORAL_TABLET | Freq: Three times a day (TID) | ORAL | 0 refills | Status: DC | PRN

## 2020-02-07 NOTE — ED Notes (Signed)
Pt transported to CT ?

## 2020-02-07 NOTE — ED Notes (Signed)
Patient verbalizes understanding of discharge instructions. Opportunity for questioning and answers were provided. Armband removed by staff, pt discharged from ED ambulatory to home.  

## 2020-02-07 NOTE — ED Provider Notes (Signed)
[facility] EM[facility]GENCY DEPARTMENT Provider Note   CSN: [id number] Arrival date & time: 02/07/20  1201     History Chief Complaint  Patient presents with  . Headache    Debbie Howard is a 59 y.o. female.  Presented to ER with chief complaint headache, neck pain.  Patient reports 4/a she hit in the head while at work, came to ER with headache.  CT scan of her head was negative and discharged home.  Since that time she has been having some foggy headedness, feeling somewhat sleepy.  No changes in headache.  And also feels like her neck has been more stiff lately, having some dull neck pain.  Has not followed up with primary doctor.  HPI     Past Medical History:  Diagnosis Date  . CVA (cerebral infarction) 2009   Right frontal lobe precentral gyrus infarct  . Hypertension   . Meningioma ([place]) 2009    Probable right anterior frontal meningioma.   . Panic attacks   . SAB (spontaneous abortion)   . Stroke ([place])   . Transient ischemic attack 2009   x 3    Patient Active Problem List   Diagnosis Date Noted  . MENINGIOMA 03/14/2008  . CEREBROVASCULAR ACCIDENT 03/14/2008  . ABORTION, SPONTANEOUS 03/14/2008  . HYPERTENSION NEC 03/14/2008    Past Surgical History:  Procedure Laterality Date  . BREAST SURGERY       OB History   No obstetric history on file.     Family History  Problem Relation Age of Onset  . Stroke Mother   . Hypertension Mother   . Diabetes Brother   . Stroke Brother   . Heart failure Father     Social History   Tobacco Use  . Smoking status: Never Smoker  . Smokeless tobacco: Never Used  Substance Use Topics  . Alcohol use: No  . Drug use: No    Home Medications Prior to Admission medications   Medication Sig Start Date End Date Taking? Authorizing Provider  aspirin EC 81 MG tablet Take 81 mg by mouth every other day.    [provider]  clindamycin (CLEOCIN) 150 MG capsule Take 1 capsule (150 mg total) by  mouth every 6 (six) hours. 01/24/20   Faustino Congress, NP  lisinopril (ZESTRIL) 10 MG tablet Take 1 tablet (10 mg total) by mouth daily. 01/24/20   Faustino Congress, NP  Multiple Vitamin (DAILY VITAMINS PO) Take by mouth.    [provider]  ondansetron (ZOFRAN ODT) 4 MG disintegrating tablet Take 1 tablet (4 mg total) by mouth every 8 (eight) hours as needed for nausea or vomiting. 02/07/20   Lucrezia Starch, MD  FLUoxetine (PROZAC) 10 MG tablet Take 1 tablet (10 mg total) by mouth daily. 01/09/19 11/11/19  Robyn Haber, MD    Allergies    Famotidine, Prednisone, and Iodine  Review of Systems   Review of Systems  Constitutional: Negative for chills and fever.  HENT: Negative for ear pain and sore throat.   Eyes: Negative for pain and visual disturbance.  Respiratory: Negative for cough and shortness of breath.   Cardiovascular: Negative for chest pain and palpitations.  Gastrointestinal: Negative for abdominal pain and vomiting.  Genitourinary: Negative for dysuria and hematuria.  Musculoskeletal: Positive for neck pain. Negative for arthralgias and back pain.  Skin: Negative for color change and rash.  Neurological: Positive for headaches. Negative for seizures and syncope.  All other systems reviewed and are negative.  Physical Exam Updated Vital Signs BP 126/87   Pulse 80   Temp 98.8 F (37.1 C) (Oral)   Resp 20   Ht 5\' 7"  (1.702 m)   Wt 74.8 kg   SpO2 95%   BMI 25.84 kg/m   Physical Exam Vitals and nursing note reviewed.  Constitutional:      General: She is not in acute distress.    Appearance: She is well-developed.  HENT:     Head: Normocephalic and atraumatic.  Eyes:     Conjunctiva/sclera: Conjunctivae normal.  Cardiovascular:     Rate and Rhythm: Normal rate and regular rhythm.     Heart sounds: No murmur.  Pulmonary:     Effort: Pulmonary effort is normal. No respiratory distress.     Breath sounds: Normal breath sounds.  Abdominal:       Palpations: Abdomen is soft.     Tenderness: There is no abdominal tenderness.  Musculoskeletal:     Cervical back: Neck supple.  Skin:    General: Skin is warm and dry.  Neurological:     Mental Status: She is alert.     GCS: GCS eye subscore is 4. GCS verbal subscore is 5. GCS motor subscore is 6.     Cranial Nerves: No cranial nerve deficit or dysarthria.     Sensory: No sensory deficit.     Motor: No weakness.     Gait: Gait normal.  Psychiatric:        Mood and Affect: Mood normal.        Behavior: Behavior normal.     ED Results / Procedures / Treatments   Labs (all labs ordered are listed, but only abnormal results are displayed) Labs Reviewed  COMPREHENSIVE METABOLIC PANEL - Abnormal; Notable for the following components:      Result Value   AST 14 (*)    All other components within normal limits  CBC    EKG None  Radiology CT Cervical Spine Wo Contrast  Result Date: 02/07/2020 CLINICAL DATA:  Neck pain. Hit with a flower vase on Thursday. EXAM: CT CERVICAL SPINE WITHOUT CONTRAST TECHNIQUE: Multidetector CT imaging of the cervical spine was performed without intravenous contrast. Multiplanar CT image reconstructions were also generated. COMPARISON:  CT cervical spine dated November 07, 2018. FINDINGS: Alignment: No traumatic malalignment. Progressive reversal of the normal cervical lordosis. Skull base and vertebrae: No acute fracture. No primary bone lesion or focal pathologic process. Soft tissues and spinal canal: No prevertebral fluid or swelling. No visible canal hematoma. Disc levels: Unchanged moderate disc height loss at C6-C7. Unchanged scattered bilateral uncovertebral hypertrophy, moderate on the left at C6-C7. Upper chest: Negative. Other: None. IMPRESSION: No acute cervical spine fracture. Electronically Signed   By: Titus Dubin M.D.   On: 02/07/2020 18:13    Procedures Procedures (including critical care time)  Medications Ordered in  ED Medications - No data to display  ED Course  I have reviewed the triage vital signs and the nursing notes.  Pertinent labs & imaging results that were available during my care of the patient were reviewed by me and considered in my medical decision making (see chart for details).    MDM Rules/Calculators/A&P                      59 year old lady presenting to ER with ongoing headaches, foggy headedness, neck pain after head trauma few days ago.  CT head was negative on last ER visit.  Obtain  CT C-spine today given her reported neck pain, but this was negative.  Suspect patient is suffering from concussion.  Recommended follow-up with primary doctor, Tylenol and Zofran as needed.  Recommended rest from strenuous physical and mental exercises until cleared by primary doctor.    After the discussed management above, the patient was determined to be safe for discharge.  The patient was in agreement with this plan and all questions regarding their care were answered.  ED return precautions were discussed and the patient will return to the ED with any significant worsening of condition.   Final Clinical Impression(s) / ED Diagnoses Final diagnoses:  Post concussion syndrome    Rx / DC Orders ED Discharge Orders         Ordered    ondansetron (ZOFRAN ODT) 4 MG disintegrating tablet  Every 8 hours PRN     02/07/20 1943           Lucrezia Starch, MD 02/09/20 0000

## 2020-02-07 NOTE — Discharge Instructions (Signed)
Recommend following up with primary doctor regarding symptoms you are experiencing today.  Additionally can follow-up with a neurology specialist.  Recommend rest, taking it easy for the next couple days.  Avoid strenuous physical or mental exercises.  Recommend Tylenol and Zofran as needed for pain and nausea.  Return to ER if you develop vomiting, fever, chest pain or difficulty in breathing.

## 2020-02-07 NOTE — ED Triage Notes (Signed)
States she was hit with a flower base on Thurs , was seen by workers comp. C/o difficulty swallowing and increased  Headache today.

## 2020-02-10 ENCOUNTER — Encounter (HOSPITAL_COMMUNITY): Payer: Self-pay

## 2020-02-10 ENCOUNTER — Ambulatory Visit (HOSPITAL_COMMUNITY)
Admission: EM | Admit: 2020-02-10 | Discharge: 2020-02-10 | Disposition: A | Discharge status: Home / Self Care | Payer: 59 | Attending: Urgent Care | Admitting: Urgent Care

## 2020-02-10 ENCOUNTER — Other Ambulatory Visit: Payer: Self-pay

## 2020-02-10 DIAGNOSIS — R519 Headache, unspecified: Secondary | ICD-10-CM

## 2020-02-10 DIAGNOSIS — Z87828 Personal history of other (healed) physical injury and trauma: Secondary | ICD-10-CM

## 2020-02-10 DIAGNOSIS — Z8673 Personal history of transient ischemic attack (TIA), and cerebral infarction without residual deficits: Secondary | ICD-10-CM

## 2020-02-10 DIAGNOSIS — I10 Essential (primary) hypertension: Secondary | ICD-10-CM

## 2020-02-10 MED ORDER — LISINOPRIL 10 MG PO TABS: 10.0000 mg | ORAL_TABLET | Freq: Every day | ORAL | 0 refills | Status: DC

## 2020-02-10 MED ORDER — LISINOPRIL 10 MG PO TABS: 10.0000 mg | ORAL_TABLET | Freq: Every day | ORAL | Status: DC

## 2020-02-10 NOTE — ED Triage Notes (Signed)
Pt presents with ongoing headache since head injury a few days ago.  Pt also needs a refill on blood pressure medication

## 2020-02-10 NOTE — ED Provider Notes (Signed)
[place]   MRN: [id number] DOB: Jan 20, 1961  Subjective:   Debbie Howard is a 59 y.o. female presenting for persistent intermittent throbbing headaches over frontal area.  Patient had a head injury, concussion diagnosed 02/03/2020.  She was seen in the emergency room twice once a day and then again on 02/07/2020.  She has a history of CVA, last one per patient was 2009.  Head CT from 02/03/2020 was negative.  Patient is needing a medication refill for her lisinopril today.  Denies confusion, weakness, vision change, disorientation.  She has an appointment coming up with a neurologist.  No current facility-administered medications for this encounter.  Current Outpatient Medications:  .  aspirin EC 81 MG tablet, Take 81 mg by mouth every other day., Disp: , Rfl:  .  clindamycin (CLEOCIN) 150 MG capsule, Take 1 capsule (150 mg total) by mouth every 6 (six) hours., Disp: 28 capsule, Rfl: 0 .  lisinopril (ZESTRIL) 10 MG tablet, Take 1 tablet (10 mg total) by mouth daily., Disp: 30 tablet, Rfl: 1 .  Multiple Vitamin (DAILY VITAMINS PO), Take by mouth., Disp: , Rfl:  .  ondansetron (ZOFRAN ODT) 4 MG disintegrating tablet, Take 1 tablet (4 mg total) by mouth every 8 (eight) hours as needed for nausea or vomiting., Disp: 20 tablet, Rfl: 0   Allergies  Allergen Reactions  . Famotidine Swelling    REACTION: (per pt)  . Prednisone Hives and Swelling  . Iodine Hives and Rash    Past Medical History:  Diagnosis Date  . CVA (cerebral infarction) 2009   Right frontal lobe precentral gyrus infarct  . Hypertension   . 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   . Panic attacks   . SAB (spontaneous abortion)   . Stroke ([place])   . Transient ischemic attack 2009   x 3     Past Surgical History:  Procedure Laterality Date  . BREAST SURGERY      Family History  Problem Relation Age of Onset  . Stroke Mother   . Hypertension Mother   . Diabetes Brother     . Stroke Brother   . Heart failure Father     Social History   Tobacco Use  . Smoking status: Never Smoker  . Smokeless tobacco: Never Used  Substance Use Topics  . Alcohol use: No  . Drug use: No    ROS   Objective:   Vitals: BP 140/81 (BP Location: Right Arm)   Pulse 79   Temp 98.1 F (36.7 C) (Oral)   Resp 17   SpO2 97%   Physical Exam Constitutional:      General: She is not in acute distress.    Appearance: Normal appearance. She is well-developed. She is not ill-appearing, toxic-appearing or diaphoretic.  HENT:     Head: Normocephalic and atraumatic.     Nose: Nose normal.     Mouth/Throat:     Mouth: Mucous membranes are moist.  Eyes:     Extraocular Movements: Extraocular movements intact.     Pupils: Pupils are equal, round, and reactive to light.  Cardiovascular:     Rate and Rhythm: Normal rate and regular rhythm.     Pulses: Normal pulses.     Heart sounds: Normal heart sounds. No murmur. No friction rub. No gallop.   Pulmonary:     Effort: Pulmonary effort is normal. No respiratory distress.     Breath sounds: Normal breath sounds. No stridor.  No wheezing, rhonchi or rales.  Skin:    General: Skin is warm and dry.     Findings: No rash.  Neurological:     Mental Status: She is alert and oriented to person, place, and time.     Cranial Nerves: No cranial nerve deficit.     Motor: No weakness.     Coordination: Romberg sign negative. Coordination normal.     Gait: Gait normal.     Deep Tendon Reflexes: Reflexes normal.  Psychiatric:        Mood and Affect: Mood normal.        Speech: Speech normal.        Behavior: Behavior normal.        Thought Content: Thought content normal.      Assessment and Plan :   PDMP not reviewed this encounter.  1. Acute nonintractable headache, unspecified headache type   2. History of cerebrovascular accident   3. Essential hypertension   4. History of head injury     I offered medication to  patient for her headache but she adamantly refused.  She states that she solely wants to use aspirin.  I refilled her lisinopril and emphasized that with her history of CVA it is very important to have a low threshold to report to the emergency room should she develop worsening symptoms including weakness, slurred speech, confusion.  Otherwise she can follow-up with her neurologist. Counseled patient on potential for adverse effects with medications prescribed/recommended today, ER and return-to-clinic precautions discussed, patient verbalized understanding.    Jaynee Eagles, [place] 02/10/20 [id number]

## 2020-02-12 ENCOUNTER — Encounter (HOSPITAL_COMMUNITY): Payer: Self-pay | Admitting: Emergency Medicine

## 2020-02-15 ENCOUNTER — Emergency Department (HOSPITAL_COMMUNITY): Payer: 59

## 2020-02-15 ENCOUNTER — Emergency Department (HOSPITAL_COMMUNITY)
Admission: EM | Admit: 2020-02-15 | Discharge: 2020-02-15 | Disposition: A | Discharge status: Home / Self Care | Payer: 59 | Source: Home / Self Care | Attending: Emergency Medicine | Admitting: Emergency Medicine

## 2020-02-15 ENCOUNTER — Encounter (HOSPITAL_COMMUNITY): Payer: Self-pay

## 2020-02-15 ENCOUNTER — Other Ambulatory Visit: Payer: Self-pay

## 2020-02-15 ENCOUNTER — Emergency Department (HOSPITAL_COMMUNITY)
Admission: EM | Admit: 2020-02-15 | Discharge: 2020-02-15 | Disposition: A | Discharge status: Home / Self Care | Payer: 59 | Attending: Emergency Medicine | Admitting: Emergency Medicine

## 2020-02-15 DIAGNOSIS — Z5321 Procedure and treatment not carried out due to patient leaving prior to being seen by health care provider: Secondary | ICD-10-CM | POA: Diagnosis not present

## 2020-02-15 DIAGNOSIS — G44209 Tension-type headache, unspecified, not intractable: Secondary | ICD-10-CM | POA: Insufficient documentation

## 2020-02-15 DIAGNOSIS — Z86011 Personal history of benign neoplasm of the brain: Secondary | ICD-10-CM | POA: Insufficient documentation

## 2020-02-15 DIAGNOSIS — I10 Essential (primary) hypertension: Secondary | ICD-10-CM | POA: Insufficient documentation

## 2020-02-15 DIAGNOSIS — Z7982 Long term (current) use of aspirin: Secondary | ICD-10-CM | POA: Insufficient documentation

## 2020-02-15 DIAGNOSIS — Z79899 Other long term (current) drug therapy: Secondary | ICD-10-CM | POA: Insufficient documentation

## 2020-02-15 DIAGNOSIS — R079 Chest pain, unspecified: Secondary | ICD-10-CM | POA: Diagnosis present

## 2020-02-15 MED ORDER — SODIUM CHLORIDE 0.9% FLUSH: 3.0000 mL | Freq: Once | INTRAVENOUS | Status: DC

## 2020-02-15 NOTE — ED Triage Notes (Signed)
Patient c/o intermittent mid and left chest pain x 2 days. Patient denies SOB, diaphoretic, or N/V.

## 2020-02-15 NOTE — ED Triage Notes (Signed)
Patient c/ointermittent headache that started today after eating lunch.. Patient was in the ED earlier but left AMA and at that time she c/o chest pain and SOB

## 2020-02-15 NOTE — ED Notes (Signed)
I called patient in the lobby and outside for blood draw and no one responded

## 2020-02-15 NOTE — Discharge Instructions (Addendum)
We recommended obtaining further testing including EKG of your heart, lab work, CT scan of your head given the symptoms you are experiencing today.  If you change your mind or or you have any worsening of your symptoms, particularly if you have recurrent chest pain or difficulty breathing, worsening headaches, numbness, weakness, please return to the emergency room for reassessment.  Recommend following up with a primary care doctor.  If you do not have one, may call [facility] to help make this arrangement.

## 2020-02-15 NOTE — ED Notes (Signed)
Patient asked to undress and put on gown, pt. Stated she did not like doing that and placed gown over dress.

## 2020-02-16 NOTE — ED Provider Notes (Signed)
[place] [facility] DEPT Provider Note   CSN: [id number] Arrival date & time: 02/15/20  1537     History Chief Complaint  Patient presents with  . Headache    Debbie Howard is a 59 y.o. female.  Presented to ER with chief complaint of headache.  Reports that she has been having daily headaches ever since she suffered injury at work a couple weeks ago.  Headache today not worse than prior, dull, achy, moderate, primarily frontal.  No numbness, weakness, vision changes, speech changes.  Patient had come to our ER earlier in the day with a chief complaint of chest pain.  When attempting to question patient further about her episode of chest pain, she reported that she has been feeling more anxious lately and feels this is related to stress.  Her chest pain has resolved.  Described as sharp pain, would not provide any further detail regarding the nature of this pain.  Pain is currently gone.  Additional history obtained via chart review, review of recent notes  I evaluated and treated this patient at [facility] on April 12, the time she was complaining of headache, pain to suspect it was related to postconcussion syndrome from recent head injury.  HPI     Past Medical History:  Diagnosis Date  . CVA (cerebral infarction) 2009   Right frontal lobe precentral gyrus infarct  . Hypertension   . Meningioma ([place]) 2009    Probable right anterior frontal meningioma.   . Panic attacks   . SAB (spontaneous abortion)   . Stroke ([place])   . Transient ischemic attack 2009   x 3    Patient Active Problem List   Diagnosis Date Noted  . MENINGIOMA 03/14/2008  . CEREBROVASCULAR ACCIDENT 03/14/2008  . ABORTION, SPONTANEOUS 03/14/2008  . HYPERTENSION NEC 03/14/2008    Past Surgical History:  Procedure Laterality Date  . BREAST SURGERY       OB History   No obstetric history on file.     Family History  Problem Relation Age of Onset  . Stroke Mother   .  Hypertension Mother   . Diabetes Brother   . Stroke Brother   . Heart failure Father     Social History   Tobacco Use  . Smoking status: Never Smoker  . Smokeless tobacco: Never Used  Substance Use Topics  . Alcohol use: No  . Drug use: No    Home Medications Prior to Admission medications   Medication Sig Start Date End Date Taking? Authorizing Provider  aspirin EC 81 MG tablet Take 81 mg by mouth every other day.    [provider]  lisinopril (ZESTRIL) 10 MG tablet Take 1 tablet (10 mg total) by mouth daily. 02/10/20   Jaynee Eagles, PA-C  Multiple Vitamin (DAILY VITAMINS PO) Take by mouth.    [provider]  FLUoxetine (PROZAC) 10 MG tablet Take 1 tablet (10 mg total) by mouth daily. 01/09/19 11/11/19  Robyn Haber, MD    Allergies    Famotidine, Prednisone, and Iodine  Review of Systems   Review of Systems  Constitutional: Negative for chills and fever.  HENT: Negative for ear pain and sore throat.   Eyes: Negative for pain and visual disturbance.  Respiratory: Positive for shortness of breath. Negative for cough.   Cardiovascular: Positive for chest pain. Negative for palpitations.  Gastrointestinal: Negative for abdominal pain and vomiting.  Genitourinary: Negative for dysuria and hematuria.  Musculoskeletal: Negative for arthralgias and back  pain.  Skin: Negative for color change and rash.  Neurological: Positive for headaches. Negative for seizures and syncope.  All other systems reviewed and are negative.   Physical Exam Updated Vital Signs BP (!) 144/80   Pulse 66   Temp 98.4 F (36.9 C) (Oral)   Resp 18   Ht 5\' 7"  (1.702 m)   Wt 72.6 kg   SpO2 100%   BMI 25.06 kg/m   Physical Exam Vitals and nursing note reviewed.  Constitutional:      General: She is not in acute distress.    Appearance: She is well-developed.  HENT:     Head: Normocephalic and atraumatic.  Eyes:     Conjunctiva/sclera: Conjunctivae normal.    Cardiovascular:     Rate and Rhythm: Normal rate and regular rhythm.     Heart sounds: No murmur.  Pulmonary:     Effort: Pulmonary effort is normal. No respiratory distress.     Breath sounds: Normal breath sounds.  Abdominal:     Palpations: Abdomen is soft.     Tenderness: There is no abdominal tenderness.  Musculoskeletal:     Cervical back: Neck supple.  Skin:    General: Skin is warm and dry.  Neurological:     Mental Status: She is alert.     Comments: AAOx3 CN 2-12 intact, speech clear visual fields intact 5/5 strength in b/l UE and LE Sensation to light touch intact in b/l UE and LE Normal FNF Normal gait     ED Results / Procedures / Treatments   Labs (all labs ordered are listed, but only abnormal results are displayed) Labs Reviewed - No data to display  EKG None  Radiology No results found.  Procedures Procedures (including critical care time)  Medications Ordered in ED Medications - No data to display  ED Course  I have reviewed the triage vital signs and the nursing notes.  Pertinent labs & imaging results that were available during my care of the patient were reviewed by me and considered in my medical decision making (see chart for details).  Clinical Course as of Feb 15 125  Tue Feb 15, 2020  1944 Complete initial assessment, recommended obtaining labs, EKG, CT head, patient reports all her symptoms have gone and does not want to wait anymore and wishes to be discharged home   [RD]    Clinical Course User Index [RD] Lucrezia Starch, MD   MDM Rules/Calculators/A&P                      59 year old lady scented to ER with a chief complaint of headache.  Had checked into our ER earlier in the day with chest pain left without being seen.  After completing initial assessment, physical exam, I recommended obtaining CT head, EKG, labs to further investigate the complaints that she had reported to nursing staff in triage. While my clinical  suspicion for acute emergent medical condition is low, I explained in detail to patient benefits of this testing. However, patient states that her symptoms have resolved and she no longer wishes to receive any additional treatment, testing or observation.  Acknowledges my concerns and risks about leaving before further testing has been completed.  Will discharge per patient request. Stressed return precautions and patient reported willingness to return for any recurrence of her symptoms.    Final Clinical Impression(s) / ED Diagnoses Final diagnoses:  Tension headache    Rx / DC Orders ED Discharge  Orders    None       Lucrezia Starch, MD 02/16/20 [phone]

## 2020-02-18 ENCOUNTER — Ambulatory Visit (HOSPITAL_COMMUNITY)
Admission: EM | Admit: 2020-02-18 | Discharge: 2020-02-18 | Disposition: A | Discharge status: Home / Self Care | Payer: 59 | Attending: Family Medicine | Admitting: Family Medicine

## 2020-02-18 ENCOUNTER — Encounter (HOSPITAL_COMMUNITY): Payer: Self-pay | Admitting: Emergency Medicine

## 2020-02-18 ENCOUNTER — Other Ambulatory Visit: Payer: Self-pay

## 2020-02-18 DIAGNOSIS — I10 Essential (primary) hypertension: Secondary | ICD-10-CM

## 2020-02-18 DIAGNOSIS — J0100 Acute maxillary sinusitis, unspecified: Secondary | ICD-10-CM

## 2020-02-18 DIAGNOSIS — K0889 Other specified disorders of teeth and supporting structures: Secondary | ICD-10-CM

## 2020-02-18 MED ORDER — AMOXICILLIN-POT CLAVULANATE 875-125 MG PO TABS: 1.0000 | ORAL_TABLET | Freq: Two times a day (BID) | ORAL | 0 refills | Status: DC

## 2020-02-18 NOTE — Discharge Instructions (Signed)
Your blood pressure was noted to be elevated during your visit today. If you are currently taking medication for high blood pressure, please ensure you are taking this as directed. If you do not have a history of high blood pressure and your blood pressure remains persistently elevated, you may need to begin taking a medication at some point. You may return here within the next few days to recheck if unable to see your primary care provider or if do not have a one.  BP (!) 159/92 (BP Location: Left Arm)   Pulse 80   Temp 98.4 F (36.9 C) (Oral)   Resp 15   SpO2 99%

## 2020-02-18 NOTE — ED Triage Notes (Signed)
Patient states she has been having headaches and sinus pain and dental pain. She has had this worked up several times and was told there is not much they can do. She states that she had a metal flower pot fall on her head. This is an ongoing problem.

## 2020-02-21 NOTE — ED Provider Notes (Signed)
[place]   [id number] 02/18/20 Arrival Time: [id number]  ASSESSMENT & PLAN:  1. Pain, dental   2. Acute non-recurrent maxillary sinusitis   3. Elevated blood pressure reading in office with diagnosis of hypertension     No sign of abscess requiring I&D at this time. Discussed.  Begin: Meds ordered this encounter  Medications  . amoxicillin-clavulanate (AUGMENTIN) 875-125 MG tablet    Sig: Take 1 tablet by mouth every 12 (twelve) hours.    Dispense:  20 tablet    Refill:  0      Discharge Instructions     Your blood pressure was noted to be elevated during your visit today. If you are currently taking medication for high blood pressure, please ensure you are taking this as directed. If you do not have a history of high blood pressure and your blood pressure remains persistently elevated, you may need to begin taking a medication at some point. You may return here within the next few days to recheck if unable to see your primary care provider or if do not have a one.  BP (!) 159/92 (BP Location: Left Arm)   Pulse 80   Temp 98.4 F (36.9 C) (Oral)   Resp 15   SpO2 99%        Reviewed expectations re: course of current medical issues. Questions answered. Outlined signs and symptoms indicating need for more acute intervention. Patient verbalized understanding. After Visit Summary given.   SUBJECTIVE:  Debbie Howard is a 59 y.o. female who reports gradual onset of right upper dental pain described as aching/throbbing. Present for several days. Fever: absent. Tolerating PO intake but reports pain with chewing. Normal swallowing. She does not see a dentist regularly. No neck swelling or pain. OTC analgesics without relief. Also feels she has a sinus infection. Week or more; maxillary sinus pressure/pain. Increased blood pressure noted today. Reports that she is treated for hypertension. She reports no chest pain on exertion, no dyspnea on exertion, no swelling of  ankles, no orthostatic dizziness or lightheadedness, no orthopnea or paroxysmal nocturnal dyspnea and no palpitations.     OBJECTIVE: Vitals:   02/18/20 1753  BP: (!) 159/92  Pulse: 80  Resp: 15  Temp: 98.4 F (36.9 C)  TempSrc: Oral  SpO2: 99%    General appearance: alert; no distress HENT: normocephalic; atraumatic; dentition: fair; right upper gums without areas of fluctuance, drainage, or bleeding and with tenderness to palpation; normal jaw movement without difficulty; maxillary sinus TTP Neck: supple without LAD; FROM; trachea midline Lungs: normal respirations; unlabored Skin: warm and dry Psychological: alert and cooperative; normal mood and affect  Allergies  Allergen Reactions  . Famotidine Swelling    REACTION: (per pt)  . Prednisone Hives and Swelling  . Iodine Hives and Rash    Past Medical History:  Diagnosis Date  . CVA (cerebral infarction) 2009   Right frontal lobe precentral gyrus infarct  . Hypertension   . Meningioma ([place]) 2009    Probable right anterior frontal meningioma.   . Panic attacks   . SAB (spontaneous abortion)   . Stroke ([place])   . Transient ischemic attack 2009   x 3   Social History   Socioeconomic History  . Marital status: Single    Spouse name: Not on file  . Number of children: Not on file  . Years of education: Not on file  . Highest education level: Not on file  Occupational History  . Not on file  Tobacco Use  . Smoking status: Never Smoker  . Smokeless tobacco: Never Used  Substance and Sexual Activity  . Alcohol use: No  . Drug use: No  . Sexual activity: Not Currently  Other Topics Concern  . Not on file  Social History Narrative   Pt works at a [employer] Strain:   . Difficulty of Paying Living Expenses:   Food Insecurity:   . Worried About Charity fundraiser in the Last Year:   . Arboriculturist in the Last Year:   Transportation Needs:   . Consulting civil engineer (Medical):   Marland Kitchen Lack of Transportation (Non-Medical):   Physical Activity:   . Days of Exercise per Week:   . Minutes of Exercise per Session:   Stress:   . Feeling of Stress :   Social Connections:   . Frequency of Communication with Friends and Family:   . Frequency of Social Gatherings with Friends and Family:   . Attends Religious Services:   . Active Member of Clubs or Organizations:   . Attends Archivist Meetings:   Marland Kitchen Marital Status:   Intimate Partner Violence:   . Fear of Current or Ex-Partner:   . Emotionally Abused:   Marland Kitchen Physically Abused:   . Sexually Abused:    Family History  Problem Relation Age of Onset  . Stroke Mother   . Hypertension Mother   . Diabetes Brother   . Stroke Brother   . Heart failure Father    Past Surgical History:  Procedure Laterality Date  . BREAST SURGERY       Vanessa Kick, MD 02/21/20 [phone]

## 2020-02-26 ENCOUNTER — Emergency Department (HOSPITAL_COMMUNITY): Payer: 59

## 2020-02-26 ENCOUNTER — Emergency Department (HOSPITAL_COMMUNITY)
Admission: EM | Admit: 2020-02-26 | Discharge: 2020-02-26 | Disposition: A | Discharge status: Home / Self Care | Payer: 59 | Attending: Emergency Medicine | Admitting: Emergency Medicine

## 2020-02-26 ENCOUNTER — Other Ambulatory Visit: Payer: Self-pay

## 2020-02-26 ENCOUNTER — Encounter (HOSPITAL_COMMUNITY): Payer: Self-pay | Admitting: Emergency Medicine

## 2020-02-26 DIAGNOSIS — R079 Chest pain, unspecified: Secondary | ICD-10-CM | POA: Diagnosis not present

## 2020-02-26 DIAGNOSIS — H53149 Visual discomfort, unspecified: Secondary | ICD-10-CM | POA: Diagnosis not present

## 2020-02-26 DIAGNOSIS — I10 Essential (primary) hypertension: Secondary | ICD-10-CM | POA: Diagnosis not present

## 2020-02-26 DIAGNOSIS — R519 Headache, unspecified: Secondary | ICD-10-CM | POA: Diagnosis not present

## 2020-02-26 DIAGNOSIS — Z8673 Personal history of transient ischemic attack (TIA), and cerebral infarction without residual deficits: Secondary | ICD-10-CM | POA: Diagnosis not present

## 2020-02-26 DIAGNOSIS — Z79899 Other long term (current) drug therapy: Secondary | ICD-10-CM | POA: Diagnosis not present

## 2020-02-26 DIAGNOSIS — R002 Palpitations: Secondary | ICD-10-CM | POA: Diagnosis present

## 2020-02-26 LAB — BASIC METABOLIC PANEL
Anion gap: 8 (ref 5–15)
BUN: 14 mg/dL (ref 8–23)
CO2: 28 mmol/L (ref 22–32)
Calcium: 9.3 mg/dL (ref 8.9–10.3)
Chloride: 103 mmol/L (ref 98–111)
Creatinine, Ser: 0.75 mg/dL (ref 0.44–1.00)
GFR calc Af Amer: >60 mL/min (ref >60)
GFR calc non Af Amer: >60 mL/min (ref >60)
Glucose, Bld: 84 mg/dL (ref 70–99)
Potassium: 3.8 mmol/L (ref 3.5–5.1)
Sodium: 139 mmol/L (ref 135–145)

## 2020-02-26 LAB — CBC
HCT: 42.2 % (ref 36.0–46.0)
Hemoglobin: 13.7 g/dL (ref 12.0–15.0)
MCH: 28.9 pg (ref 26.0–34.0)
MCHC: 32.5 g/dL (ref 30.0–36.0)
MCV: 89 fL (ref 80.0–100.0)
Platelets: 283 10*3/uL (ref 150–400)
RBC: 4.74 MIL/uL (ref 3.87–5.11)
RDW: 12.4 % (ref 11.5–15.5)
WBC: 4.9 10*3/uL (ref 4.0–10.5)
nRBC: 0 % (ref 0.0–0.2)

## 2020-02-26 LAB — TROPONIN I (HIGH SENSITIVITY): Troponin I (High Sensitivity): 2 ng/L (ref <18)

## 2020-02-26 MED ORDER — SODIUM CHLORIDE 0.9% FLUSH: 3.0000 mL | Freq: Once | INTRAVENOUS | Status: DC

## 2020-02-26 NOTE — ED Notes (Signed)
Patient called for room placement x1 with no answer. 

## 2020-02-26 NOTE — ED Triage Notes (Signed)
Pt reports for past week having chest tightness that is intermittent. Reports had accident at work on April 8 where a shelf and metal pot fell on her head. Had headache every since.

## 2020-02-26 NOTE — ED Provider Notes (Signed)
[facility] DEPT Provider Note   CSN: [id number] Arrival date & time: 02/26/20  1212     History Chief Complaint  Patient presents with  . Palpitations  . Headache    Debbie Howard is a 59 y.o. female.  Pt With history of panic attacks, stroke, hypertension who presents to the ED with palpitations, headache, chest pain.  Patient states that she has had a headache for the last several weeks after she was hit by an object at work.  Is having daily headaches, photophobia.  Has also felt extremely anxious, palpitations, chest pain.  No current chest pain or palpitations at this time.  Has mild headache.  Has been taking Tylenol Motrin with improvement.  The history is provided by the patient.  Illness Severity:  Mild Onset quality:  Gradual Timing:  Intermittent Progression:  Waxing and waning Chronicity:  New Relieved by:  Nothing Worsened by:  Nothing Associated symptoms: chest pain and headaches   Associated symptoms: no abdominal pain, no cough, no ear pain, no fever, no rash, no shortness of breath, no sore throat and no vomiting        Past Medical History:  Diagnosis Date  . CVA (cerebral infarction) 2009   Right frontal lobe precentral gyrus infarct  . Hypertension   . Meningioma ([place]) 2009    Probable right anterior frontal meningioma.   . Panic attacks   . SAB (spontaneous abortion)   . Stroke ([place])   . Transient ischemic attack 2009   x 3    Patient Active Problem List   Diagnosis Date Noted  . MENINGIOMA 03/14/2008  . CEREBROVASCULAR ACCIDENT 03/14/2008  . ABORTION, SPONTANEOUS 03/14/2008  . HYPERTENSION NEC 03/14/2008    Past Surgical History:  Procedure Laterality Date  . BREAST SURGERY       OB History   No obstetric history on file.     Family History  Problem Relation Age of Onset  . Stroke Mother   . Hypertension Mother   . Diabetes Brother   . Stroke Brother   . Heart failure Father     Social  History   Tobacco Use  . Smoking status: Never Smoker  . Smokeless tobacco: Never Used  Substance Use Topics  . Alcohol use: No  . Drug use: No    Home Medications Prior to Admission medications   Medication Sig Start Date End Date Taking? Authorizing Provider  amoxicillin-clavulanate (AUGMENTIN) 875-125 MG tablet Take 1 tablet by mouth every 12 (twelve) hours. 02/18/20  Yes Vanessa Kick, MD  aspirin EC 325 MG tablet Take 325 mg by mouth daily.   Yes [provider]  lisinopril (ZESTRIL) 10 MG tablet Take 1 tablet (10 mg total) by mouth daily. 02/10/20  Yes Jaynee Eagles, PA-C  Multiple Vitamin (DAILY VITAMINS PO) Take by mouth.   Yes [provider]  FLUoxetine (PROZAC) 10 MG tablet Take 1 tablet (10 mg total) by mouth daily. 01/09/19 11/11/19  Robyn Haber, MD    Allergies    Famotidine, Prednisone, and Iodine  Review of Systems   Review of Systems  Constitutional: Negative for chills and fever.  HENT: Negative for ear pain and sore throat.   Eyes: Positive for photophobia. Negative for pain and visual disturbance.  Respiratory: Negative for cough and shortness of breath.   Cardiovascular: Positive for chest pain and palpitations.  Gastrointestinal: Negative for abdominal pain and vomiting.  Genitourinary: Negative for dysuria and hematuria.  Musculoskeletal: Negative for arthralgias  and back pain.  Skin: Negative for color change and rash.  Neurological: Positive for headaches. Negative for dizziness, tremors, seizures, syncope, facial asymmetry, speech difficulty, weakness, light-headedness and numbness.  All other systems reviewed and are negative.   Physical Exam Updated Vital Signs BP (!) 144/122 (BP Location: Right Arm)   Pulse 67   Temp 98 F (36.7 C)   Resp 16   SpO2 100%   Physical Exam Vitals and nursing note reviewed.  Constitutional:      General: She is not in acute distress.    Appearance: She is well-developed. She is not  ill-appearing.  HENT:     Head: Normocephalic and atraumatic.     Mouth/Throat:     Mouth: Mucous membranes are moist.  Eyes:     Extraocular Movements: Extraocular movements intact.     Right eye: Normal extraocular motion and no nystagmus.     Left eye: Normal extraocular motion and no nystagmus.     Conjunctiva/sclera: Conjunctivae normal.     Pupils: Pupils are equal, round, and reactive to light.  Cardiovascular:     Rate and Rhythm: Normal rate and regular rhythm.     Heart sounds: No murmur.  Pulmonary:     Effort: Pulmonary effort is normal. No respiratory distress.     Breath sounds: Normal breath sounds.  Abdominal:     Palpations: Abdomen is soft.     Tenderness: There is no abdominal tenderness.  Musculoskeletal:     Cervical back: Normal range of motion and neck supple.  Skin:    General: Skin is warm and dry.  Neurological:     Mental Status: She is alert and oriented to person, place, and time.     Cranial Nerves: No cranial nerve deficit or dysarthria.     Sensory: No sensory deficit.     Motor: No weakness.     Coordination: Coordination normal.     Comments: 5+ out of 5 strength throughout, normal sensation  Psychiatric:        Mood and Affect: Mood normal.     ED Results / Procedures / Treatments   Labs (all labs ordered are listed, but only abnormal results are displayed) Labs Reviewed  BASIC METABOLIC PANEL  CBC  TROPONIN I (HIGH SENSITIVITY)    EKG EKG Interpretation  Date/Time:  Saturday Feb 26 2020 16:21:38 EDT Ventricular Rate:  72 PR Interval:    QRS Duration: 91 QT Interval:  387 QTC Calculation: 424 R Axis:   14 Text Interpretation: Sinus rhythm Low voltage, precordial leads Confirmed by Lennice Sites [phone]) on 02/26/2020 4:29:31 PM   Radiology DG Chest 2 View  Result Date: 02/26/2020 CLINICAL DATA:  Intermittent chest tightness for for 4 days, headache since a work accident on 02/03/2020 when a shelf and metal pot fell on head,  history hypertension, stroke EXAM: CHEST - 2 VIEW COMPARISON:  04/11/2017 FINDINGS: Normal heart size and pulmonary vascularity. Small hiatal hernia. Linear scarring LEFT base. Remaining lungs clear. No pleural effusion or pneumothorax. Bones demineralized with dextroconvex thoracic scoliosis. IMPRESSION: LEFT basilar scarring. Small hiatal hernia. No acute abnormalities. Electronically Signed   By: Lavonia Dana M.D.   On: 02/26/2020 14:00    Procedures Procedures (including critical care time)  Medications Ordered in ED Medications  sodium chloride flush (NS) 0.9 % injection 3 mL (has no administration in time range)    ED Course  I have reviewed the triage vital signs and the nursing notes.  Pertinent labs &  imaging results that were available during my care of the patient were reviewed by me and considered in my medical decision making (see chart for details).    MDM Rules/Calculators/A&P                      Debbie Howard is a 59 year old female with history of hypertension, stroke who presents to the ED with palpitations, headache.  Normal vitals.  Patient with headache since an object hit her head several weeks ago.  Appears to have concussion type symptoms.  Normal neurological exam.  This occurred almost a month ago.  No concern for intracranial injury.  Patient also complained of palpitations, chest pain, shortness of breath, anxiety.  History of panic attacks.  EKG shows sinus rhythm.  Chest x-ray without signs of acute process including pneumothorax, pneumonia.  Patient with normal troponin.  No significant anemia, electrolyte abnormality, kidney injury.  Overall patient is asymptomatic at this time.  History and physical is not consistent with ACS or PE.  Likely stress related.  We will give her information to follow-up with the concussion specialist if needed.  Discharged in good condition.  This chart was dictated using voice recognition software.  Despite best efforts to  proofread,  errors can occur which can change the documentation meaning.    Final Clinical Impression(s) / ED Diagnoses Final diagnoses:  Bad headache  Palpitations    Rx / DC Orders ED Discharge Orders    None       Lennice Sites, DO 02/26/20 1638

## 2020-02-26 NOTE — ED Notes (Addendum)
Registration stated that patient c/o her breathing is worse and can't breathe. NT went to check patient's vitals due to her SOB complaint. NT states that when she was calling pt from lobby pt not answering and was outside with a yoga mat doing her stretches/exercises.

## 2020-04-23 ENCOUNTER — Other Ambulatory Visit: Payer: Self-pay

## 2020-04-23 ENCOUNTER — Encounter (HOSPITAL_COMMUNITY): Payer: Self-pay | Admitting: Emergency Medicine

## 2020-04-23 ENCOUNTER — Emergency Department (HOSPITAL_COMMUNITY)
Admission: EM | Admit: 2020-04-23 | Discharge: 2020-04-23 | Disposition: A | Discharge status: Home / Self Care | Payer: 59 | Attending: Emergency Medicine | Admitting: Emergency Medicine

## 2020-04-23 DIAGNOSIS — L03114 Cellulitis of left upper limb: Secondary | ICD-10-CM

## 2020-04-23 DIAGNOSIS — Z79899 Other long term (current) drug therapy: Secondary | ICD-10-CM | POA: Insufficient documentation

## 2020-04-23 DIAGNOSIS — Z8673 Personal history of transient ischemic attack (TIA), and cerebral infarction without residual deficits: Secondary | ICD-10-CM | POA: Diagnosis not present

## 2020-04-23 DIAGNOSIS — R2232 Localized swelling, mass and lump, left upper limb: Secondary | ICD-10-CM | POA: Diagnosis present

## 2020-04-23 DIAGNOSIS — I10 Essential (primary) hypertension: Secondary | ICD-10-CM | POA: Diagnosis not present

## 2020-04-23 LAB — CBC WITH DIFFERENTIAL/PLATELET
Abs Immature Granulocytes: 0.01 10*3/uL (ref 0.00–0.07)
Basophils Absolute: 0 10*3/uL (ref 0.0–0.1)
Basophils Relative: 0 %
Eosinophils Absolute: 0 10*3/uL (ref 0.0–0.5)
Eosinophils Relative: 1 %
HCT: 42.9 % (ref 36.0–46.0)
Hemoglobin: 13.7 g/dL (ref 12.0–15.0)
Immature Granulocytes: 0 %
Lymphocytes Relative: 23 %
Lymphs Abs: 1.3 10*3/uL (ref 0.7–4.0)
MCH: 28.8 pg (ref 26.0–34.0)
MCHC: 31.9 g/dL (ref 30.0–36.0)
MCV: 90.3 fL (ref 80.0–100.0)
Monocytes Absolute: 0.3 10*3/uL (ref 0.1–1.0)
Monocytes Relative: 6 %
Neutro Abs: 3.9 10*3/uL (ref 1.7–7.7)
Neutrophils Relative %: 70 %
Platelets: 261 10*3/uL (ref 150–400)
RBC: 4.75 MIL/uL (ref 3.87–5.11)
RDW: 13.2 % (ref 11.5–15.5)
WBC: 5.6 10*3/uL (ref 4.0–10.5)
nRBC: 0 % (ref 0.0–0.2)

## 2020-04-23 LAB — COMPREHENSIVE METABOLIC PANEL
ALT: 24 U/L (ref 0–44)
AST: 21 U/L (ref 15–41)
Albumin: 4.1 g/dL (ref 3.5–5.0)
Alkaline Phosphatase: 60 U/L (ref 38–126)
Anion gap: 11 (ref 5–15)
BUN: 12 mg/dL (ref 8–23)
CO2: 23 mmol/L (ref 22–32)
Calcium: 9.3 mg/dL (ref 8.9–10.3)
Chloride: 106 mmol/L (ref 98–111)
Creatinine, Ser: 0.69 mg/dL (ref 0.44–1.00)
GFR calc Af Amer: >60 mL/min (ref >60)
GFR calc non Af Amer: >60 mL/min (ref >60)
Glucose, Bld: 105 mg/dL — ABNORMAL HIGH (ref 70–99)
Potassium: 3.7 mmol/L (ref 3.5–5.1)
Sodium: 140 mmol/L (ref 135–145)
Total Bilirubin: 0.8 mg/dL (ref 0.3–1.2)
Total Protein: 7.6 g/dL (ref 6.5–8.1)

## 2020-04-23 LAB — LACTIC ACID, PLASMA: Lactic Acid, Venous: 1 mmol/L (ref 0.5–1.9)

## 2020-04-23 MED ORDER — DIPHENHYDRAMINE HCL 50 MG/ML IJ SOLN
25.0000 mg | Freq: Once | INTRAMUSCULAR | Status: DC
  Filled 2020-04-23: qty 1

## 2020-04-23 MED ORDER — SODIUM CHLORIDE 0.9% FLUSH
3.0000 mL | Freq: Once | INTRAVENOUS | Status: AC
  Administered 2020-04-23: 3 mL via INTRAVENOUS

## 2020-04-23 MED ORDER — HYDROCORTISONE 1 % EX CREA
TOPICAL_CREAM | Freq: Once | CUTANEOUS | Status: AC
  Filled 2020-04-23: qty 28

## 2020-04-23 MED ORDER — VANCOMYCIN HCL IN DEXTROSE 1-5 GM/200ML-% IV SOLN
1000.0000 mg | Freq: Once | INTRAVENOUS | Status: AC
  Administered 2020-04-23: 1000 mg via INTRAVENOUS
  Filled 2020-04-23: qty 200

## 2020-04-23 MED ORDER — DIPHENHYDRAMINE HCL 25 MG PO TABS
25.0000 mg | ORAL_TABLET | Freq: Four times a day (QID) | ORAL | 0 refills | Status: DC | PRN
Start: 2020-04-23 — End: 2020-11-20

## 2020-04-23 MED ORDER — SULFAMETHOXAZOLE-TRIMETHOPRIM 800-160 MG PO TABS
1.0000 | ORAL_TABLET | Freq: Two times a day (BID) | ORAL | 0 refills | Status: AC
Start: 2020-04-23 — End: 2020-04-30

## 2020-04-23 NOTE — ED Notes (Signed)
Patient given discharge instructions. Questions were answered. Patient verbalized understanding of discharge instructions and care at home.  

## 2020-04-23 NOTE — ED Provider Notes (Signed)
[facility] EMERGENCY DEPARTMENT Provider Note   CSN: [id number] Arrival date & time: 04/23/20  1005     History Chief Complaint  Patient presents with  . hand swelling    Debbie Howard is a 59 y.o. female.  Pt presents to the ED with left hand swelling.  Pt said she wore some "cheap jewelry" which made her hand break out.  Pt said she developed some sores on her hand.  She then cleaned with bleach and pine sol.  Left hand is still red and swollen.  It is starting to go up her wrist.  No f/c. It is not painful.        Past Medical History:  Diagnosis Date  . CVA (cerebral infarction) 2009   Right frontal lobe precentral gyrus infarct  . Hypertension   . Meningioma ([place]) 2009    Probable right anterior frontal meningioma.   . Panic attacks   . SAB (spontaneous abortion)   . Stroke ([place])   . Transient ischemic attack 2009   x 3    Patient Active Problem List   Diagnosis Date Noted  . MENINGIOMA 03/14/2008  . CEREBROVASCULAR ACCIDENT 03/14/2008  . ABORTION, SPONTANEOUS 03/14/2008  . HYPERTENSION NEC 03/14/2008    Past Surgical History:  Procedure Laterality Date  . BREAST SURGERY       OB History   No obstetric history on file.     Family History  Problem Relation Age of Onset  . Stroke Mother   . Hypertension Mother   . Diabetes Brother   . Stroke Brother   . Heart failure Father     Social History   Tobacco Use  . Smoking status: Never Smoker  . Smokeless tobacco: Never Used  Vaping Use  . Vaping Use: Never used  Substance Use Topics  . Alcohol use: No  . Drug use: No    Home Medications Prior to Admission medications   Medication Sig Start Date End Date Taking? Authorizing Provider  amoxicillin-clavulanate (AUGMENTIN) 875-125 MG tablet Take 1 tablet by mouth every 12 (twelve) hours. 02/18/20   Vanessa Kick, MD  aspirin EC 325 MG tablet Take 325 mg by mouth daily.    [provider]  diphenhydrAMINE (BENADRYL)  25 MG tablet Take 1 tablet (25 mg total) by mouth every 6 (six) hours as needed. 04/23/20   Isla Pence, MD  lisinopril (ZESTRIL) 10 MG tablet Take 1 tablet (10 mg total) by mouth daily. 02/10/20   Jaynee Eagles, PA-C  Multiple Vitamin (DAILY VITAMINS PO) Take by mouth.    [provider]  sulfamethoxazole-trimethoprim (BACTRIM DS) 800-160 MG tablet Take 1 tablet by mouth 2 (two) times daily for 7 days. 04/23/20 04/30/20  Isla Pence, MD  FLUoxetine (PROZAC) 10 MG tablet Take 1 tablet (10 mg total) by mouth daily. 01/09/19 11/11/19  Robyn Haber, MD    Allergies    Famotidine, Prednisone, and Iodine  Review of Systems   Review of Systems  Skin:       Left hand redness and swelling  All other systems reviewed and are negative.   Physical Exam Updated Vital Signs BP 138/73   Pulse (!) 55   Temp 98.4 F (36.9 C) (Oral)   Resp 19   Ht 5\' 7"  (1.702 m)   Wt 72.6 kg   SpO2 100%   BMI 25.06 kg/m   Physical Exam Vitals and nursing note reviewed.  Constitutional:      Appearance: Normal appearance.  HENT:     Head: Normocephalic and atraumatic.     Right Ear: External ear normal.     Left Ear: External ear normal.     Nose: Nose normal.     Mouth/Throat:     Mouth: Mucous membranes are moist.     Pharynx: Oropharynx is clear.  Eyes:     Extraocular Movements: Extraocular movements intact.     Conjunctiva/sclera: Conjunctivae normal.     Pupils: Pupils are equal, round, and reactive to light.  Cardiovascular:     Rate and Rhythm: Normal rate and regular rhythm.     Pulses: Normal pulses.     Heart sounds: Normal heart sounds.  Pulmonary:     Effort: Pulmonary effort is normal.     Breath sounds: Normal breath sounds.  Abdominal:     General: Abdomen is flat. Bowel sounds are normal.     Palpations: Abdomen is soft.  Musculoskeletal:        General: Normal range of motion.     Cervical back: Normal range of motion and neck supple.  Skin:    Capillary  Refill: Capillary refill takes less than 2 seconds.     Comments: Left hand swollen and red.  ? Allergy vs cellulitis.  No abscess to drain.  Neurological:     General: No focal deficit present.     Mental Status: She is alert and oriented to person, place, and time.  Psychiatric:        Mood and Affect: Mood normal.        Behavior: Behavior normal.     ED Results / Procedures / Treatments   Labs (all labs ordered are listed, but only abnormal results are displayed) Labs Reviewed  COMPREHENSIVE METABOLIC PANEL - Abnormal; Notable for the following components:      Result Value   Glucose, Bld 105 (*)    All other components within normal limits  LACTIC ACID, PLASMA  CBC WITH DIFFERENTIAL/PLATELET  LACTIC ACID, PLASMA    EKG None  Radiology No results found.  Procedures Procedures (including critical care time)  Medications Ordered in ED Medications  vancomycin (VANCOCIN) IVPB 1000 mg/200 mL premix (1,000 mg Intravenous New Bag/Given 04/23/20 1351)  diphenhydrAMINE (BENADRYL) injection 25 mg (25 mg Intravenous Refused 04/23/20 1352)  hydrocortisone cream 1 % (has no administration in time range)  sodium chloride flush (NS) 0.9 % injection 3 mL (3 mLs Intravenous Given 04/23/20 1351)    ED Course  I have reviewed the triage vital signs and the nursing notes.  Pertinent labs & imaging results that were available during my care of the patient were reviewed by me and considered in my medical decision making (see chart for details).    MDM Rules/Calculators/A&P                          Pt did not want the benadryl I ordered because she did not want to put too many meds "in her system."  She was ok with hydrocortisone cream.  Pt will be d/c after the vancomycin and d/c home with bactrim.  Return if worse.   Final Clinical Impression(s) / ED Diagnoses Final diagnoses:  Cellulitis of left hand    Rx / DC Orders ED Discharge Orders         Ordered     sulfamethoxazole-trimethoprim (BACTRIM DS) 800-160 MG tablet  2 times daily     Discontinue  Reprint  04/23/20 1421    diphenhydrAMINE (BENADRYL) 25 MG tablet  Every 6 hours PRN     Discontinue  Reprint     04/23/20 1421           Isla Pence, MD 04/23/20 1422

## 2020-04-23 NOTE — ED Triage Notes (Signed)
Woke up with L hand swelling this morning.  Denies pain.  States she thinks it may be related to "cheap jewelry" she was wearing.

## 2020-04-26 ENCOUNTER — Telehealth: Payer: Self-pay | Admitting: *Deleted

## 2020-04-26 NOTE — Telephone Encounter (Signed)
Pt called regarding which pharmacy Rx was e-scribed to.  RNCM reviewed chart to access After Visit Summary and found that Rx was sent to [facility] on [address].  Advised pt via voicemail to pick up Rx there.

## 2020-05-05 ENCOUNTER — Ambulatory Visit (HOSPITAL_COMMUNITY)
Admission: EM | Admit: 2020-05-05 | Discharge: 2020-05-05 | Disposition: A | Discharge status: Home / Self Care | Payer: 59 | Attending: Family Medicine | Admitting: Family Medicine

## 2020-05-05 ENCOUNTER — Encounter (HOSPITAL_COMMUNITY): Payer: Self-pay

## 2020-05-05 DIAGNOSIS — Z760 Encounter for issue of repeat prescription: Secondary | ICD-10-CM

## 2020-05-05 DIAGNOSIS — R030 Elevated blood-pressure reading, without diagnosis of hypertension: Secondary | ICD-10-CM

## 2020-05-05 MED ORDER — LISINOPRIL 10 MG PO TABS
10.0000 mg | ORAL_TABLET | Freq: Every day | ORAL | 0 refills | Status: DC
Start: 2020-05-05 — End: 2020-05-19

## 2020-05-05 NOTE — ED Triage Notes (Signed)
Pt c/o 3/10 throbbing pain in head. Pt denies N/V, light sensitivity, blurred vision. Pt also wants a rx refill lisinopril 10 mg.

## 2020-05-05 NOTE — Discharge Instructions (Signed)
I have provided a 30 day supply of your blood pressure medications.  We are unable to provide regular refills of your medications, therefore it is important that you follow up with a primary care provider for long term management of your medications.  If worsening of headache, chest pain , vision changes or otherwise worsening please return.

## 2020-05-06 NOTE — ED Provider Notes (Signed)
[facility]    CSN: [id number] Arrival date & time: 05/05/20  1846      History   Chief Complaint Chief Complaint  Patient presents with  . Headache    HPI Debbie Howard is a 59 y.o. female.   Debbie Howard presents with requests for refills of her lisinopril. Has noted her bp has been elevated from baseline into the 841'Y systolic when it is typically around 130. She has had a mild headache which she attributes to this. Noted this yesterday. No dizziness. No vision changes, no weakness, numbness or tingling, or confusion. No nausea or vomiting. No leg swelling. No chest pain  Or shortness of breath. History  Of htn, CVA as well as TIA's.    ROS per HPI, negative if not otherwise mentioned.      Past Medical History:  Diagnosis Date  . CVA (cerebral infarction) 2009   Right frontal lobe precentral gyrus infarct  . Hypertension   . Meningioma ([place]) 2009    Probable right anterior frontal meningioma.   . Panic attacks   . SAB (spontaneous abortion)   . Stroke ([place])   . Transient ischemic attack 2009   x 3    Patient Active Problem List   Diagnosis Date Noted  . MENINGIOMA 03/14/2008  . CEREBROVASCULAR ACCIDENT 03/14/2008  . ABORTION, SPONTANEOUS 03/14/2008  . HYPERTENSION NEC 03/14/2008    Past Surgical History:  Procedure Laterality Date  . BREAST SURGERY      OB History   No obstetric history on file.      Home Medications    Prior to Admission medications   Medication Sig Start Date End Date Taking? Authorizing Provider  amoxicillin-clavulanate (AUGMENTIN) 875-125 MG tablet Take 1 tablet by mouth every 12 (twelve) hours. 02/18/20   Vanessa Kick, MD  aspirin EC 325 MG tablet Take 325 mg by mouth daily.    [provider]  diphenhydrAMINE (BENADRYL) 25 MG tablet Take 1 tablet (25 mg total) by mouth every 6 (six) hours as needed. 04/23/20   Isla Pence, MD  lisinopril (ZESTRIL) 10 MG tablet Take 1 tablet (10 mg total) by mouth  daily. 05/05/20   Zigmund Gottron, NP  Multiple Vitamin (DAILY VITAMINS PO) Take by mouth.    [provider]  FLUoxetine (PROZAC) 10 MG tablet Take 1 tablet (10 mg total) by mouth daily. 01/09/19 11/11/19  Robyn Haber, MD    Family History Family History  Problem Relation Age of Onset  . Stroke Mother   . Hypertension Mother   . Diabetes Brother   . Stroke Brother   . Heart failure Father     Social History Social History   Tobacco Use  . Smoking status: Never Smoker  . Smokeless tobacco: Never Used  Vaping Use  . Vaping Use: Never used  Substance Use Topics  . Alcohol use: No  . Drug use: No     Allergies   Famotidine, Prednisone, and Iodine   Review of Systems Review of Systems   Physical Exam Triage Vital Signs ED Triage Vitals  Enc Vitals Group     BP 05/05/20 1931 (!) 148/69     Pulse Rate 05/05/20 1931 64     Resp 05/05/20 1931 16     Temp 05/05/20 1931 98.9 F (37.2 C)     Temp Source 05/05/20 1931 Oral     SpO2 05/05/20 1931 100 %     Weight 05/05/20 1932 159 lb (72.1 kg)  Height 05/05/20 1932 5' 6.5" (1.689 m)     Head Circumference --      Peak Flow --      Pain Score 05/05/20 1932 3     Pain Loc --      Pain Edu? --      Excl. in [place]? --    No data found.  Updated Vital Signs BP (!) 148/69   Pulse 64   Temp 98.9 F (37.2 C) (Oral)   Resp 16   Ht 5' 6.5" (1.689 m)   Wt 159 lb (72.1 kg)   SpO2 100%   BMI 25.28 kg/m   Visual Acuity Right Eye Distance:   Left Eye Distance:   Bilateral Distance:    Right Eye Near:   Left Eye Near:    Bilateral Near:     Physical Exam Constitutional:      General: She is not in acute distress.    Appearance: She is well-developed.  HENT:     Head: Normocephalic.  Cardiovascular:     Rate and Rhythm: Normal rate and regular rhythm.     Heart sounds: Normal heart sounds.  Pulmonary:     Effort: Pulmonary effort is normal.  Skin:    General: Skin is warm and dry.    Neurological:     Mental Status: She is alert and oriented to person, place, and time. Mental status is at baseline.     Cranial Nerves: No cranial nerve deficit or facial asymmetry.     Sensory: No sensory deficit.     Motor: No weakness.      UC Treatments / Results  Labs (all labs ordered are listed, but only abnormal results are displayed) Labs Reviewed - No data to display  EKG   Radiology No results found.  Procedures Procedures (including critical care time)  Medications Ordered in UC Medications - No data to display  Initial Impression / Assessment and Plan / UC Course  I have reviewed the triage vital signs and the nursing notes.  Pertinent labs & imaging results that were available during my care of the patient were reviewed by me and considered in my medical decision making (see chart for details).     Lisinopril provided for 30 day supply as appears last 90 day refill was also provided here at [facility]. Encouraged establish with PCP for long term management. Return precautions provided. Patient verbalized understanding and agreeable to plan.  Ambulatory out of clinic without difficulty.    Final Clinical Impressions(s) / UC Diagnoses   Final diagnoses:  Medication refill  Elevated blood pressure reading     Discharge Instructions     I have provided a 30 day supply of your blood pressure medications.  We are unable to provide regular refills of your medications, therefore it is important that you follow up with a primary care provider for long term management of your medications.  If worsening of headache, chest pain , vision changes or otherwise worsening please return.    ED Prescriptions    Medication Sig Dispense Auth. Provider   lisinopril (ZESTRIL) 10 MG tablet Take 1 tablet (10 mg total) by mouth daily. 30 tablet Zigmund Gottron, NP     PDMP not reviewed this encounter.   Zigmund Gottron, NP 05/06/20 2111

## 2020-05-09 ENCOUNTER — Emergency Department (HOSPITAL_COMMUNITY)
Admission: EM | Admit: 2020-05-09 | Discharge: 2020-05-09 | Disposition: A | Discharge status: Home / Self Care | Payer: 59 | Attending: Emergency Medicine | Admitting: Emergency Medicine

## 2020-05-09 ENCOUNTER — Other Ambulatory Visit: Payer: Self-pay

## 2020-05-09 ENCOUNTER — Encounter (HOSPITAL_COMMUNITY): Payer: Self-pay

## 2020-05-09 DIAGNOSIS — Z8673 Personal history of transient ischemic attack (TIA), and cerebral infarction without residual deficits: Secondary | ICD-10-CM | POA: Insufficient documentation

## 2020-05-09 DIAGNOSIS — Z79899 Other long term (current) drug therapy: Secondary | ICD-10-CM | POA: Insufficient documentation

## 2020-05-09 DIAGNOSIS — W2209XA Striking against other stationary object, initial encounter: Secondary | ICD-10-CM | POA: Insufficient documentation

## 2020-05-09 DIAGNOSIS — Y939 Activity, unspecified: Secondary | ICD-10-CM | POA: Insufficient documentation

## 2020-05-09 DIAGNOSIS — Y929 Unspecified place or not applicable: Secondary | ICD-10-CM | POA: Insufficient documentation

## 2020-05-09 DIAGNOSIS — I10 Essential (primary) hypertension: Secondary | ICD-10-CM | POA: Insufficient documentation

## 2020-05-09 DIAGNOSIS — Y999 Unspecified external cause status: Secondary | ICD-10-CM | POA: Diagnosis not present

## 2020-05-09 DIAGNOSIS — T148XXA Other injury of unspecified body region, initial encounter: Secondary | ICD-10-CM

## 2020-05-09 DIAGNOSIS — S40022A Contusion of left upper arm, initial encounter: Secondary | ICD-10-CM | POA: Insufficient documentation

## 2020-05-09 DIAGNOSIS — S4992XA Unspecified injury of left shoulder and upper arm, initial encounter: Secondary | ICD-10-CM | POA: Diagnosis present

## 2020-05-09 NOTE — ED Notes (Signed)
Patient verbalizes understanding of discharge instructions. Opportunity for questioning and answers were provided. Armband removed by staff, pt discharged from ED ambulatory.   

## 2020-05-09 NOTE — Discharge Instructions (Addendum)
Contact a health care provider if: °You have a fever. °The swelling or discoloration gets worse. °You develop more hematomas. °Get help right away if: °Your pain is worse or your pain is not controlled with medicine. °Your skin over the hematoma breaks or starts bleeding. °Your hematoma is in your chest or abdomen and you have weakness, shortness of breath, or a change in consciousness. °You have a hematoma on your scalp that is caused by a fall or injury, and you also have: °A headache that gets worse. °Trouble speaking or understanding speech. °Weakness. °Change in alertness or consciousness. °

## 2020-05-09 NOTE — ED Triage Notes (Signed)
Pt reports she hit her upper arm on a door this morning, bruising and redness noted. Pt concerned it is cellulitis. Pt seen last week for cellulitis in her hands.

## 2020-05-09 NOTE — ED Provider Notes (Signed)
[facility] EMERGENCY DEPARTMENT Provider Note   CSN: [id number] Arrival date & time: 05/09/20  2706     History Chief Complaint  Patient presents with  . Arm Pain    Debbie Howard is a 59 y.o. female with above medical history CVA, hypertension, anxiety and panic attacks, previous meningioma who presents emergency department with chief complaint of left arm injury. Patient recently had cellulitis secondary to allergic reaction to jewelry on her fingers. Her symptoms have resolved after treatment with antibiotics. Today she bumped her arm on a pole and got a little bruise. She is noticed that it turned little bit red and she is very afraid that she might be developing cellulitis again. Other than that it is mildly tender. She denies any mobility or strength issues with the left arm. She denies any numbness or tingling.  HPI     Past Medical History:  Diagnosis Date  . CVA (cerebral infarction) 2009   Right frontal lobe precentral gyrus infarct  . Hypertension   . 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   . Panic attacks   . SAB (spontaneous abortion)   . Stroke ([place])   . Transient ischemic attack 2009   x 3    Patient Active Problem List   Diagnosis Date Noted  . MENINGIOMA 03/14/2008  . CEREBROVASCULAR ACCIDENT 03/14/2008  . ABORTION, SPONTANEOUS 03/14/2008  . HYPERTENSION NEC 03/14/2008    Past Surgical History:  Procedure Laterality Date  . BREAST SURGERY       OB History   No obstetric history on file.     Family History  Problem Relation Age of Onset  . Stroke Mother   . Hypertension Mother   . Diabetes Brother   . Stroke Brother   . Heart failure Father     Social History   Tobacco Use  . Smoking status: Never Smoker  . Smokeless tobacco: Never Used  Vaping Use  . Vaping Use: Never used  Substance Use Topics  . Alcohol use: No  . Drug use: No    Home Medications Prior to Admission  medications   Medication Sig Start Date End Date Taking? Authorizing Provider  amoxicillin-clavulanate (AUGMENTIN) 875-125 MG tablet Take 1 tablet by mouth every 12 (twelve) hours. 02/18/20  Yes Vanessa Kick, MD  aspirin EC 325 MG tablet Take 325 mg by mouth 2 (two) times daily as needed for mild pain.    Yes [provider]  diphenhydrAMINE-zinc acetate (BENADRYL) cream Apply 1 application topically daily.   Yes [provider]  hydrocortisone cream 1 % Apply 1 application topically daily.   Yes [provider]  lisinopril (ZESTRIL) 10 MG tablet Take 1 tablet (10 mg total) by mouth daily. Patient taking differently: Take 20 mg by mouth daily.  05/05/20  Yes Augusto Gamble B, NP  Multiple Vitamin (DAILY VITAMINS PO) Take by mouth.   Yes [provider]  sulfamethoxazole-trimethoprim (BACTRIM) 400-80 MG tablet Take 1 tablet by mouth 2 (two) times daily.   Yes [provider]  diphenhydrAMINE (BENADRYL) 25 MG tablet Take 1 tablet (25 mg total) by mouth every 6 (six) hours as needed. 04/23/20   Isla Pence, MD  FLUoxetine (PROZAC) 10 MG tablet Take 1 tablet (10 mg total) by mouth daily. 01/09/19 11/11/19  Robyn Haber, MD    Allergies    Famotidine, Prednisone, and Iodine  Review of Systems   Review of Systems  Skin: Positive for color  change. Negative for wound.  Neurological: Negative for weakness and numbness.    Physical Exam Updated Vital Signs BP 138/88 (BP Location: Right Arm)   Pulse 82   Temp 98.2 F (36.8 C) (Oral)   Resp 20   Ht 5\' 7"  (1.702 m)   Wt 72.6 kg   SpO2 100%   BMI 25.06 kg/m   Physical Exam Vitals and nursing note reviewed.  Constitutional:      General: She is not in acute distress.    Appearance: She is well-developed. She is not diaphoretic.  HENT:     Head: Normocephalic and atraumatic.  Eyes:     General: No scleral icterus.    Conjunctiva/sclera: Conjunctivae normal.  Cardiovascular:     Rate and  Rhythm: Normal rate and regular rhythm.     Heart sounds: Normal heart sounds. No murmur heard.  No friction rub. No gallop.   Pulmonary:     Effort: Pulmonary effort is normal. No respiratory distress.     Breath sounds: Normal breath sounds.  Abdominal:     General: Bowel sounds are normal. There is no distension.     Palpations: Abdomen is soft. There is no mass.     Tenderness: There is no abdominal tenderness. There is no guarding.  Musculoskeletal:     Cervical back: Normal range of motion.  Skin:    General: Skin is warm and dry.     Findings: Bruising present.     Comments: Quarter sized bruise on the left upper forearm.   Neurological:     Mental Status: She is alert and oriented to person, place, and time.  Psychiatric:        Behavior: Behavior normal.     ED Results / Procedures / Treatments   Labs (all labs ordered are listed, but only abnormal results are displayed) Labs Reviewed - No data to display  EKG None  Radiology No results found.  Procedures Procedures (including critical care time)  Medications Ordered in ED Medications - No data to display  ED Course  I have reviewed the triage vital signs and the nursing notes.  Pertinent labs & imaging results that were available during my care of the patient were reviewed by me and considered in my medical decision making (see chart for details).    MDM Rules/Calculators/A&P                          Patient with bruise to the left upper forearm. Patient reassured that this is simply a bruise and she may apply ice. I discussed return precautions. She appears otherwise appropriate for discharge at this time. Final Clinical Impression(s) / ED Diagnoses Final diagnoses:  Hematoma    Rx / DC Orders ED Discharge Orders    None       Margarita Mail, PA-C 05/09/20 1457    Valarie Merino, MD 05/12/20 [id number]

## 2020-05-19 ENCOUNTER — Ambulatory Visit (HOSPITAL_COMMUNITY)
Admission: EM | Admit: 2020-05-19 | Discharge: 2020-05-19 | Disposition: A | Discharge status: Home / Self Care | Payer: 59 | Attending: Family Medicine | Admitting: Family Medicine

## 2020-05-19 ENCOUNTER — Encounter (HOSPITAL_COMMUNITY): Payer: Self-pay | Admitting: Emergency Medicine

## 2020-05-19 DIAGNOSIS — I10 Essential (primary) hypertension: Secondary | ICD-10-CM

## 2020-05-19 DIAGNOSIS — Z760 Encounter for issue of repeat prescription: Secondary | ICD-10-CM

## 2020-05-19 MED ORDER — LISINOPRIL 10 MG PO TABS
10.0000 mg | ORAL_TABLET | Freq: Every day | ORAL | 0 refills | Status: DC
Start: 2020-05-19 — End: 2020-06-23

## 2020-05-19 NOTE — ED Triage Notes (Signed)
Pt c/o losing her blood pressure medicine yesterday. Pt states that she has a headache today.

## 2020-05-19 NOTE — ED Provider Notes (Signed)
[place]    CSN: [id number] Arrival date & time: 05/19/20  1841      History   Chief Complaint Chief Complaint  Patient presents with   Medication Refill   Headache    HPI Debbie Howard is a 59 y.o. female.   Debbie Howard presents for refill of her lisinopril. She lost her bottle. She has a mild headache. No chest pain . No swelling. No shortness of breath. She has been unable to establish with a PCP but is interested in doing so.    ROS per HPI, negative if not otherwise mentioned.      Past Medical History:  Diagnosis Date   CVA (cerebral infarction) 2009   Right frontal lobe precentral gyrus infarct   Hypertension    Meningioma ([place]) 2009    Probable right anterior frontal meningioma.    Panic attacks    SAB (spontaneous abortion)    Stroke ([place])    Transient ischemic attack 2009   x 3    Patient Active Problem List   Diagnosis Date Noted   MENINGIOMA 03/14/2008   CEREBROVASCULAR ACCIDENT 03/14/2008   ABORTION, SPONTANEOUS 03/14/2008   HYPERTENSION NEC 03/14/2008    Past Surgical History:  Procedure Laterality Date   BREAST SURGERY      OB History   No obstetric history on file.      Home Medications    Prior to Admission medications   Medication Sig Start Date End Date Taking? Authorizing Provider  amoxicillin-clavulanate (AUGMENTIN) 875-125 MG tablet Take 1 tablet by mouth every 12 (twelve) hours. 02/18/20  Yes Vanessa Kick, MD  aspirin EC 325 MG tablet Take 325 mg by mouth 2 (two) times daily as needed for mild pain.    Yes [provider]  diphenhydrAMINE (BENADRYL) 25 MG tablet Take 1 tablet (25 mg total) by mouth every 6 (six) hours as needed. 04/23/20  Yes Isla Pence, MD  diphenhydrAMINE-zinc acetate (BENADRYL) cream Apply 1 application topically daily.   Yes [provider]  hydrocortisone cream 1 % Apply 1 application topically daily.   Yes [provider]  Multiple Vitamin  (DAILY VITAMINS PO) Take by mouth.   Yes [provider]  sulfamethoxazole-trimethoprim (BACTRIM) 400-80 MG tablet Take 1 tablet by mouth 2 (two) times daily.   Yes [provider]  lisinopril (ZESTRIL) 10 MG tablet Take 1 tablet (10 mg total) by mouth daily. 05/19/20   Zigmund Gottron, NP  FLUoxetine (PROZAC) 10 MG tablet Take 1 tablet (10 mg total) by mouth daily. 01/09/19 11/11/19  Robyn Haber, MD    Family History Family History  Problem Relation Age of Onset   Stroke Mother    Hypertension Mother    Diabetes Brother    Stroke Brother    Heart failure Father     Social History Social History   Tobacco Use   Smoking status: Never Smoker   Smokeless tobacco: Never Used  Scientific laboratory technician Use: Never used  Substance Use Topics   Alcohol use: No   Drug use: No     Allergies   Famotidine, Prednisone, and Iodine   Review of Systems Review of Systems   Physical Exam Triage Vital Signs ED Triage Vitals  Enc Vitals Group     BP 05/19/20 1951 (!) 137/93     Pulse Rate 05/19/20 1951 67     Resp 05/19/20 1951 16     Temp 05/19/20 1951 98.6 F (37 C)  Temp Source 05/19/20 1951 Oral     SpO2 05/19/20 1951 100 %     Weight --      Height --      Head Circumference --      Peak Flow --      Pain Score 05/19/20 1952 3     Pain Loc --      Pain Edu? --      Excl. in [place]? --    No data found.  Updated Vital Signs BP (!) 137/93 (BP Location: Right Arm)    Pulse 67    Temp 98.6 F (37 C) (Oral)    Resp 16    SpO2 100%   Visual Acuity Right Eye Distance:   Left Eye Distance:   Bilateral Distance:    Right Eye Near:   Left Eye Near:    Bilateral Near:     Physical Exam Constitutional:      General: She is not in acute distress.    Appearance: She is well-developed.  Cardiovascular:     Rate and Rhythm: Normal rate.  Pulmonary:     Effort: Pulmonary effort is normal.  Musculoskeletal:     Right lower leg: No edema.      Left lower leg: No edema.  Skin:    General: Skin is warm and dry.  Neurological:     Mental Status: She is alert and oriented to person, place, and time.      UC Treatments / Results  Labs (all labs ordered are listed, but only abnormal results are displayed) Labs Reviewed - No data to display  EKG   Radiology No results found.  Procedures Procedures (including critical care time)  Medications Ordered in UC Medications - No data to display  Initial Impression / Assessment and Plan / UC Course  I have reviewed the triage vital signs and the nursing notes.  Pertinent labs & imaging results that were available during my care of the patient were reviewed by me and considered in my medical decision making (see chart for details).     30 day supply provided. Encouraged follow up with pcp for long term management. Patient verbalized understanding and agreeable to plan.   Final Clinical Impressions(s) / UC Diagnoses   Final diagnoses:  Hypertension, unspecified type  Encounter for medication refill   Discharge Instructions   None    ED Prescriptions    Medication Sig Dispense Auth. Provider   lisinopril (ZESTRIL) 10 MG tablet Take 1 tablet (10 mg total) by mouth daily. 30 tablet Zigmund Gottron, NP     PDMP not reviewed this encounter.   Zigmund Gottron, NP 05/19/20 2003

## 2020-05-20 ENCOUNTER — Other Ambulatory Visit: Payer: Self-pay

## 2020-05-20 ENCOUNTER — Encounter (HOSPITAL_COMMUNITY): Payer: Self-pay

## 2020-05-20 ENCOUNTER — Emergency Department (HOSPITAL_COMMUNITY)
Admission: EM | Admit: 2020-05-20 | Discharge: 2020-05-21 | Disposition: A | Discharge status: Home / Self Care | Payer: 59 | Attending: Emergency Medicine | Admitting: Emergency Medicine

## 2020-05-20 DIAGNOSIS — R509 Fever, unspecified: Secondary | ICD-10-CM | POA: Insufficient documentation

## 2020-05-20 DIAGNOSIS — Z20822 Contact with and (suspected) exposure to covid-19: Secondary | ICD-10-CM | POA: Diagnosis not present

## 2020-05-20 DIAGNOSIS — M79605 Pain in left leg: Secondary | ICD-10-CM | POA: Insufficient documentation

## 2020-05-20 DIAGNOSIS — Z5321 Procedure and treatment not carried out due to patient leaving prior to being seen by health care provider: Secondary | ICD-10-CM | POA: Insufficient documentation

## 2020-05-20 LAB — CBC WITH DIFFERENTIAL/PLATELET
Abs Immature Granulocytes: 0.04 10*3/uL (ref 0.00–0.07)
Basophils Absolute: 0 10*3/uL (ref 0.0–0.1)
Basophils Relative: 0 %
Eosinophils Absolute: 0 10*3/uL (ref 0.0–0.5)
Eosinophils Relative: 0 %
HCT: 39.7 % (ref 36.0–46.0)
Hemoglobin: 13 g/dL (ref 12.0–15.0)
Immature Granulocytes: 1 %
Lymphocytes Relative: 5 %
Lymphs Abs: 0.3 10*3/uL — ABNORMAL LOW (ref 0.7–4.0)
MCH: 29.2 pg (ref 26.0–34.0)
MCHC: 32.7 g/dL (ref 30.0–36.0)
MCV: 89.2 fL (ref 80.0–100.0)
Monocytes Absolute: 0.2 10*3/uL (ref 0.1–1.0)
Monocytes Relative: 4 %
Neutro Abs: 4.8 10*3/uL (ref 1.7–7.7)
Neutrophils Relative %: 90 %
Platelets: 234 10*3/uL (ref 150–400)
RBC: 4.45 MIL/uL (ref 3.87–5.11)
RDW: 12.4 % (ref 11.5–15.5)
WBC: 5.3 10*3/uL (ref 4.0–10.5)
nRBC: 0 % (ref 0.0–0.2)

## 2020-05-20 LAB — COMPREHENSIVE METABOLIC PANEL
ALT: 22 U/L (ref 0–44)
AST: 18 U/L (ref 15–41)
Albumin: 3.8 g/dL (ref 3.5–5.0)
Alkaline Phosphatase: 54 U/L (ref 38–126)
Anion gap: 11 (ref 5–15)
BUN: 12 mg/dL (ref 8–23)
CO2: 24 mmol/L (ref 22–32)
Calcium: 9.1 mg/dL (ref 8.9–10.3)
Chloride: 97 mmol/L — ABNORMAL LOW (ref 98–111)
Creatinine, Ser: 0.77 mg/dL (ref 0.44–1.00)
GFR calc Af Amer: >60 mL/min (ref >60)
GFR calc non Af Amer: >60 mL/min (ref >60)
Glucose, Bld: 98 mg/dL (ref 70–99)
Potassium: 3.6 mmol/L (ref 3.5–5.1)
Sodium: 132 mmol/L — ABNORMAL LOW (ref 135–145)
Total Bilirubin: 0.7 mg/dL (ref 0.3–1.2)
Total Protein: 7.7 g/dL (ref 6.5–8.1)

## 2020-05-20 LAB — LACTIC ACID, PLASMA: Lactic Acid, Venous: 1 mmol/L (ref 0.5–1.9)

## 2020-05-20 MED ORDER — ACETAMINOPHEN 325 MG PO TABS
650.0000 mg | ORAL_TABLET | Freq: Once | ORAL | Status: AC
  Administered 2020-05-20: 650 mg via ORAL
  Filled 2020-05-20: qty 2

## 2020-05-20 MED ORDER — SODIUM CHLORIDE 0.9% FLUSH: 3.0000 mL | Freq: Once | INTRAVENOUS | Status: DC

## 2020-05-20 NOTE — ED Triage Notes (Signed)
Pt arrives to ED w/ c/o fever that started today. Pt denies cough, sob, n/v/d, abdominal pain.

## 2020-05-20 NOTE — ED Notes (Signed)
Pt stated that she will be waiting outside of [facility] ED lobby for an exam room due to the volume of patients in lobby.

## 2020-05-21 ENCOUNTER — Encounter (HOSPITAL_COMMUNITY): Payer: Self-pay | Admitting: Emergency Medicine

## 2020-05-21 ENCOUNTER — Ambulatory Visit (INDEPENDENT_AMBULATORY_CARE_PROVIDER_SITE_OTHER)
Admission: EM | Admit: 2020-05-21 | Discharge: 2020-05-21 | Disposition: A | Discharge status: Home / Self Care | Payer: 59 | Source: Home / Self Care

## 2020-05-21 ENCOUNTER — Other Ambulatory Visit: Payer: Self-pay

## 2020-05-21 DIAGNOSIS — Z1152 Encounter for screening for COVID-19: Secondary | ICD-10-CM | POA: Diagnosis not present

## 2020-05-21 DIAGNOSIS — M79605 Pain in left leg: Secondary | ICD-10-CM | POA: Diagnosis not present

## 2020-05-21 LAB — CBG MONITORING, ED
Glucose-Capillary: 59 mg/dL — ABNORMAL LOW (ref 70–99)
Glucose-Capillary: 60 mg/dL — ABNORMAL LOW (ref 70–99)

## 2020-05-21 NOTE — ED Triage Notes (Signed)
Patient has left leg pain today  Patient does not have a primary provider/ .  Patient has multiple complaints.    Patient reports pain behind knee.  Patient relates this to sugar elevation.  This makes patient want sugar checked Reports family history.    Reports temperature fluctuates between 100 and 97.9.  This makes patient want covid testing

## 2020-05-21 NOTE — Discharge Instructions (Addendum)
Your blood sugar is normal, I do not see anything that leads me to believe that you may have diabetes  Your COVID test is pending.  You should self quarantine until the test result is back.    Take Tylenol as needed for fever or discomfort.  Rest and keep yourself hydrated.    Go to the emergency department if you develop acute worsening symptoms.

## 2020-05-21 NOTE — ED Provider Notes (Signed)
[facility]    CSN: [id number] Arrival date & time: 05/21/20  1725      History   Chief Complaint Chief Complaint  Patient presents with  . Leg Pain    HPI Debbie Howard is a 59 y.o. female.   Reports that she has been experiencing left leg pain for the last 3 days. Reports that when she does not eat well and includes a lot of sugar in her diet, her joints ache. She voices concern about diabetes for this. Also reports family history of diabetes. Denies increased thirst, urinary frequency, increased hunger. Requesting blood sugar check. Reports that she is fasting today. Reports that she would also like Covid testing today. No hx Covid. Has completed Covid vaccines. Denies headache, cough, SOB, nausea, vomiting, diarrhea, rash, fever, other symptoms.  ROS per HPI  The history is provided by the patient.    Past Medical History:  Diagnosis Date  . CVA (cerebral infarction) 2009   Right frontal lobe precentral gyrus infarct  . Hypertension   . Meningioma ([place]) 2009    Probable right anterior frontal meningioma.   . Panic attacks   . SAB (spontaneous abortion)   . Stroke ([place])   . Transient ischemic attack 2009   x 3    Patient Active Problem List   Diagnosis Date Noted  . MENINGIOMA 03/14/2008  . CEREBROVASCULAR ACCIDENT 03/14/2008  . ABORTION, SPONTANEOUS 03/14/2008  . HYPERTENSION NEC 03/14/2008    Past Surgical History:  Procedure Laterality Date  . BREAST SURGERY      OB History   No obstetric history on file.      Home Medications    Prior to Admission medications   Medication Sig Start Date End Date Taking? Authorizing Provider  amoxicillin-clavulanate (AUGMENTIN) 875-125 MG tablet Take 1 tablet by mouth every 12 (twelve) hours. 02/18/20  Yes Vanessa Kick, MD  aspirin EC 325 MG tablet Take 325 mg by mouth 2 (two) times daily as needed for mild pain.     [provider]  diphenhydrAMINE (BENADRYL) 25 MG tablet Take 1 tablet (25 mg  total) by mouth every 6 (six) hours as needed. 04/23/20   Isla Pence, MD  diphenhydrAMINE-zinc acetate (BENADRYL) cream Apply 1 application topically daily.    [provider]  hydrocortisone cream 1 % Apply 1 application topically daily.    [provider]  lisinopril (ZESTRIL) 10 MG tablet Take 1 tablet (10 mg total) by mouth daily. 05/19/20   Zigmund Gottron, NP  Multiple Vitamin (DAILY VITAMINS PO) Take by mouth.    [provider]  sulfamethoxazole-trimethoprim (BACTRIM) 400-80 MG tablet Take 1 tablet by mouth 2 (two) times daily.    [provider]  FLUoxetine (PROZAC) 10 MG tablet Take 1 tablet (10 mg total) by mouth daily. 01/09/19 11/11/19  Robyn Haber, MD    Family History Family History  Problem Relation Age of Onset  . Stroke Mother   . Hypertension Mother   . Diabetes Brother   . Stroke Brother   . Heart failure Father     Social History Social History   Tobacco Use  . Smoking status: Never Smoker  . Smokeless tobacco: Never Used  Vaping Use  . Vaping Use: Never used  Substance Use Topics  . Alcohol use: No  . Drug use: No     Allergies   Famotidine, Prednisone, and Iodine   Review of Systems Review of Systems   Physical Exam Triage Vital Signs  ED Triage Vitals  Enc Vitals Group     BP 05/21/20 1751 (!) 142/86     Pulse Rate 05/21/20 1751 78     Resp 05/21/20 1751 20     Temp 05/21/20 1751 98.7 F (37.1 C)     Temp Source 05/21/20 1751 Oral     SpO2 05/21/20 1751 100 %     Weight --      Height --      Head Circumference --      Peak Flow --      Pain Score 05/21/20 1748 0     Pain Loc --      Pain Edu? --      Excl. in [place]? --    No data found.  Updated Vital Signs BP (!) 142/86 (BP Location: Right Arm)   Pulse 78   Temp 98.7 F (37.1 C) (Oral)   Resp 20   SpO2 100%      Physical Exam Vitals and nursing note reviewed.  Constitutional:      General: She is not in acute distress.     Appearance: Normal appearance. She is well-developed.  HENT:     Head: Normocephalic and atraumatic.     Right Ear: Tympanic membrane normal.     Left Ear: Tympanic membrane normal.     Nose: Nose normal.     Mouth/Throat:     Mouth: Mucous membranes are moist.     Pharynx: Oropharynx is clear.  Eyes:     Conjunctiva/sclera: Conjunctivae normal.  Cardiovascular:     Rate and Rhythm: Normal rate and regular rhythm.     Heart sounds: Normal heart sounds. No murmur heard.   Pulmonary:     Effort: Pulmonary effort is normal. No respiratory distress.     Breath sounds: Normal breath sounds. No stridor. No wheezing, rhonchi or rales.  Chest:     Chest wall: No tenderness.  Abdominal:     General: Bowel sounds are normal.     Palpations: Abdomen is soft.     Tenderness: There is no abdominal tenderness.  Musculoskeletal:        General: Normal range of motion.     Cervical back: Normal range of motion and neck supple.  Skin:    General: Skin is warm and dry.     Capillary Refill: Capillary refill takes less than 2 seconds.  Neurological:     General: No focal deficit present.     Mental Status: She is alert and oriented to person, place, and time.  Psychiatric:        Mood and Affect: Mood normal.        Thought Content: Thought content normal.      UC Treatments / Results  Labs (all labs ordered are listed, but only abnormal results are displayed) Labs Reviewed  CBG MONITORING, ED - Abnormal; Notable for the following components:      Result Value   Glucose-Capillary 59 (*)    All other components within normal limits  CBG MONITORING, ED - Abnormal; Notable for the following components:   Glucose-Capillary 60 (*)    All other components within normal limits  SARS CORONAVIRUS 2 (TAT 6-24 HRS)    EKG   Radiology No results found.  Procedures Procedures (including critical care time)  Medications Ordered in UC Medications - No data to display  Initial  Impression / Assessment and Plan / UC Course  I have reviewed the triage vital signs and  the nursing notes.  Pertinent labs & imaging results that were available during my care of the patient were reviewed by me and considered in my medical decision making (see chart for details).     Leg Pain  CBG 60, no concern for diabetes today Discussed early symptoms of diabetes with patient She is not displaying these Cut down on sugar in the diet if this is affecting your joints  Covid swab obtained in office today.  Patient instructed to quarantine until results are back and negative.  If results are negative, patient may resume daily schedule as tolerated once they are fever free for 24 hours without the use of antipyretic medications.  If results are positive, patient instructed to quarantine 10 days from today.  Patient instructed to follow-up with primary care with this office as needed.  Patient instructed to follow-up in the ER for trouble swallowing, trouble breathing, other concerning symptoms.  Final Clinical Impressions(s) / UC Diagnoses   Final diagnoses:  Left leg pain  Encounter for screening for COVID-19     Discharge Instructions     Your blood sugar is normal, I do not see anything that leads me to believe that you may have diabetes  Your COVID test is pending.  You should self quarantine until the test result is back.    Take Tylenol as needed for fever or discomfort.  Rest and keep yourself hydrated.    Go to the emergency department if you develop acute worsening symptoms.        ED Prescriptions    None     PDMP not reviewed this encounter.   Faustino Congress, NP 05/25/20 [phone]

## 2020-05-21 NOTE — ED Notes (Signed)
No answer for vitals, multiple times

## 2020-05-22 LAB — SARS CORONAVIRUS 2 (TAT 6-24 HRS): SARS Coronavirus 2: NEGATIVE

## 2020-05-23 ENCOUNTER — Other Ambulatory Visit: Payer: Self-pay | Admitting: *Deleted

## 2020-05-23 NOTE — Patient Outreach (Signed)
[place] [facility]) Care Management  05/23/2020  Debbie Howard 19-Oct-1961 [id number]   Covering for assigned RNCM, L. Zigmund Daniel.  Referral Date: 7/27 Referral Source: Insurance Referral Reason: High ED utilization, 15 ED visits in the last 6 months Insurance: [place] attempt #1, unsuccessful, HIPAA compliant voice message left.  Plan: RN CM will send unsuccessful outreach letter and have assigned RNCM follow up within the next 4 business days.  Valente David, [place], MSN [place] [phone]

## 2020-05-29 ENCOUNTER — Other Ambulatory Visit: Payer: Self-pay | Admitting: *Deleted

## 2020-05-29 NOTE — Patient Outreach (Signed)
[place] [facility]) Care Management  05/29/2020  Debbie Howard 04-Apr-1961 [id number]   Covering for assigned RNCM, L. Zigmund Daniel.  Referral Date: 7/27 Referral Source: Insurance Referral Reason: High ED utilization, 15 ED visits in the last 6 months Insurance: [place] attempt #2, unsuccessful, HIPAA compliant voice message left.  Plan: RN CM will provide update to assigned care manager to follow up within the next 3-4 business days.  Valente David, [place], MSN [place] [phone]

## 2020-06-01 ENCOUNTER — Other Ambulatory Visit: Payer: Self-pay | Admitting: *Deleted

## 2020-06-01 NOTE — Patient Outreach (Signed)
[facility]) Care Management  06/01/2020  Debbie Howard 09-22-1961 [id number]  Referral Received 7/27 Outreach #3  RN attempted to contact this pt on all available contact numbers noted via Epic. Both the home and mobile numbers for this pt were unsuccessful and RN unable to leave a HIPAA message however another number listed for a Debbie Howard [phone] RN able to leave a HIPAA approved voice message requesting a call back although the voice message indicated another name Debbie Cool [place]?).   Plan: Will await a call back and plan to follow up in a few weeks.   Note pt was discussed today in difficult case discussion with [facility] representative.   Debbie Mina, RN Care Management Coordinator [place] Office [phone]

## 2020-06-02 ENCOUNTER — Ambulatory Visit: Payer: Self-pay | Admitting: *Deleted

## 2020-06-23 ENCOUNTER — Ambulatory Visit (HOSPITAL_COMMUNITY)
Admission: EM | Admit: 2020-06-23 | Discharge: 2020-06-23 | Disposition: A | Discharge status: Home / Self Care | Payer: 59 | Attending: Family Medicine | Admitting: Family Medicine

## 2020-06-23 ENCOUNTER — Other Ambulatory Visit: Payer: Self-pay

## 2020-06-23 ENCOUNTER — Encounter (HOSPITAL_COMMUNITY): Payer: Self-pay

## 2020-06-23 DIAGNOSIS — M545 Low back pain, unspecified: Secondary | ICD-10-CM

## 2020-06-23 DIAGNOSIS — K0889 Other specified disorders of teeth and supporting structures: Secondary | ICD-10-CM | POA: Diagnosis not present

## 2020-06-23 DIAGNOSIS — K047 Periapical abscess without sinus: Secondary | ICD-10-CM | POA: Diagnosis not present

## 2020-06-23 DIAGNOSIS — Z760 Encounter for issue of repeat prescription: Secondary | ICD-10-CM

## 2020-06-23 DIAGNOSIS — T148XXA Other injury of unspecified body region, initial encounter: Secondary | ICD-10-CM

## 2020-06-23 MED ORDER — LISINOPRIL 10 MG PO TABS
10.0000 mg | ORAL_TABLET | Freq: Every day | ORAL | 3 refills | Status: DC
Start: 2020-06-23 — End: 2020-09-09

## 2020-06-23 MED ORDER — CYCLOBENZAPRINE HCL 10 MG PO TABS
10.0000 mg | ORAL_TABLET | Freq: Two times a day (BID) | ORAL | 0 refills | Status: DC | PRN
Start: 2020-06-23 — End: 2020-09-09

## 2020-06-23 MED ORDER — CLINDAMYCIN HCL 150 MG PO CAPS
150.0000 mg | ORAL_CAPSULE | Freq: Four times a day (QID) | ORAL | 0 refills | Status: DC
Start: 2020-06-23 — End: 2020-09-09

## 2020-06-23 NOTE — ED Triage Notes (Signed)
Pt presents for medication refill: lisinopril 10 mg and muscle relaxer.

## 2020-06-23 NOTE — Discharge Instructions (Signed)
Take the antibiotic as prescribed.    A dental resource guide is attached.  Please call to make an appointment with a dentist as soon as possible.    Go to the emergency department if you have acute worsening symptoms.   Take ibuprofen as needed for your pain.    Take the muscle relaxer Flexeril as needed for muscle spasm; Do not drive, operate machinery, or drink alcohol with this medication as it may make you drowsy.    Follow up with your primary care provider or an orthopedist if your pain is not improving.    Refilled lisinopril as well  Please get established with primary care

## 2020-06-23 NOTE — ED Provider Notes (Signed)
[place]   [id number] 06/23/20 Arrival Time: 1416  [id number] PAIN  SUBJECTIVE: History from: patient. Debbie Howard is a 59 y.o. female complains of low back pain that began about a week ago while she was at work.  Reports that she thinks that she may have pulled a muscle.  Has  taken OTC medications for this with temporary relief.  States that the pain is worse with activity.  Also reports dental pain.  Has had recurrent dental infections.  Reports that the pain is worse with chewing states that she is in the process of getting set up with a dentist.  Requesting medication refill on lisinopril.  Still has not gotten a with primary care. Denies similar symptoms in the past.  Denies fever, chills, erythema, ecchymosis, effusion, weakness, numbness and tingling, saddle paresthesias, loss of bowel or bladder function.      ROS: As per HPI.  All other pertinent ROS negative.     Past Medical History:  Diagnosis Date  . CVA (cerebral infarction) 2009   Right frontal lobe precentral gyrus infarct  . Hypertension   . 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   . Panic attacks   . SAB (spontaneous abortion)   . Stroke ([place])   . Transient ischemic attack 2009   x 3   Past Surgical History:  Procedure Laterality Date  . BREAST SURGERY     Allergies  Allergen Reactions  . Famotidine Swelling    REACTION: (per pt)  . Prednisone Hives and Swelling  . Iodine Hives and Rash   No current facility-administered medications on file prior to encounter.   Current Outpatient Medications on File Prior to Encounter  Medication Sig Dispense Refill  . amoxicillin-clavulanate (AUGMENTIN) 875-125 MG tablet Take 1 tablet by mouth every 12 (twelve) hours. 20 tablet 0  . aspirin EC 325 MG tablet Take 325 mg by mouth 2 (two) times daily as needed for mild pain.     . diphenhydrAMINE (BENADRYL) 25 MG tablet Take 1 tablet (25 mg total) by mouth every 6 (six) hours as  needed. 30 tablet 0  . diphenhydrAMINE-zinc acetate (BENADRYL) cream Apply 1 application topically daily.    . hydrocortisone cream 1 % Apply 1 application topically daily.    . Multiple Vitamin (DAILY VITAMINS PO) Take by mouth.    . sulfamethoxazole-trimethoprim (BACTRIM) 400-80 MG tablet Take 1 tablet by mouth 2 (two) times daily.    . [DISCONTINUED] FLUoxetine (PROZAC) 10 MG tablet Take 1 tablet (10 mg total) by mouth daily. 30 tablet 3   Social History   Socioeconomic History  . Marital status: Single    Spouse name: Not on file  . Number of children: Not on file  . Years of education: Not on file  . Highest education level: Not on file  Occupational History  . Not on file  Tobacco Use  . Smoking status: Never Smoker  . Smokeless tobacco: Never Used  Vaping Use  . Vaping Use: Never used  Substance and Sexual Activity  . Alcohol use: No  . Drug use: No  . Sexual activity: Not Currently  Other Topics Concern  . Not on file  Social History Narrative   Pt works at a [employer] Strain:   . Difficulty of Paying Living Expenses: Not on file  Food Insecurity:   . Worried About Charity fundraiser in the Last  Year: Not on file  . Ran Out of Food in the Last Year: Not on file  Transportation Needs:   . Lack of Transportation (Medical): Not on file  . Lack of Transportation (Non-Medical): Not on file  Physical Activity:   . Days of Exercise per Week: Not on file  . Minutes of Exercise per Session: Not on file  Stress:   . Feeling of Stress : Not on file  Social Connections:   . Frequency of Communication with Friends and Family: Not on file  . Frequency of Social Gatherings with Friends and Family: Not on file  . Attends Religious Services: Not on file  . Active Member of Clubs or Organizations: Not on file  . Attends Archivist Meetings: Not on file  . Marital Status: Not on file  Intimate Partner  Violence:   . Fear of Current or Ex-Partner: Not on file  . Emotionally Abused: Not on file  . Physically Abused: Not on file  . Sexually Abused: Not on file   Family History  Problem Relation Age of Onset  . Stroke Mother   . Hypertension Mother   . Diabetes Brother   . Stroke Brother   . Heart failure Father     OBJECTIVE:  Vitals:   06/23/20 1608  BP: 133/80  Pulse: 69  Resp: 18  Temp: 98.8 F (37.1 C)  TempSrc: Oral  SpO2: 100%    General appearance: ALERT; in no acute distress.  Head: NCAT Mouth: broken tooth to right upper jaw Lungs: Normal respiratory effort CV: pulses 2+ bilaterally. Cap refill < 2 seconds Musculoskeletal:  Inspection: Skin warm, dry, clear and intact without obvious erythema, effusion, or ecchymosis.  Palpation: Nontender to palpation ROM: FROM active and passive Skin: warm and dry Neurologic: Ambulates without difficulty; Sensation intact about the upper/ lower extremities Psychological: alert and cooperative; normal mood and affect  DIAGNOSTIC STUDIES:  No results found.   ASSESSMENT & PLAN:  1. Acute bilateral low back pain without sciatica   2. Medication refill   3. Pain, dental   4. Dental infection   5. Muscle strain     Meds ordered this encounter  Medications  . lisinopril (ZESTRIL) 10 MG tablet    Sig: Take 1 tablet (10 mg total) by mouth daily.    Dispense:  30 tablet    Refill:  3    Order Specific Question:   Supervising Provider    Answer:   Chase Picket [id number]  . cyclobenzaprine (FLEXERIL) 10 MG tablet    Sig: Take 1 tablet (10 mg total) by mouth 2 (two) times daily as needed for muscle spasms.    Dispense:  20 tablet    Refill:  0    Order Specific Question:   Supervising Provider    Answer:   Chase Picket [id number]  . clindamycin (CLEOCIN) 150 MG capsule    Sig: Take 1 capsule (150 mg total) by mouth every 6 (six) hours.    Dispense:  28 capsule    Refill:  0    Order Specific Question:    Supervising Provider    Answer:   Chase Picket [[id number]]   Refill lisinopril Prescribed clindamycin for tooth infection Included dental resource for her to get established with dental care Discussed with patient that she needs to get established with primary care to have her blood pressure and medications more closely monitored Verbalizes understanding and is in agreement Continue conservative management of rest,  ice, and gentle stretches Take ibuprofen as needed for pain relief (may cause abdominal discomfort, ulcers, and GI bleeds avoid taking with other NSAIDs) Take cyclobenzaprine at nighttime for symptomatic relief. Avoid driving or operating heavy machinery while using medication. Follow up with PCP if symptoms persist Return or go to the ER if you have any new or worsening symptoms (fever, chills, chest pain, abdominal pain, changes in bowel or bladder habits, pain radiating into lower legs)   [place] Controlled Substances Registry consulted for this patient. I feel the risk/benefit ratio today is favorable for proceeding with this prescription for a controlled substance. Medication sedation precautions given.  Reviewed expectations re: course of current medical issues. Questions answered. Outlined signs and symptoms indicating need for more acute intervention. Patient verbalized understanding. After Visit Summary given.       Faustino Congress, NP 06/23/20 1643

## 2020-06-28 ENCOUNTER — Other Ambulatory Visit: Payer: Self-pay | Admitting: *Deleted

## 2020-06-28 NOTE — Patient Outreach (Addendum)
[facility]) Care Management  06/28/2020  Debbie Howard 1961/02/27 [id number]   Telephone Assessment-Unsuccessful (Case Closure)  Several outreach call made to this pt however unsuccessful. Contacted all available contact numbers however unsuccessful and unable to leave a HIPPA voice message.   Plan: Based upon the unsuccessful contacts will close this case however no provider noted and unable to send closure letter.  Raina Mina, RN Care Management Coordinator [place] [facility] [phone]

## 2020-07-13 IMAGING — CT CT HEAD W/O CM
3 series · 15 of 47 positions shown, 18 images · non-contrast
Comparison: 11/07/2018

CLINICAL DATA: Facial trauma.  Fall.

EXAM:
CT HEAD WITHOUT CONTRAST
TECHNIQUE: Contiguous axial images were obtained from the base of the skull
through the vertex without intravenous contrast.

[Series 2: head 5.0 h30s · axial · 0.43mm/px · z∈[-144,-9]mm · 9 of 33 slices shown, 12 images]
[im 3/33  brain]
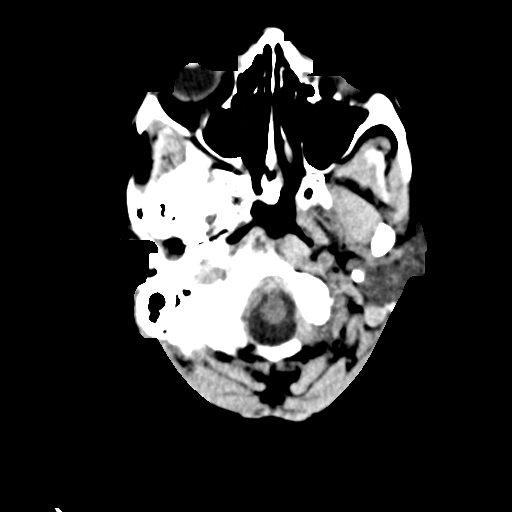
[im 3/33  bone]
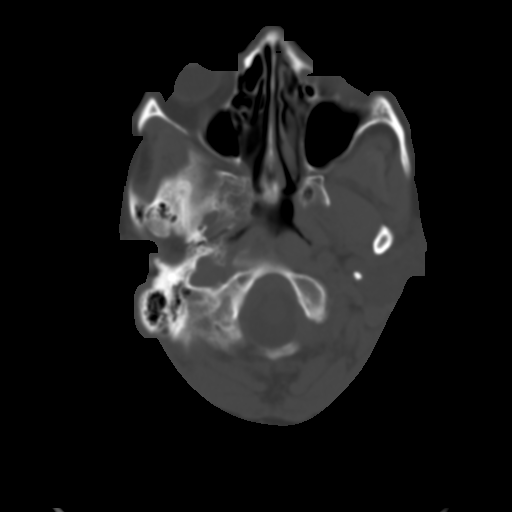
[im 6/33  brain]
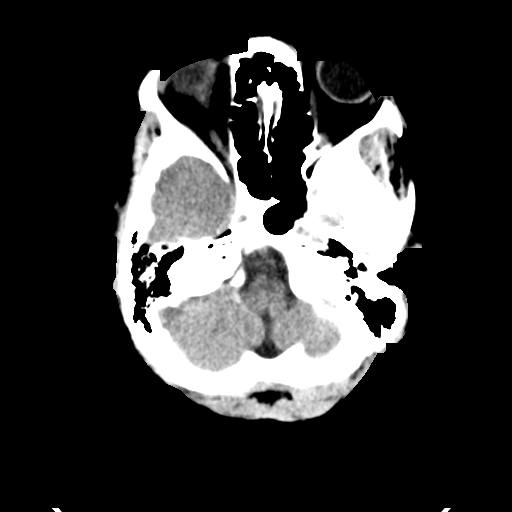
[im 9/33  brain]
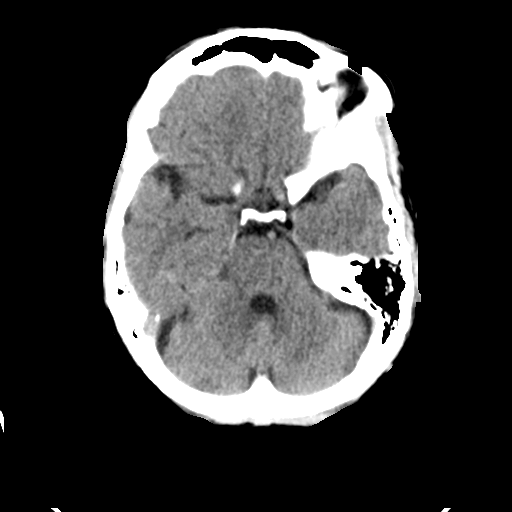
[im 13/33  brain]
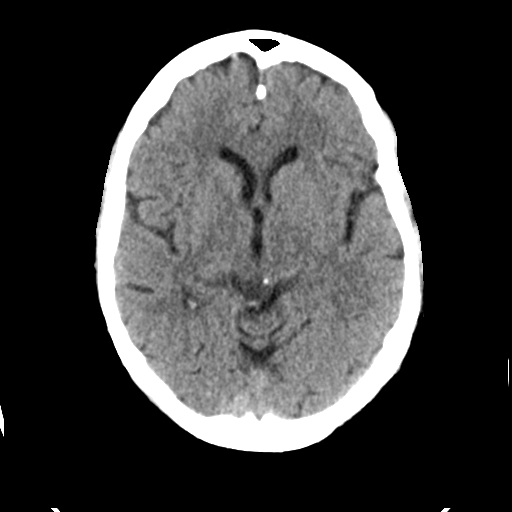
[im 17/33  brain]
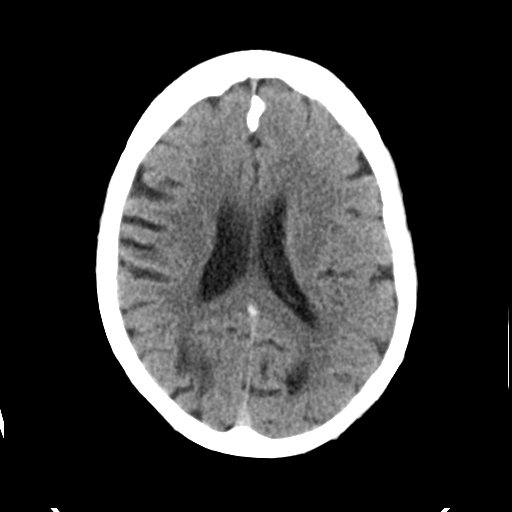
[im 17/33  bone]
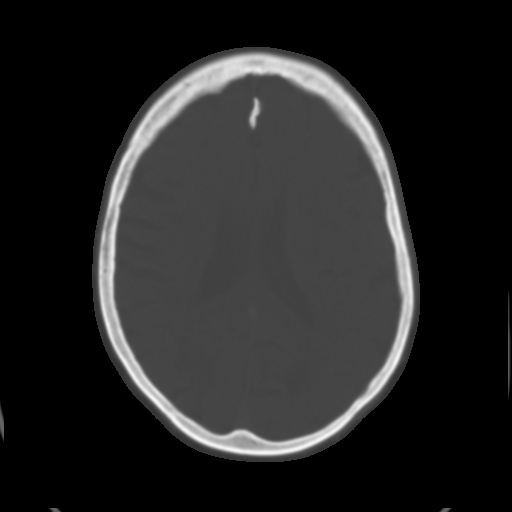
[im 20/33  brain]
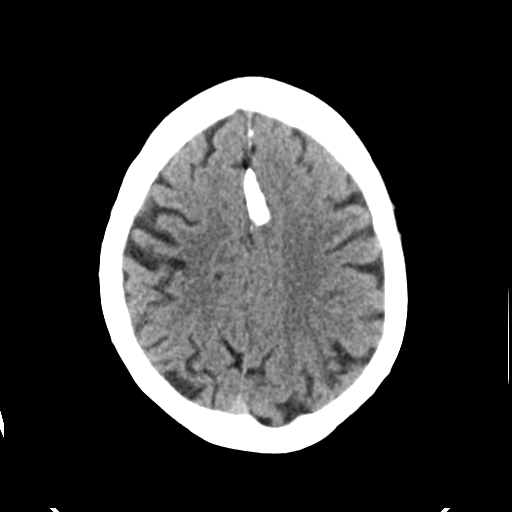
[im 24/33  brain]
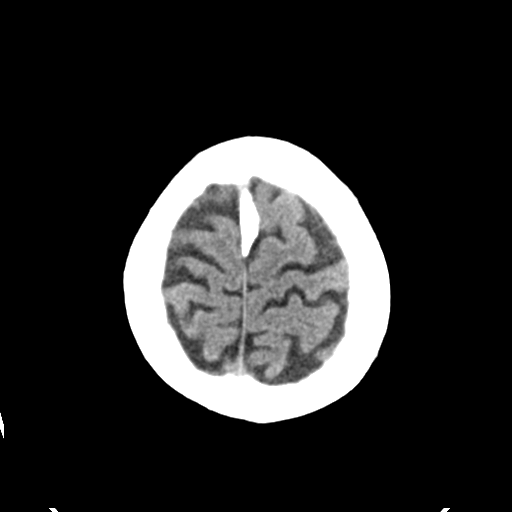
[im 27/33  brain]
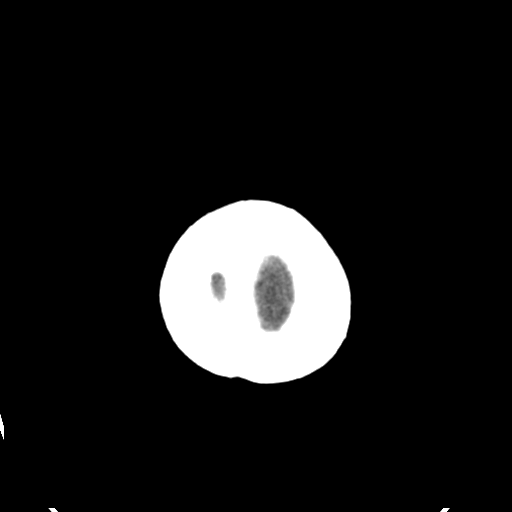
[im 30/33  brain]
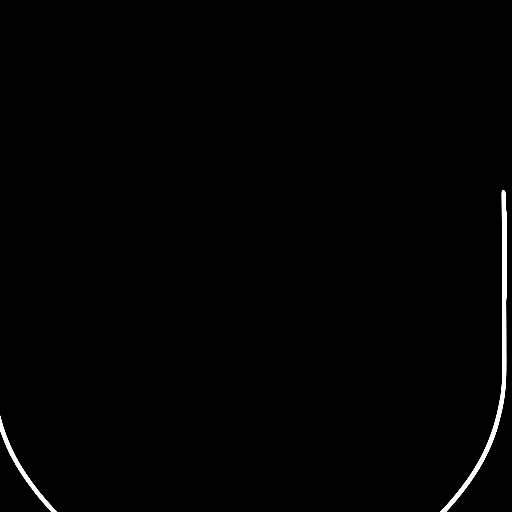
[im 30/33  bone]
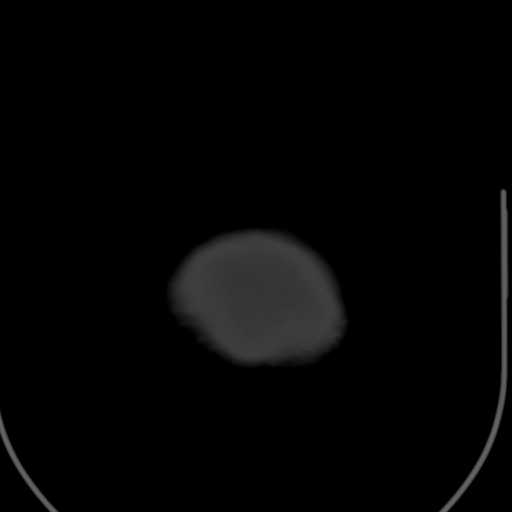

[Series 4: head 3.0 mpr cor · coronal · 0.32mm/px · 3 of 74 slices shown]
[im 25/74  brain]
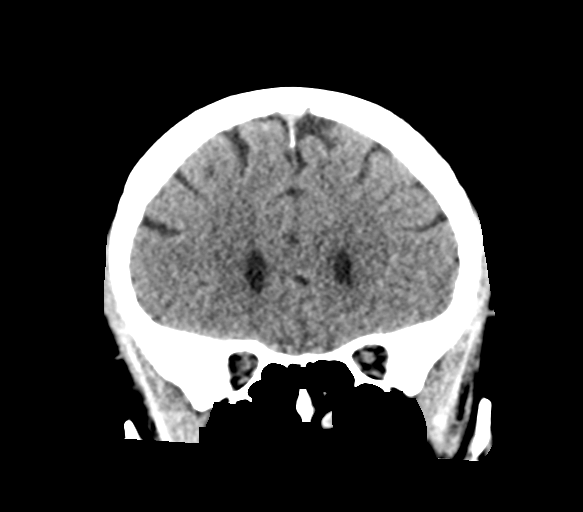
[im 33/74  brain]
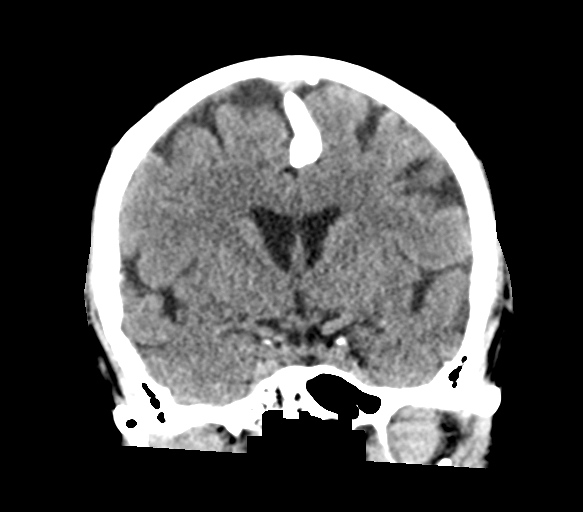
[im 41/74  brain]
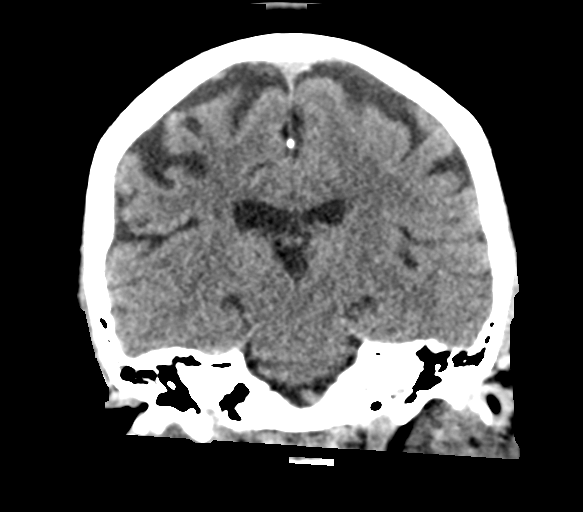

[Series 5: head 3.0 mpr sag · sagittal · 0.32mm/px · 3 of 67 slices shown]
[im 23/67  brain]
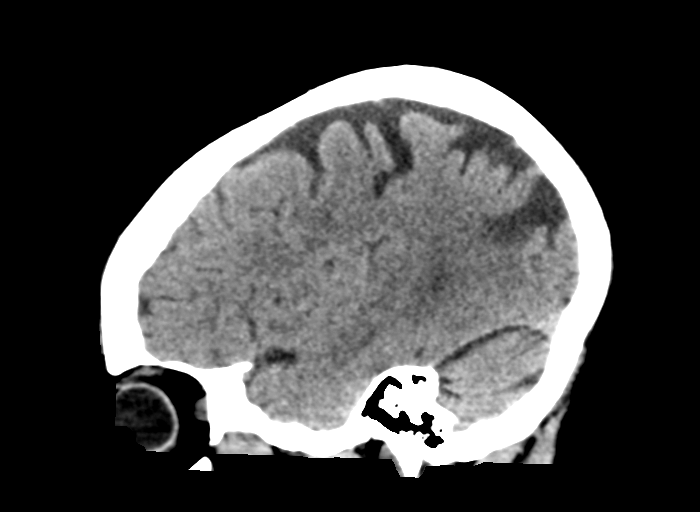
[im 34/67  brain]
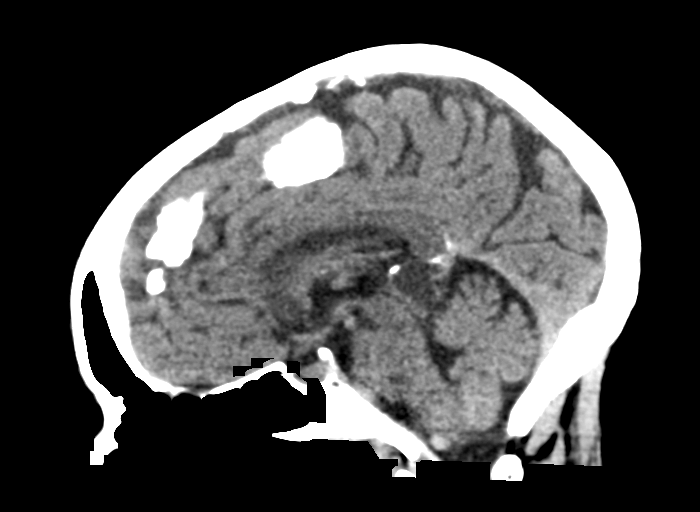
[im 45/67  brain]
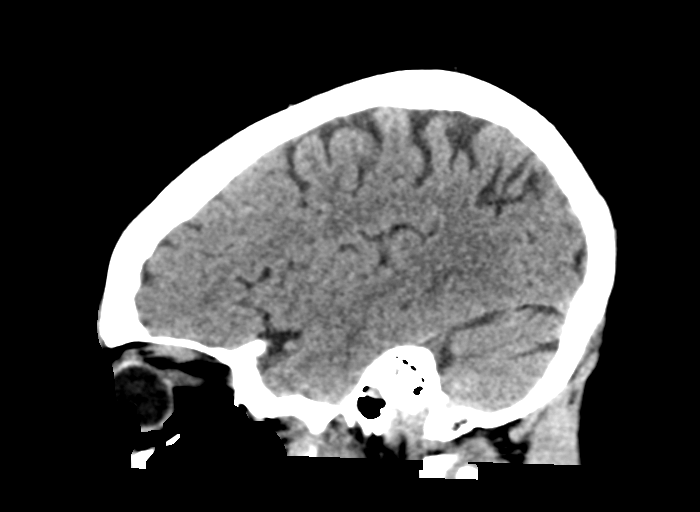

[15 of 47 positions shown; findings below may reference images not displayed]

FINDINGS: Brain: Old right posterior parietal infarct, stable. No acute
intracranial abnormality. Specifically, no hemorrhage,
hydrocephalus, mass lesion, acute infarction, or significant
intracranial injury.

Vascular: No hyperdense vessel or unexpected calcification.

Skull: No acute calvarial abnormality.

Sinuses/Orbits: Visualized paranasal sinuses and mastoids clear.
Orbital soft tissues unremarkable.

Other: None
IMPRESSION: No acute intracranial abnormality.

## 2020-09-09 ENCOUNTER — Ambulatory Visit (HOSPITAL_COMMUNITY)
Admission: EM | Admit: 2020-09-09 | Discharge: 2020-09-09 | Disposition: A | Discharge status: Home / Self Care | Payer: 59 | Attending: Family Medicine | Admitting: Family Medicine

## 2020-09-09 ENCOUNTER — Other Ambulatory Visit: Payer: Self-pay

## 2020-09-09 DIAGNOSIS — S161XXA Strain of muscle, fascia and tendon at neck level, initial encounter: Secondary | ICD-10-CM

## 2020-09-09 MED ORDER — LISINOPRIL 10 MG PO TABS
10.0000 mg | ORAL_TABLET | Freq: Every day | ORAL | 3 refills | Status: DC
Start: 2020-09-09 — End: 2020-10-04

## 2020-09-09 MED ORDER — CYCLOBENZAPRINE HCL 5 MG PO TABS
5.0000 mg | ORAL_TABLET | Freq: Three times a day (TID) | ORAL | 0 refills | Status: DC | PRN
Start: 2020-09-09 — End: 2020-12-08

## 2020-09-09 NOTE — Discharge Instructions (Addendum)
Flexeril as needed for muscle relaxant.  Heat and gentle stretching.  Refilled your blood pressure medicine.  Please follow up with a primary care doctor for blood pressure management.  I have given you contacts.

## 2020-09-09 NOTE — ED Triage Notes (Signed)
PT reports she woke up with RT neck pain . Pt also reports she needs a refill of HTN  meds.

## 2020-09-11 NOTE — ED Provider Notes (Signed)
[place]    CSN: [id number] Arrival date & time: 09/09/20  1116      History   Chief Complaint Chief Complaint  Patient presents with  . Neck Pain    RT    HPI Debbie Howard is a 59 y.o. female.   Pt is a 59 year old female that presents with right lateral neck pain.  Woke up this morning with the pain.  Pain worse with rotation of the neck.  Denies any falls or injuries.  Has not take any medicine for symptoms.  No swelling, fevers, ear pain or sore throat. She also wants a refill on her BP meds     Past Medical History:  Diagnosis Date  . CVA (cerebral infarction) 2009   Right frontal lobe precentral gyrus infarct  . Hypertension   . Meningioma ([place]) 2009    Probable right anterior frontal meningioma.   . Panic attacks   . SAB (spontaneous abortion)   . Stroke ([place])   . Transient ischemic attack 2009   x 3    Patient Active Problem List   Diagnosis Date Noted  . MENINGIOMA 03/14/2008  . CEREBROVASCULAR ACCIDENT 03/14/2008  . ABORTION, SPONTANEOUS 03/14/2008  . HYPERTENSION NEC 03/14/2008    Past Surgical History:  Procedure Laterality Date  . BREAST SURGERY      OB History   No obstetric history on file.      Home Medications    Prior to Admission medications   Medication Sig Start Date End Date Taking? Authorizing Provider  aspirin EC 325 MG tablet Take 325 mg by mouth 2 (two) times daily as needed for mild pain.    Yes [provider]  Multiple Vitamin (DAILY VITAMINS PO) Take by mouth.   Yes [provider]  amoxicillin-clavulanate (AUGMENTIN) 875-125 MG tablet Take 1 tablet by mouth every 12 (twelve) hours. 02/18/20   Vanessa Kick, MD  cyclobenzaprine (FLEXERIL) 5 MG tablet Take 1 tablet (5 mg total) by mouth 3 (three) times daily as needed for muscle spasms. 09/09/20   Loura Halt A, NP  diphenhydrAMINE (BENADRYL) 25 MG tablet Take 1 tablet (25 mg total) by mouth every 6 (six) hours as needed. 04/23/20    Isla Pence, MD  diphenhydrAMINE-zinc acetate (BENADRYL) cream Apply 1 application topically daily.    [provider]  hydrocortisone cream 1 % Apply 1 application topically daily.    [provider]  lisinopril (ZESTRIL) 10 MG tablet Take 1 tablet (10 mg total) by mouth daily. 09/09/20   Loura Halt A, NP  sulfamethoxazole-trimethoprim (BACTRIM) 400-80 MG tablet Take 1 tablet by mouth 2 (two) times daily.    [provider]  FLUoxetine (PROZAC) 10 MG tablet Take 1 tablet (10 mg total) by mouth daily. 01/09/19 11/11/19  Robyn Haber, MD    Family History Family History  Problem Relation Age of Onset  . Stroke Mother   . Hypertension Mother   . Diabetes Brother   . Stroke Brother   . Heart failure Father     Social History Social History   Tobacco Use  . Smoking status: Never Smoker  . Smokeless tobacco: Never Used  Vaping Use  . Vaping Use: Never used  Substance Use Topics  . Alcohol use: No  . Drug use: No     Allergies   Famotidine, Prednisone, and Iodine   Review of Systems Review of Systems   Physical Exam Triage Vital Signs ED Triage Vitals  Enc Vitals  Group     BP 09/09/20 1158 (!) 144/84     Pulse --      Resp 09/09/20 1158 16     Temp 09/09/20 1158 98.4 F (36.9 C)     Temp Source 09/09/20 1158 Oral     SpO2 09/09/20 1158 98 %     Weight 09/09/20 1202 154 lb (69.9 kg)     Height 09/09/20 1202 5\' 7"  (1.702 m)     Head Circumference --      Peak Flow --      Pain Score 09/09/20 1201 6     Pain Loc --      Pain Edu? --      Excl. in [employer]? --    No data found.  Updated Vital Signs BP (!) 144/84 (BP Location: Left Arm)   Temp 98.4 F (36.9 C) (Oral)   Resp 16   Ht 5\' 7"  (1.702 m)   Wt 154 lb (69.9 kg)   SpO2 98%   BMI 24.12 kg/m   Visual Acuity Right Eye Distance:   Left Eye Distance:   Bilateral Distance:    Right Eye Near:   Left Eye Near:    Bilateral Near:     Physical Exam Vitals and nursing  note reviewed.  Constitutional:      General: She is not in acute distress.    Appearance: Normal appearance. She is not ill-appearing, toxic-appearing or diaphoretic.  HENT:     Head: Normocephalic.     Nose: Nose normal.  Eyes:     Conjunctiva/sclera: Conjunctivae normal.  Neck:   Pulmonary:     Effort: Pulmonary effort is normal.     Breath sounds: Normal breath sounds.  Musculoskeletal:     Cervical back: Pain with movement and muscular tenderness present. No spinous process tenderness. Decreased range of motion.  Skin:    General: Skin is warm and dry.     Findings: No rash.  Neurological:     Mental Status: She is alert.  Psychiatric:        Mood and Affect: Mood normal.      UC Treatments / Results  Labs (all labs ordered are listed, but only abnormal results are displayed) Labs Reviewed - No data to display  EKG   Radiology No results found.  Procedures Procedures (including critical care time)  Medications Ordered in UC Medications - No data to display  Initial Impression / Assessment and Plan / UC Course  I have reviewed the triage vital signs and the nursing notes.  Pertinent labs & imaging results that were available during my care of the patient were reviewed by me and considered in my medical decision making (see chart for details).     Strain of neck muscle  Flexeril to treat  Heat and gentle stretching.    Essential hypertension Refilled medicine as prescribed  Final Clinical Impressions(s) / UC Diagnoses   Final diagnoses:  Strain of neck muscle, initial encounter     Discharge Instructions     Flexeril as needed for muscle relaxant.  Heat and gentle stretching.  Refilled your blood pressure medicine.  Please follow up with a primary care doctor for blood pressure management.  I have given you contacts.      ED Prescriptions    Medication Sig Dispense Auth. Provider   cyclobenzaprine (FLEXERIL) 5 MG tablet Take 1 tablet (5  mg total) by mouth 3 (three) times daily as needed for muscle spasms. 30 tablet  Henning Ehle A, NP   lisinopril (ZESTRIL) 10 MG tablet Take 1 tablet (10 mg total) by mouth daily. 30 tablet Loura Halt A, NP     PDMP not reviewed this encounter.   Orvan July, NP 09/11/20 1049

## 2020-10-04 ENCOUNTER — Other Ambulatory Visit: Payer: Self-pay

## 2020-10-04 ENCOUNTER — Ambulatory Visit (HOSPITAL_COMMUNITY)
Admission: EM | Admit: 2020-10-04 | Discharge: 2020-10-04 | Disposition: A | Discharge status: Home / Self Care | Payer: 59

## 2020-10-04 ENCOUNTER — Encounter (HOSPITAL_COMMUNITY): Payer: Self-pay

## 2020-10-04 DIAGNOSIS — I10 Essential (primary) hypertension: Secondary | ICD-10-CM | POA: Diagnosis not present

## 2020-10-04 DIAGNOSIS — K0889 Other specified disorders of teeth and supporting structures: Secondary | ICD-10-CM

## 2020-10-04 MED ORDER — LIDOCAINE VISCOUS HCL 2 % MT SOLN
10.0000 mL | OROMUCOSAL | 0 refills | Status: DC | PRN
Start: 2020-10-04 — End: 2020-11-20

## 2020-10-04 MED ORDER — LISINOPRIL 10 MG PO TABS
10.0000 mg | ORAL_TABLET | Freq: Every day | ORAL | 2 refills | Status: DC
Start: 2020-10-04 — End: 2020-11-08

## 2020-10-04 MED ORDER — AMOXICILLIN-POT CLAVULANATE 875-125 MG PO TABS
1.0000 | ORAL_TABLET | Freq: Two times a day (BID) | ORAL | 0 refills | Status: DC
Start: 2020-10-04 — End: 2020-11-08

## 2020-10-04 NOTE — ED Triage Notes (Signed)
Pt c/o dental pain upper right area for approx 2 days.  Pt also requests refill of HTN medication (lisinopril). States took last pill yesterday.  Denies fever, drainage in mouth.

## 2020-10-05 NOTE — ED Provider Notes (Signed)
[place]    CSN: [id number] Arrival date & time: 10/04/20  1749      History   Chief Complaint Chief Complaint  Patient presents with  . Dental Pain    HPI MAHUM BETTEN is a 59 y.o. female.   Pt c/o dental pain upper right area for approx 2 days.  Pt also requests refill of HTN medication (lisinopril). States took last pill yesterday.  Denies fever, drainage in mouth.  Here today with 2 day history of right upper jaw dental pain, swelling of right side of face. Denies fever, chills, drainage in mouth, sweats, body aches. Using salt water gargles and OTC pain relievers without benefit. Has had issues in this area in the past, awaiting insurance to kick in so she can return to dentist.   Also needing lisinopril refill. Takes consistently without side effects. Denies CP, SOB, HAs, dizziness. Home BPs running around 120s-130s/80s.      Past Medical History:  Diagnosis Date  . CVA (cerebral infarction) 2009   Right frontal lobe precentral gyrus infarct  . Hypertension   . Meningioma ([place]) 2009    Probable right anterior frontal meningioma.   . Panic attacks   . SAB (spontaneous abortion)   . Stroke ([place])   . Transient ischemic attack 2009   x 3    Patient Active Problem List   Diagnosis Date Noted  . MENINGIOMA 03/14/2008  . CEREBROVASCULAR ACCIDENT 03/14/2008  . ABORTION, SPONTANEOUS 03/14/2008  . HYPERTENSION NEC 03/14/2008    Past Surgical History:  Procedure Laterality Date  . BREAST SURGERY      OB History   No obstetric history on file.      Home Medications    Prior to Admission medications   Medication Sig Start Date End Date Taking? Authorizing Provider  aspirin EC 325 MG tablet Take 325 mg by mouth 2 (two) times daily as needed for mild pain.    Yes [provider]  Multiple Vitamin (MULTIVITAMIN) tablet Take 1 tablet by mouth daily.   Yes [provider]  amoxicillin-clavulanate (AUGMENTIN) 875-125 MG tablet  Take 1 tablet by mouth every 12 (twelve) hours. 10/04/20   Volney American, PA-C  cyclobenzaprine (FLEXERIL) 5 MG tablet Take 1 tablet (5 mg total) by mouth 3 (three) times daily as needed for muscle spasms. 09/09/20   Loura Halt A, NP  diphenhydrAMINE (BENADRYL) 25 MG tablet Take 1 tablet (25 mg total) by mouth every 6 (six) hours as needed. 04/23/20   Isla Pence, MD  diphenhydrAMINE-zinc acetate (BENADRYL) cream Apply 1 application topically daily.    [provider]  hydrocortisone cream 1 % Apply 1 application topically daily.    [provider]  lidocaine (XYLOCAINE) 2 % solution Use as directed 10 mLs in the mouth or throat as needed for mouth pain. 10/04/20   Volney American, PA-C  lisinopril (ZESTRIL) 10 MG tablet Take 1 tablet (10 mg total) by mouth daily. 10/04/20   Volney American, PA-C  Multiple Vitamin (DAILY VITAMINS PO) Take by mouth.    [provider]  sulfamethoxazole-trimethoprim (BACTRIM) 400-80 MG tablet Take 1 tablet by mouth 2 (two) times daily.    [provider]  FLUoxetine (PROZAC) 10 MG tablet Take 1 tablet (10 mg total) by mouth daily. 01/09/19 11/11/19  Robyn Haber, MD    Family History Family History  Problem Relation Age of Onset  . Stroke Mother   . Hypertension Mother   . Diabetes  Brother   . Stroke Brother   . Heart failure Father     Social History Social History   Tobacco Use  . Smoking status: Never Smoker  . Smokeless tobacco: Never Used  Vaping Use  . Vaping Use: Never used  Substance Use Topics  . Alcohol use: No  . Drug use: No     Allergies   Famotidine, Prednisone, Shellfish allergy, and Iodine   Review of Systems Review of Systems PER HPI   Physical Exam Triage Vital Signs ED Triage Vitals  Enc Vitals Group     BP 10/04/20 1840 132/79     Pulse Rate 10/04/20 1840 71     Resp 10/04/20 1840 18     Temp 10/04/20 1840 97.6 F (36.4 C)     Temp Source 10/04/20  1840 Oral     SpO2 10/04/20 1840 98 %     Weight --      Height --      Head Circumference --      Peak Flow --      Pain Score 10/04/20 1842 8     Pain Loc --      Pain Edu? --      Excl. in [place]? --    No data found.  Updated Vital Signs BP 132/79 (BP Location: Right Arm)   Pulse 71   Temp 97.6 F (36.4 C) (Oral)   Resp 18   SpO2 98%   Visual Acuity Right Eye Distance:   Left Eye Distance:   Bilateral Distance:    Right Eye Near:   Left Eye Near:    Bilateral Near:     Physical Exam Vitals and nursing note reviewed.  Constitutional:      Appearance: Normal appearance. She is not ill-appearing.  HENT:     Head: Atraumatic.     Mouth/Throat:     Mouth: Mucous membranes are moist.     Pharynx: Posterior oropharyngeal erythema (right upper posterior molar with decay, gingival erythema and edema, no active drainage) present.  Eyes:     Extraocular Movements: Extraocular movements intact.     Conjunctiva/sclera: Conjunctivae normal.  Cardiovascular:     Rate and Rhythm: Normal rate and regular rhythm.     Heart sounds: Normal heart sounds.  Pulmonary:     Effort: Pulmonary effort is normal.     Breath sounds: Normal breath sounds.  Abdominal:     General: Bowel sounds are normal. There is no distension.     Palpations: Abdomen is soft.     Tenderness: There is no abdominal tenderness.  Musculoskeletal:        General: Normal range of motion.     Cervical back: Normal range of motion and neck supple.  Skin:    General: Skin is warm and dry.  Neurological:     Mental Status: She is alert and oriented to person, place, and time.  Psychiatric:        Mood and Affect: Mood normal.        Thought Content: Thought content normal.        Judgment: Judgment normal.      UC Treatments / Results  Labs (all labs ordered are listed, but only abnormal results are displayed) Labs Reviewed - No data to display  EKG   Radiology No results  found.  Procedures Procedures (including critical care time)  Medications Ordered in UC Medications - No data to display  Initial Impression / Assessment and Plan /  UC Course  I have reviewed the triage vital signs and the nursing notes.  Pertinent labs & imaging results that were available during my care of the patient were reviewed by me and considered in my medical decision making (see chart for details).     Tx with augmentin, viscous lidocaine, otc pain relievers, dental f/u. Will refill lisinopril, BP WNL today and well tolerated medicine. Last labs less than 6 months ago WNL. F/u with PCP.   Final Clinical Impressions(s) / UC Diagnoses   Final diagnoses:  Pain, dental  Essential hypertension   Discharge Instructions   None    ED Prescriptions    Medication Sig Dispense Auth. Provider   amoxicillin-clavulanate (AUGMENTIN) 875-125 MG tablet Take 1 tablet by mouth every 12 (twelve) hours. 14 tablet Volney American, [place]   lisinopril (ZESTRIL) 10 MG tablet Take 1 tablet (10 mg total) by mouth daily. 30 tablet Volney American, [place]   lidocaine (XYLOCAINE) 2 % solution Use as directed 10 mLs in the mouth or throat as needed for mouth pain. 100 mL Volney American, [place]     PDMP not reviewed this encounter.   Volney American, [place] 10/05/20 [phone]

## 2020-10-28 ENCOUNTER — Encounter (HOSPITAL_COMMUNITY): Payer: Self-pay

## 2020-10-28 ENCOUNTER — Other Ambulatory Visit: Payer: Self-pay

## 2020-10-28 ENCOUNTER — Ambulatory Visit (HOSPITAL_COMMUNITY)
Admission: EM | Admit: 2020-10-28 | Discharge: 2020-10-28 | Disposition: A | Discharge status: Home / Self Care | Payer: HRSA Program | Attending: Internal Medicine | Admitting: Internal Medicine

## 2020-10-28 DIAGNOSIS — Z20822 Contact with and (suspected) exposure to covid-19: Secondary | ICD-10-CM | POA: Diagnosis not present

## 2020-10-28 NOTE — ED Triage Notes (Signed)
Pt presents for COVID testing. Pt denies fever, shortness of breath, cough, or any other symptoms.    

## 2020-10-29 LAB — SARS CORONAVIRUS 2 (TAT 6-24 HRS): SARS Coronavirus 2: POSITIVE — AB

## 2020-11-04 ENCOUNTER — Other Ambulatory Visit (INDEPENDENT_AMBULATORY_CARE_PROVIDER_SITE_OTHER): Payer: Self-pay

## 2020-11-08 ENCOUNTER — Encounter (HOSPITAL_COMMUNITY): Payer: Self-pay

## 2020-11-08 ENCOUNTER — Ambulatory Visit (HOSPITAL_COMMUNITY)
Admission: EM | Admit: 2020-11-08 | Discharge: 2020-11-08 | Disposition: A | Discharge status: Home / Self Care | Payer: Self-pay | Attending: Internal Medicine | Admitting: Internal Medicine

## 2020-11-08 DIAGNOSIS — K0889 Other specified disorders of teeth and supporting structures: Secondary | ICD-10-CM

## 2020-11-08 DIAGNOSIS — R195 Other fecal abnormalities: Secondary | ICD-10-CM

## 2020-11-08 MED ORDER — LISINOPRIL 10 MG PO TABS
10.0000 mg | ORAL_TABLET | Freq: Every day | ORAL | 1 refills | Status: DC
Start: 2020-11-08 — End: 2021-01-14

## 2020-11-08 MED ORDER — AMOXICILLIN-POT CLAVULANATE 875-125 MG PO TABS
1.0000 | ORAL_TABLET | Freq: Two times a day (BID) | ORAL | 0 refills | Status: AC
Start: 2020-11-08 — End: 2020-11-13

## 2020-11-08 NOTE — Discharge Instructions (Signed)
Continue current symptoms are likely related to recent COVID infection.  Make sure you are eating and drinking well.  Take Flonase 1 spray each nostril daily to help clear congestion.  You can also take cetirizine 10 mg daily for same.  Tylenol or Motrin for headache.  I will give you a prescription for antibiotics to use should your dental pain worsen.  However, as we discussed you will need to have this tooth pulled frequent antibiotic treatment can have consequences.  Please return for worsening symptoms.

## 2020-11-08 NOTE — ED Provider Notes (Signed)
[place]    CSN: [id number] Arrival date & time: 11/08/20  1115      History   Chief Complaint Chief Complaint  Patient presents with   Diarrhea   Headache   Sore Throat   Nasal Congestion    HPI Debbie Howard is a 60 y.o. female with multiple medical problems presents urgent care today feeling unwell since recent COVID diagnosis.  Patient also requesting copy of positive COVID test for work.  Patient states she was diagnosed with COVID on 1/1 and still with continued headache, congestion and poor appetite.  Patient states she had 1 episode of loose stool yesterday.  She denies any recent fever or chills, chest pain, shortness of breath, abdominal pain, nausea or vomiting, no melena or hematochezia.  Patient also with sore tooth intermittently.  States tooth is not hurting today they are requesting an antibiotic for when it does start hurting.  Also requesting blood pressure medication refill.   Past Medical History:  Diagnosis Date   CVA (cerebral infarction) 2009   Right frontal lobe precentral gyrus infarct   Hypertension    Meningioma ([place]) 2009    Probable right anterior frontal meningioma.    Panic attacks    SAB (spontaneous abortion)    Stroke ([place])    Transient ischemic attack 2009   x 3    Patient Active Problem List   Diagnosis Date Noted   MENINGIOMA 03/14/2008   CEREBROVASCULAR ACCIDENT 03/14/2008   ABORTION, SPONTANEOUS 03/14/2008   HYPERTENSION NEC 03/14/2008    Past Surgical History:  Procedure Laterality Date   BREAST SURGERY      OB History   No obstetric history on file.      Home Medications    Prior to Admission medications   Medication Sig Start Date End Date Taking? Authorizing Provider  amoxicillin-clavulanate (AUGMENTIN) 875-125 MG tablet Take 1 tablet by mouth every 12 (twelve) hours for 5 days. 11/08/20 11/13/20  Rudolpho Sevin, NP  aspirin EC 325 MG tablet Take 325 mg by mouth 2 (two) times daily as  needed for mild pain.     [provider]  cyclobenzaprine (FLEXERIL) 5 MG tablet Take 1 tablet (5 mg total) by mouth 3 (three) times daily as needed for muscle spasms. 09/09/20   Loura Halt A, NP  diphenhydrAMINE (BENADRYL) 25 MG tablet Take 1 tablet (25 mg total) by mouth every 6 (six) hours as needed. 04/23/20   Isla Pence, MD  diphenhydrAMINE-zinc acetate (BENADRYL) cream Apply 1 application topically daily.    [provider]  fluticasone (FLONASE) 50 MCG/ACT nasal spray Place 1 spray into both nostrils daily. 11/09/20   Rudolpho Sevin, NP  hydrocortisone cream 1 % Apply 1 application topically daily.    [provider]  lidocaine (XYLOCAINE) 2 % solution Use as directed 10 mLs in the mouth or throat as needed for mouth pain. 10/04/20   Volney American, PA-C  lisinopril (ZESTRIL) 10 MG tablet Take 1 tablet (10 mg total) by mouth daily. 11/08/20   Rudolpho Sevin, NP  Multiple Vitamin (DAILY VITAMINS PO) Take by mouth.    [provider]  Multiple Vitamin (MULTIVITAMIN) tablet Take 1 tablet by mouth daily.    [provider]  sulfamethoxazole-trimethoprim (BACTRIM) 400-80 MG tablet Take 1 tablet by mouth 2 (two) times daily.    [provider]  FLUoxetine (PROZAC) 10 MG tablet Take 1 tablet (10 mg total) by mouth daily. 01/09/19 11/11/19  Lauenstein,  Synetta Shadow, MD    Family History Family History  Problem Relation Age of Onset   Stroke Mother    Hypertension Mother    Diabetes Brother    Stroke Brother    Heart failure Father     Social History Social History   Tobacco Use   Smoking status: Never Smoker   Smokeless tobacco: Never Used  Scientific laboratory technician Use: Never used  Substance Use Topics   Alcohol use: No   Drug use: No     Allergies   Famotidine, Prednisone, Shellfish allergy, and Iodine   Review of Systems As stated in HPI otherwise negative   Physical Exam Triage Vital Signs ED Triage Vitals   Enc Vitals Group     BP 11/08/20 1216 (!) 120/97     Pulse Rate 11/08/20 1216 78     Resp 11/08/20 1216 20     Temp 11/08/20 1216 97.6 F (36.4 C)     Temp Source 11/08/20 1216 Temporal     SpO2 11/08/20 1216 98 %     Weight --      Height --      Head Circumference --      Peak Flow --      Pain Score 11/08/20 1212 7     Pain Loc --      Pain Edu? --      Excl. in [place]? --    No data found.  Updated Vital Signs BP (!) 120/97    Pulse 78    Temp 97.6 F (36.4 C) (Temporal)    Resp 20    SpO2 98%   Visual Acuity Right Eye Distance:   Left Eye Distance:   Bilateral Distance:    Right Eye Near:   Left Eye Near:    Bilateral Near:     Physical Exam Constitutional:      General: She is not in acute distress.    Appearance: She is well-developed and normal weight. She is not ill-appearing or toxic-appearing.  HENT:     Mouth/Throat:     Mouth: Mucous membranes are moist.     Pharynx: Oropharynx is clear.  Eyes:     General: No scleral icterus.    Extraocular Movements: Extraocular movements intact.  Cardiovascular:     Rate and Rhythm: Normal rate and regular rhythm.     Heart sounds: Normal heart sounds.  Pulmonary:     Effort: Pulmonary effort is normal.     Breath sounds: Normal breath sounds. No wheezing, rhonchi or rales.  Abdominal:     General: Bowel sounds are normal. There is no distension.     Palpations: Abdomen is soft.     Tenderness: There is no abdominal tenderness.  Musculoskeletal:        General: Normal range of motion.     Cervical back: Normal range of motion and neck supple.  Lymphadenopathy:     Cervical: No cervical adenopathy.  Skin:    General: Skin is warm and dry.  Neurological:     Mental Status: She is alert.  Psychiatric:        Mood and Affect: Mood normal.        Behavior: Behavior normal.     UC Treatments / Results  Labs (all labs ordered are listed, but only abnormal results are displayed) Labs Reviewed - No data to  display  EKG   Radiology No results found.  Procedures Procedures (including critical care time)  Medications  Ordered in UC Medications - No data to display  Initial Impression / Assessment and Plan / UC Course  I have reviewed the triage vital signs and the nursing notes.  Pertinent labs & imaging results that were available during my care of the patient were reviewed by me and considered in my medical decision making (see chart for details).  Headache Loose stool Dental pain VSS, nontoxic appearing with no evidence of dehydration.  Loose stool only x1 with no abdominal pain.  Symptoms felt related to recent COVID diagnosis.  Supportive care discussed and follow-up precautions.  In regards to patient's dental pain, discussed the importance of avoiding antibiotic treatment unless absolutely necessary but this can lead to antibiotic resistance and/or C. difficile.  Patient does have several teeth that need dental attention and are prone to infection.  Dental referral given.  Reviewed expections re: course of current medical issues. Questions answered. Outlined signs and symptoms indicating need for more acute intervention. Pt verbalized understanding. AVS given  Final Clinical Impressions(s) / UC Diagnoses   Final diagnoses:  Loose stools  Pain, dental     Discharge Instructions     Continue current symptoms are likely related to recent COVID infection.  Make sure you are eating and drinking well.  Take Flonase 1 spray each nostril daily to help clear congestion.  You can also take cetirizine 10 mg daily for same.  Tylenol or Motrin for headache.  I will give you a prescription for antibiotics to use should your dental pain worsen.  However, as we discussed you will need to have this tooth pulled frequent antibiotic treatment can have consequences.  Please return for worsening symptoms.    ED Prescriptions    Medication Sig Dispense Auth. Provider    amoxicillin-clavulanate (AUGMENTIN) 875-125 MG tablet Take 1 tablet by mouth every 12 (twelve) hours for 5 days. 14 tablet Rudolpho Sevin, NP   lisinopril (ZESTRIL) 10 MG tablet Take 1 tablet (10 mg total) by mouth daily. 30 tablet Rudolpho Sevin, NP     PDMP not reviewed this encounter.   Rudolpho Sevin, NP 11/09/20 1359

## 2020-11-08 NOTE — ED Triage Notes (Signed)
Pt in with c/o headache, ST, congestion and diarrhea that started yesterday. States she was dx with covid 1 week ago but her sxs are not improving.  Also requesting antibiotic for tooth pain and refill on her BP medication

## 2020-11-09 ENCOUNTER — Telehealth (HOSPITAL_COMMUNITY): Payer: Self-pay | Admitting: Internal Medicine

## 2020-11-09 MED ORDER — FLUTICASONE PROPIONATE 50 MCG/ACT NA SUSP
1.0000 | Freq: Every day | NASAL | 2 refills | Status: DC
Start: 2020-11-09 — End: 2021-03-08

## 2020-11-09 NOTE — Telephone Encounter (Signed)
Patient called urgent care request prescription for fluticasone.  Patient seen and examined on 1/12.  Order placed  Alan Ripper, NP [place]

## 2020-11-20 ENCOUNTER — Encounter (HOSPITAL_COMMUNITY): Payer: Self-pay

## 2020-11-20 ENCOUNTER — Other Ambulatory Visit: Payer: Self-pay

## 2020-11-20 ENCOUNTER — Ambulatory Visit (HOSPITAL_COMMUNITY)
Admission: EM | Admit: 2020-11-20 | Discharge: 2020-11-20 | Disposition: A | Discharge status: Home / Self Care | Payer: 59 | Attending: Emergency Medicine | Admitting: Emergency Medicine

## 2020-11-20 DIAGNOSIS — R110 Nausea: Secondary | ICD-10-CM

## 2020-11-20 DIAGNOSIS — R519 Headache, unspecified: Secondary | ICD-10-CM

## 2020-11-20 DIAGNOSIS — U071 COVID-19: Secondary | ICD-10-CM | POA: Diagnosis not present

## 2020-11-20 MED ORDER — ONDANSETRON HCL 4 MG PO TABS
4.0000 mg | ORAL_TABLET | Freq: Four times a day (QID) | ORAL | 0 refills | Status: DC | PRN
Start: 2020-11-20 — End: 2020-12-08

## 2020-11-20 NOTE — ED Triage Notes (Signed)
Patient states she has been experiencing diarrhea, nause, and a moderate intermittent headache since yesterday. Pt is aox4 and ambulatory.

## 2020-11-20 NOTE — ED Provider Notes (Signed)
[name]    CSN: [id number] Arrival date & time: 11/20/20  8101      History   Chief Complaint Chief Complaint  Patient presents with  . Abdominal Pain    Cramps since last night  . Diarrhea    Since last night  . Headache    Since last night    HPI Debbie Howard is a 60 y.o. female.   Patient presents with 3 to 4-week history of intermittent headache, nausea, diarrhea.  Last episode of diarrhea 1 week ago.  She was seen here on 10/28/2020 and tested positive for COVID.  She was seen here again on 11/08/2020; diagnosed with loose stools and dental pain; treated with Augmentin; also given refill on lisinopril.  Patient denies fever, chills, cough, shortness of breath, abdominal pain, dysuria, back pain, or other symptoms.  Her medical history includes CVA, hypertension, meningioma, panic attacks.  The history is provided by the patient and medical records.    Past Medical History:  Diagnosis Date  . CVA (cerebral infarction) 2009   Right frontal lobe precentral gyrus infarct  . Hypertension   . Meningioma ([place]) 2009    Probable right anterior frontal meningioma.   . Panic attacks   . SAB (spontaneous abortion)   . Stroke ([place])   . Transient ischemic attack 2009   x 3    Patient Active Problem List   Diagnosis Date Noted  . MENINGIOMA 03/14/2008  . CEREBROVASCULAR ACCIDENT 03/14/2008  . ABORTION, SPONTANEOUS 03/14/2008  . HYPERTENSION NEC 03/14/2008    Past Surgical History:  Procedure Laterality Date  . BREAST SURGERY      OB History   No obstetric history on file.      Home Medications    Prior to Admission medications   Medication Sig Start Date End Date Taking? Authorizing Provider  aspirin EC 325 MG tablet Take 325 mg by mouth 2 (two) times daily as needed for mild pain.    Yes [provider]  cyclobenzaprine (FLEXERIL) 5 MG tablet Take 1 tablet (5 mg total) by mouth 3 (three) times daily as needed for muscle spasms. 09/09/20   Yes Bast, Traci A, NP  lisinopril (ZESTRIL) 10 MG tablet Take 1 tablet (10 mg total) by mouth daily. 11/08/20  Yes Rudolpho Sevin, NP  Multiple Vitamin (DAILY VITAMINS PO) Take by mouth.   Yes [provider]  Multiple Vitamin (MULTIVITAMIN) tablet Take 1 tablet by mouth daily.   Yes [provider]  ondansetron (ZOFRAN) 4 MG tablet Take 1 tablet (4 mg total) by mouth every 6 (six) hours as needed for nausea or vomiting. 11/20/20  Yes Sharion Balloon, NP  diphenhydrAMINE-zinc acetate (BENADRYL) cream Apply 1 application topically daily.    [provider]  fluticasone (FLONASE) 50 MCG/ACT nasal spray Place 1 spray into both nostrils daily. 11/09/20   Rudolpho Sevin, NP  diphenhydrAMINE (BENADRYL) 25 MG tablet Take 1 tablet (25 mg total) by mouth every 6 (six) hours as needed. 04/23/20 11/20/20  Isla Pence, MD  FLUoxetine (PROZAC) 10 MG tablet Take 1 tablet (10 mg total) by mouth daily. 01/09/19 11/11/19  Robyn Haber, MD    Family History Family History  Problem Relation Age of Onset  . Stroke Mother   . Hypertension Mother   . Diabetes Brother   . Stroke Brother   . Heart failure Father     Social History Social History   Tobacco Use  . Smoking status: Never  Smoker  . Smokeless tobacco: Never Used  Vaping Use  . Vaping Use: Never used  Substance Use Topics  . Alcohol use: No  . Drug use: No     Allergies   Famotidine, Prednisone, Shellfish allergy, and Iodine   Review of Systems Review of Systems  Constitutional: Negative for chills and fever.  HENT: Negative for ear pain and sore throat.   Eyes: Negative for pain and visual disturbance.  Respiratory: Negative for cough and shortness of breath.   Cardiovascular: Negative for chest pain and palpitations.  Gastrointestinal: Positive for diarrhea and nausea. Negative for abdominal pain and vomiting.  Genitourinary: Negative for dysuria and hematuria.  Musculoskeletal: Negative for arthralgias  and back pain.  Skin: Negative for color change and rash.  Neurological: Positive for headaches. Negative for dizziness, syncope, weakness and numbness.  All other systems reviewed and are negative.    Physical Exam Triage Vital Signs ED Triage Vitals  Enc Vitals Group     BP      Pulse      Resp      Temp      Temp src      SpO2      Weight      Height      Head Circumference      Peak Flow      Pain Score      Pain Loc      Pain Edu?      Excl. in [place]?    No data found.  Updated Vital Signs BP (!) 160/82 (BP Location: Right Arm)   Pulse 65   Temp 98.3 F (36.8 C) (Oral)   Resp 18   SpO2 100%   Visual Acuity Right Eye Distance:   Left Eye Distance:   Bilateral Distance:    Right Eye Near:   Left Eye Near:    Bilateral Near:     Physical Exam Vitals and nursing note reviewed.  Constitutional:      General: She is not in acute distress.    Appearance: She is well-developed and well-nourished. She is not ill-appearing.  HENT:     Head: Normocephalic and atraumatic.     Mouth/Throat:     Mouth: Mucous membranes are moist.     Pharynx: Oropharynx is clear.  Eyes:     Conjunctiva/sclera: Conjunctivae normal.  Cardiovascular:     Rate and Rhythm: Normal rate and regular rhythm.     Heart sounds: Normal heart sounds.  Pulmonary:     Effort: Pulmonary effort is normal. No respiratory distress.     Breath sounds: Normal breath sounds.  Abdominal:     General: Bowel sounds are normal.     Palpations: Abdomen is soft.     Tenderness: There is no abdominal tenderness. There is no guarding or rebound.  Musculoskeletal:        General: No edema.     Cervical back: Neck supple.  Skin:    General: Skin is warm and dry.  Neurological:     General: No focal deficit present.     Mental Status: She is alert and oriented to person, place, and time.     Gait: Gait normal.  Psychiatric:        Mood and Affect: Mood and affect and mood normal.        Behavior:  Behavior normal.      UC Treatments / Results  Labs (all labs ordered are listed, but only abnormal results  are displayed) Labs Reviewed - No data to display  EKG   Radiology No results found.  Procedures Procedures (including critical care time)  Medications Ordered in UC Medications - No data to display  Initial Impression / Assessment and Plan / UC Course  I have reviewed the triage vital signs and the nursing notes.  Pertinent labs & imaging results that were available during my care of the patient were reviewed by me and considered in my medical decision making (see chart for details).   COVID-19, headache, nausea.  Treating nausea with Zofran.  Discussed CDC guidelines for retesting for COVID.  Instructed patient to stay hydrated with clear liquids.  Discussed other symptomatic treatments such as Tylenol as needed for headache.  Discussed with patient that her blood pressure is elevated today needs to be rechecked by her PCP in 2 to 4 weeks.  She agrees to plan of care.   Final Clinical Impressions(s) / UC Diagnoses   Final diagnoses:  COVID-19  Acute nonintractable headache, unspecified headache type  Nausea without vomiting     Discharge Instructions     Your COVID test was positive on 10/28/2020.  The CDC does not recommend retesting because you can test positive for up to 3 months.    Your blood pressure is elevated today at 160/82.  Please have this rechecked by your primary care provider in 2-4 weeks.         ED Prescriptions    Medication Sig Dispense Auth. Provider   ondansetron (ZOFRAN) 4 MG tablet Take 1 tablet (4 mg total) by mouth every 6 (six) hours as needed for nausea or vomiting. 12 tablet Sharion Balloon, NP     PDMP not reviewed this encounter.   Sharion Balloon, NP 11/20/20 1210

## 2020-11-20 NOTE — Discharge Instructions (Addendum)
Your COVID test was positive on 10/28/2020.  The CDC does not recommend retesting because you can test positive for up to 3 months.    Your blood pressure is elevated today at 160/82.  Please have this rechecked by your primary care provider in 2-4 weeks.

## 2020-12-08 ENCOUNTER — Ambulatory Visit (INDEPENDENT_AMBULATORY_CARE_PROVIDER_SITE_OTHER): Payer: 59 | Admitting: Family

## 2020-12-08 VITALS — BP 139/84 | HR 76 | Ht 64.8 in | Wt 155.2 lb

## 2020-12-08 DIAGNOSIS — Z7689 Persons encountering health services in other specified circumstances: Secondary | ICD-10-CM

## 2020-12-08 NOTE — Progress Notes (Signed)
Establish care Pt had [place] 1/22022, has been out of work since, would like to be released back to work Does not receive vaccines (flu, COVID, etc..

## 2020-12-08 NOTE — Patient Instructions (Signed)
Return for annual physical examination, labs, and health maintenance. Arrive fasting meaning having had no food and/or nothing to drink for at least 8 hours prior to appointment. Thank you for choosing Primary Care at [facility] for your medical home!    Debbie Howard was seen by Debbie Herter, NP today.   Debbie Howard's primary care provider is Debbie Fojtik Zachery Dauer, NP.   For the best care possible,  you should try to see Debbie Fruits, NP whenever you come to clinic.   We look forward to seeing you again soon!  If you have any questions about your visit today,  please call us at [phone]  Or feel free to reach your provider via [place].     Cooking With Less Salt Cooking with less salt is one way to reduce the amount of sodium you get from food. Sodium is one of the elements that make up salt. It is found naturally in foods and is also added to certain foods. Depending on your condition and overall health, your health care provider or dietitian may recommend that you reduce your sodium intake. Most people should have less than 2,300 milligrams (mg) of sodium each day. If you have high blood pressure (hypertension), you may need to limit your sodium to 1,500 mg each day. Follow the tips below to help reduce your sodium intake. What are tips for eating less sodium? Reading food labels  Check the food label before buying or using packaged ingredients. Always check the label for the serving size and sodium content.  Look for products with no more than 140 mg of sodium in one serving.  Check the % Daily Value column to see what percent of the daily recommended amount of sodium is provided in one serving of the product. Foods with 5% or less in this column are considered low in sodium. Foods with 20% or higher are considered high in sodium.  Do not choose foods with salt as one of the first three ingredients on the ingredients list. If salt is one of the first three ingredients, it  usually means the item is high in sodium.   Shopping  Buy sodium-free or low-sodium products. Look for the following words on food labels: ? Low-sodium. ? Sodium-free. ? Reduced-sodium. ? No salt added. ? Unsalted.  Always check the sodium content even if foods are labeled as low-sodium or no salt added.  Buy fresh foods. Cooking  Use herbs, seasonings without salt, and spices as substitutes for salt.  Use sodium-free baking soda when baking.  Grill, braise, or roast foods to add flavor with less salt.  Avoid adding salt to pasta, rice, or hot cereals.  Drain and rinse canned vegetables, beans, and meat before use.  Avoid adding salt when cooking sweets and desserts.  Cook with low-sodium ingredients. What foods are high in sodium? Vegetables Regular canned vegetables (not low-sodium or reduced-sodium). Sauerkraut, pickled vegetables, and relishes. Olives. [place] fries. Onion rings. Regular canned tomato sauce and paste. Regular tomato and vegetable juice. Frozen vegetables in sauces. Grains Instant hot cereals. Bread stuffing, pancake, and biscuit mixes. Croutons. Seasoned rice or pasta mixes. Noodle soup cups. Boxed or frozen macaroni and cheese. Regular salted crackers. Self-rising flour. Rolls. Bagels. Flour tortillas and wraps. Meats and other proteins Meat or fish that is salted, canned, smoked, cured, spiced, or pickled. This includes bacon, ham, sausages, hot dogs, corned beef, chipped beef, meat loaves, salt pork, jerky, pickled herring, anchovies, regular canned tuna,  and sardines. Salted nuts. Dairy Processed cheese and cheese spreads. Cheese curds. Blue cheese. Feta cheese. String cheese. Regular cottage cheese. Buttermilk. Canned milk. The items listed above may not be a complete list of foods high in sodium. Actual amounts of sodium may be different depending on processing. Contact a dietitian for more information. What foods are low in sodium? [place] Fresh,  frozen, or canned fruit with no sauce added. Fruit juice. Vegetables Fresh or frozen vegetables with no sauce added. "No salt added" canned vegetables. "No salt added" tomato sauce and paste. Low-sodium or reduced-sodium tomato and vegetable juice. Grains Noodles, pasta, quinoa, rice. Shredded or puffed wheat or puffed rice. Regular or quick oats (not instant). Low-sodium crackers. Low-sodium bread. Whole-grain bread and whole-grain pasta. Unsalted popcorn. Meats and other proteins Fresh or frozen whole meats, poultry (not injected with sodium), and fish with no sauce added. Unsalted nuts. Dried peas, beans, and lentils without added salt. Unsalted canned beans. Eggs. Unsalted nut butters. Low-sodium canned tuna or chicken. Dairy Milk. Soy milk. Yogurt. Low-sodium cheeses, such as Swiss, Monterey Jack, [place], and [employer]. Sherbet or ice cream (keep to  cup per serving). Cream cheese. Fats and oils Unsalted butter or margarine. Other foods Homemade pudding. Sodium-free baking soda and baking powder. Herbs and spices. Low-sodium seasoning mixes. Beverages Coffee and tea. Carbonated beverages. The items listed above may not be a complete list of foods low in sodium. Actual amounts of sodium may be different depending on processing. Contact a dietitian for more information. What are some salt alternatives when cooking? The following are herbs, seasonings, and spices that can be used instead of salt to flavor your food. Herbs should be fresh or dried. Do not choose packaged mixes. Next to the name of the herb, spice, or seasoning are some examples of foods you can pair it with. Herbs  Bay leaves - Soups, meat and vegetable dishes, and spaghetti sauce.  Basil - [employer], soups, pasta, and fish dishes.  Cilantro - Meat, poultry, and vegetable dishes.  Chili powder - Marinades and Mexican dishes.  Chives - Salad dressings and potato dishes.  Cumin - Mexican dishes, couscous, and  meat dishes.  Dill - Fish dishes, sauces, and salads.  Fennel - Meat and vegetable dishes, breads, and cookies.  Garlic (do not use garlic salt) - [place] dishes, meat dishes, salad dressings, and sauces.  Marjoram - Soups, potato dishes, and meat dishes.  Oregano - Pizza and spaghetti sauce.  Parsley - Salads, soups, pasta, and meat dishes.  Rosemary - [place] dishes, salad dressings, soups, and red meats.  Saffron - Fish dishes, pasta, and some poultry dishes.  Sage - Stuffings and sauces.  Tarragon - Fish and [employer].  Thyme - Stuffing, meat, and fish dishes. Seasonings  Lemon juice - Fish dishes, poultry dishes, vegetables, and salads.  Vinegar - Salad dressings, vegetables, and fish dishes. Spices  Cinnamon - Sweet dishes, such as cakes, cookies, and puddings.  Cloves - Gingerbread, puddings, and marinades for meats.  Curry - Vegetable dishes, fish and poultry dishes, and stir-fry dishes.  Ginger - Vegetable dishes, fish dishes, and stir-fry dishes.  Nutmeg - Pasta, vegetables, poultry, fish dishes, and custard. Summary  Cooking with less salt is one way to reduce the amount of sodium that you get from food.  Buy sodium-free or low-sodium products.  Check the food label before using or buying packaged ingredients.  Use herbs, seasonings without salt, and spices as substitutes for salt in foods. This  information is not intended to replace advice given to you by your health care provider. Make sure you discuss any questions you have with your health care provider. Document Revised: 10/06/2019 Document Reviewed: 10/06/2019 Elsevier Patient Education  [place].

## 2020-12-08 NOTE — Progress Notes (Signed)
Subjective:    Debbie Howard - 60 y.o. female MRN [id number]  Date of birth: [birth year]  HPI  Debbie Howard is to establish care. Patient has a PMH significant for cerebrovascular accident, meningioma, abortion spontaneous, and hypertension.   Current issues and/or concerns: States she needs a letter to return to work. 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 States she has been out of work since October 29, 2020 related to Covid. Her initial symptoms were sleeping a lot, coughing, chills, and fatigue. States when she attempted to return to work the following week she wasn't feeling well related to fibroids pain and was unable to complete her job to full capacity because of this. Says she was told to stay home until she is feeling better. States yesterday she called her job to let them know she is ready to return to work and was told she needs a Quarry manager from a provider to return to work.   Reports she is feeling better. Denies symptoms of Covid. States she wanted a repeat Covid test before she returned to work and that local medical centers would not give her one because she may still be positive for Covid at least 90 days following infection. Says last week she took a home Covid test and that she is negative. Says she is getting exercise by walking and eating healthier.  Cough: Yes []  No [x]  Shortness of breath: Yes []  No [x]  Fever: Yes []  No [x]  Runny nose: Yes []  No [x]  Nasal congestion: Yes []  No [x]  Decreased taste: Yes []  No [x]  Decreased smell: Yes []  No [x]  Decreased appetite: Yes []  No [x]  Trouble swallowing: Yes []  No [x]  Neck swelling/pain: Yes []  No [x]  Sore throat: Yes []  No [x]  Chest pain: Yes []  No [x]  Palpitations: Yes []   No [x]  Chills: Yes []  No [x]  Nausea: Yes []  No [x]  Vomiting: Yes []  No [x]  Abdominal pain: Yes []  No [x]  Body aches: Yes []  No [x]  Headaches: Yes []  No [x]  Dizziness: Yes []  No [x]  Lightheadedness: Yes []  No [x]  Confusion: Yes []  No [x]  Decreased  urine output: Yes []  No [x]  Decreased sleep quality: Yes []  No [x]  Over-the-counter medications: Yes []  No [x]  Limit ADLs: Yes []  No [x]  Employed: Yes [x]  No []   ROS per HPI   Health Maintenance:  Health Maintenance Due  Topic Date Due  . Hepatitis C Screening  Never done  . TETANUS/TDAP  Never done  . PAP SMEAR-Modifier  Never done  . COLONOSCOPY (Pts 45-34yrs Insurance coverage will need to be confirmed)  Never done  . MAMMOGRAM  Never done    Past Medical History: Patient Active Problem List   Diagnosis Date Noted  . MENINGIOMA 03/14/2008  . CEREBROVASCULAR ACCIDENT 03/14/2008  . ABORTION, SPONTANEOUS 03/14/2008  . HYPERTENSION NEC 03/14/2008   Social History   reports that she has never smoked. She has never used smokeless tobacco. She reports that she does not drink alcohol and does not use drugs.   Family History  family history includes Diabetes in her brother; Heart failure in her father; Hypertension in her mother; Stroke in her brother and mother.   Medications: reviewed and updated   Objective:   Physical Exam BP 139/84 (BP Location: Left Arm, Patient Position: Sitting)   Pulse 76   Ht 5' 4.8" (1.646 m)   Wt 155 lb 3.2 oz (70.4 kg)   SpO2 97%   BMI 25.98 kg/m  Physical Exam HENT:  Head: Normocephalic and atraumatic.  Eyes:     Extraocular Movements: Extraocular movements intact.     Pupils: Pupils are equal, round, and reactive to light.  Cardiovascular:     Rate and Rhythm: Normal rate and regular rhythm.     Pulses: Normal pulses.     Heart sounds: Normal heart sounds.  Pulmonary:     Effort: Pulmonary effort is normal.     Breath sounds: Normal breath sounds.  Musculoskeletal:        General: Normal range of motion.     Cervical back: Normal range of motion and neck supple.  Neurological:     General: No focal deficit present.     Mental Status: She is alert and oriented to person, place, and time.  Psychiatric:        Mood and Affect:  Mood normal.        Behavior: Behavior normal.      Assessment & Plan:  1. Encounter to establish care: - Patient presents today to establish care.  - Return for annual physical examination, labs, and health maintenance. Arrive fasting meaning having had no food and/or nothing to drink for at least 8 hours prior to appointment.  2. Return to work evaluation: - States she needs a Quarry manager to return to work. 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 States she has been out of work since October 29, 2020 related to Covid. Her initial symptoms were sleeping a lot, coughing, chills, and fatigue. States when she attempted to return to work the following week she wasn't feeling well related to fibroids pain and was unable to complete her job to full capacity because of this. Says she was told to stay home until she is feeling better. States yesterday she called her job to let them know she is ready to return to work and was told she needs a Quarry manager from a provider to return to work.  - Today reports she is feeling better. Denies symptoms of Covid. States she wanted a repeat Covid test before she returned to work and that local medical centers would not give her one because she may still be positive for Covid at least 90 days following infection. Says last week she took a home Covid test and that she is negative. Says she is getting exercise by walking and eating healthier. - Patient stable. Work note provided to return to work. Follow-up with primary provider as needed.    Patient was given clear instructions to go to Emergency Department or return to medical center if symptoms don't improve, worsen, or new problems develop.The patient verbalized understanding.  Durene Fruits, NP 12/10/2020, 9:25 AM Primary Care at [facility]

## 2021-01-14 ENCOUNTER — Other Ambulatory Visit: Payer: Self-pay

## 2021-01-14 ENCOUNTER — Encounter (HOSPITAL_COMMUNITY): Payer: Self-pay

## 2021-01-14 ENCOUNTER — Ambulatory Visit (HOSPITAL_COMMUNITY)
Admission: EM | Admit: 2021-01-14 | Discharge: 2021-01-14 | Disposition: A | Discharge status: Home / Self Care | Payer: 59 | Attending: Medical Oncology | Admitting: Medical Oncology

## 2021-01-14 DIAGNOSIS — T148XXA Other injury of unspecified body region, initial encounter: Secondary | ICD-10-CM | POA: Diagnosis not present

## 2021-01-14 DIAGNOSIS — I10 Essential (primary) hypertension: Secondary | ICD-10-CM

## 2021-01-14 MED ORDER — CYCLOBENZAPRINE HCL 5 MG PO TABS
5.0000 mg | ORAL_TABLET | Freq: Three times a day (TID) | ORAL | 0 refills | Status: DC | PRN
Start: 2021-01-14 — End: 2021-01-15

## 2021-01-14 MED ORDER — LISINOPRIL 10 MG PO TABS
10.0000 mg | ORAL_TABLET | Freq: Every day | ORAL | 0 refills | Status: DC
Start: 2021-01-14 — End: 2021-01-15

## 2021-01-14 NOTE — ED Triage Notes (Signed)
Pt presents for medication refill on blood pressure medication and muscle relaxers.

## 2021-01-14 NOTE — ED Provider Notes (Signed)
[place]    CSN: [id number] Arrival date & time: 01/14/21  1138      History   Chief Complaint Chief Complaint  Patient presents with  . Medication Refill    HPI Debbie Howard is a 61 y.o. female.   HPI  Essential hypertension: Patient presents to our office today for refill of her blood pressure medication.  She takes lisinopril 10 mg.  She states that she tolerates this well but has been out of this medication for 1 day.  She states that she is in process of switching her primary care provider as her previous primary care provider location has become slightly unsafe around the area.  She denies any chest pain, shortness of breath or peripheral edema.   Muscle pain: Patient states that she uses Flexeril as needed after working as she often has to lift items up at work and she can have some muscle soreness after.  No acute flares or any recent injury.  She denies any trouble with the Flexeril medication.   Past Medical History:  Diagnosis Date  . CVA (cerebral infarction) 2009   Right frontal lobe precentral gyrus infarct  . Hypertension   . 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   . Panic attacks   . SAB (spontaneous abortion)   . Stroke ([place])   . Transient ischemic attack 2009   x 3    Patient Active Problem List   Diagnosis Date Noted  . MENINGIOMA 03/14/2008  . CEREBROVASCULAR ACCIDENT 03/14/2008  . ABORTION, SPONTANEOUS 03/14/2008  . HYPERTENSION NEC 03/14/2008    Past Surgical History:  Procedure Laterality Date  . BREAST SURGERY      OB History   No obstetric history on file.      Home Medications    Prior to Admission medications   Medication Sig Start Date End Date Taking? Authorizing Provider  aspirin EC 325 MG tablet Take 325 mg by mouth 2 (two) times daily as needed for mild pain.     [provider]  fluticasone (FLONASE) 50 MCG/ACT nasal spray Place 1 spray into both nostrils  daily. Patient not taking: Reported on 12/08/2020 11/09/20   Rudolpho Sevin, NP  lisinopril (ZESTRIL) 10 MG tablet Take 1 tablet (10 mg total) by mouth daily. 11/08/20   Rudolpho Sevin, NP  Multiple Vitamin (MULTIVITAMIN) tablet Take 1 tablet by mouth daily.    [provider]  diphenhydrAMINE (BENADRYL) 25 MG tablet Take 1 tablet (25 mg total) by mouth every 6 (six) hours as needed. 04/23/20 11/20/20  Isla Pence, MD  FLUoxetine (PROZAC) 10 MG tablet Take 1 tablet (10 mg total) by mouth daily. 01/09/19 11/11/19  Robyn Haber, MD    Family History Family History  Problem Relation Age of Onset  . Stroke Mother   . Hypertension Mother   . Diabetes Brother   . Stroke Brother   . Heart failure Father     Social History Social History   Tobacco Use  . Smoking status: Never Smoker  . Smokeless tobacco: Never Used  Vaping Use  . Vaping Use: Never used  Substance Use Topics  . Alcohol use: No  . Drug use: No     Allergies   Fish allergy, Famotidine, Prednisone, Shellfish allergy, and Iodine   Review of Systems Review of Systems  As stated above in HPI Physical Exam Triage Vital Signs ED Triage Vitals  Enc Vitals Group  BP 01/14/21 1245 (!) 143/59     Pulse Rate 01/14/21 1245 83     Resp 01/14/21 1245 18     Temp 01/14/21 1245 98 F (36.7 C)     Temp Source 01/14/21 1245 Oral     SpO2 01/14/21 1245 100 %     Weight --      Height --      Head Circumference --      Peak Flow --      Pain Score 01/14/21 1241 0     Pain Loc --      Pain Edu? --      Excl. in [place]? --    No data found.  Updated Vital Signs BP (!) 143/59 (BP Location: Left Arm)   Pulse 83   Temp 98 F (36.7 C) (Oral)   Resp 18   SpO2 100%   Physical Exam Vitals and nursing note reviewed.  Constitutional:      General: She is not in acute distress.    Appearance: Normal appearance. She is not ill-appearing, toxic-appearing or diaphoretic.  HENT:     Mouth/Throat:     Mouth:  Mucous membranes are moist.  Eyes:     Extraocular Movements: Extraocular movements intact.     Pupils: Pupils are equal, round, and reactive to light.  Neck:     Vascular: No carotid bruit.  Cardiovascular:     Rate and Rhythm: Normal rate and regular rhythm.     Pulses: Normal pulses.     Heart sounds: Normal heart sounds.     Comments: No carotid bruits auscultated bilaterally  Pulmonary:     Effort: Pulmonary effort is normal.     Breath sounds: Normal breath sounds.  Musculoskeletal:        General: No tenderness. Normal range of motion.     Cervical back: Normal range of motion and neck supple.  Skin:    General: Skin is warm and dry.     Capillary Refill: Capillary refill takes less than 2 seconds.  Neurological:     Mental Status: She is alert.      UC Treatments / Results  Labs (all labs ordered are listed, but only abnormal results are displayed) Labs Reviewed - No data to display  EKG   Radiology No results found.  Procedures Procedures (including critical care time)  Medications Ordered in UC Medications - No data to display  Initial Impression / Assessment and Plan / UC Course  I have reviewed the triage vital signs and the nursing notes.  Pertinent labs & imaging results that were available during my care of the patient were reviewed by me and considered in my medical decision making (see chart for details).   New to me.  Refilling her medications for her.  Encouraged her to continue following up with her primary care provider or with our office as needed.  Final Clinical Impressions(s) / UC Diagnoses   Final diagnoses:  None   Discharge Instructions   None    ED Prescriptions    None     PDMP not reviewed this encounter.   Hughie Closs, [place] 01/14/21 1312

## 2021-01-15 ENCOUNTER — Telehealth (HOSPITAL_COMMUNITY): Payer: Self-pay | Admitting: Emergency Medicine

## 2021-01-15 MED ORDER — CYCLOBENZAPRINE HCL 5 MG PO TABS: 5.0000 mg | ORAL_TABLET | Freq: Three times a day (TID) | ORAL | 0 refills | Status: DC | PRN

## 2021-01-15 MED ORDER — LISINOPRIL 10 MG PO TABS: 10.0000 mg | ORAL_TABLET | Freq: Every day | ORAL | 0 refills | Status: DC

## 2021-01-15 NOTE — Telephone Encounter (Signed)
Patient wanted prescriptions resent to [facility]

## 2021-03-08 ENCOUNTER — Ambulatory Visit (HOSPITAL_COMMUNITY)
Admission: EM | Admit: 2021-03-08 | Discharge: 2021-03-08 | Disposition: A | Discharge status: Home / Self Care | Payer: 59 | Attending: Student | Admitting: Student

## 2021-03-08 ENCOUNTER — Encounter (HOSPITAL_COMMUNITY): Payer: Self-pay

## 2021-03-08 DIAGNOSIS — I10 Essential (primary) hypertension: Secondary | ICD-10-CM | POA: Diagnosis not present

## 2021-03-08 DIAGNOSIS — Z760 Encounter for issue of repeat prescription: Secondary | ICD-10-CM

## 2021-03-08 MED ORDER — LISINOPRIL 10 MG PO TABS
10.0000 mg | ORAL_TABLET | Freq: Every day | ORAL | 2 refills | Status: DC
Start: 2021-03-08 — End: 2021-07-10

## 2021-03-08 NOTE — ED Triage Notes (Signed)
Pt presents for medication refill:  -Lisinopril 10 mg.

## 2021-03-08 NOTE — ED Provider Notes (Signed)
[place]    CSN: [id number] Arrival date & time: 03/08/21  1702      History   Chief Complaint Chief Complaint  Patient presents with  . Medication Refill    HPI Debbie Howard is a 60 y.o. female presenting for medication refill.  Medical history hypertension, CVA, TIA.  States she has been on lisinopril 10 mg daily for years without issue.  Requires a refill on this as she is about to head out of town.  Denies chest pain, shortness of breath, headaches, weakness, dizziness.  HPI  Past Medical History:  Diagnosis Date  . CVA (cerebral infarction) 2009   Right frontal lobe precentral gyrus infarct  . Hypertension   . Meningioma ([place]) 2009    Probable right anterior frontal meningioma.   . Panic attacks   . SAB (spontaneous abortion)   . Stroke ([place])   . Transient ischemic attack 2009   x 3    Patient Active Problem List   Diagnosis Date Noted  . MENINGIOMA 03/14/2008  . CEREBROVASCULAR ACCIDENT 03/14/2008  . ABORTION, SPONTANEOUS 03/14/2008  . HYPERTENSION NEC 03/14/2008    Past Surgical History:  Procedure Laterality Date  . BREAST SURGERY      OB History   No obstetric history on file.      Home Medications    Prior to Admission medications   Medication Sig Start Date End Date Taking? Authorizing Provider  lisinopril (ZESTRIL) 10 MG tablet Take 1 tablet (10 mg total) by mouth daily. 03/08/21  Yes Hazel Sams, PA-C  aspirin EC 325 MG tablet Take 325 mg by mouth 2 (two) times daily as needed for mild pain.     [provider]  cyclobenzaprine (FLEXERIL) 5 MG tablet Take 1 tablet (5 mg total) by mouth 3 (three) times daily as needed for muscle spasms. 01/15/21   Chase Picket, MD  Multiple Vitamin (MULTIVITAMIN) tablet Take 1 tablet by mouth daily.    [provider]  diphenhydrAMINE (BENADRYL) 25 MG tablet Take 1 tablet (25 mg total) by mouth every 6 (six) hours as needed. 04/23/20 11/20/20  Isla Pence, MD   FLUoxetine (PROZAC) 10 MG tablet Take 1 tablet (10 mg total) by mouth daily. 01/09/19 11/11/19  Robyn Haber, MD  fluticasone (FLONASE) 50 MCG/ACT nasal spray Place 1 spray into both nostrils daily. Patient not taking: No sig reported 11/09/20 03/08/21  Rudolpho Sevin, NP    Family History Family History  Problem Relation Age of Onset  . Stroke Mother   . Hypertension Mother   . Diabetes Brother   . Stroke Brother   . Heart failure Father     Social History Social History   Tobacco Use  . Smoking status: Never Smoker  . Smokeless tobacco: Never Used  Vaping Use  . Vaping Use: Never used  Substance Use Topics  . Alcohol use: No  . Drug use: No     Allergies   Fish allergy, Famotidine, Prednisone, Shellfish allergy, and Iodine   Review of Systems Review of Systems  All other systems reviewed and are negative.    Physical Exam Triage Vital Signs ED Triage Vitals  Enc Vitals Group     BP 03/08/21 1827 123/78     Pulse Rate 03/08/21 1827 71     Resp 03/08/21 1827 16     Temp 03/08/21 1827 98.3 F (36.8 C)     Temp Source 03/08/21 1827 Oral     SpO2 03/08/21  1827 100 %     Weight --      Height --      Head Circumference --      Peak Flow --      Pain Score 03/08/21 1826 0     Pain Loc --      Pain Edu? --      Excl. in [place]? --    No data found.  Updated Vital Signs BP 123/78 (BP Location: Right Arm)   Pulse 71   Temp 98.3 F (36.8 C) (Oral)   Resp 16   SpO2 100%   Visual Acuity Right Eye Distance:   Left Eye Distance:   Bilateral Distance:    Right Eye Near:   Left Eye Near:    Bilateral Near:     Physical Exam Vitals reviewed.  Constitutional:      Appearance: Normal appearance. She is not diaphoretic.  HENT:     Head: Normocephalic and atraumatic.     Mouth/Throat:     Mouth: Mucous membranes are moist.  Eyes:     Extraocular Movements: Extraocular movements intact.     Pupils: Pupils are equal, round, and reactive to light.   Cardiovascular:     Rate and Rhythm: Normal rate and regular rhythm.     Pulses:          Radial pulses are 2+ on the right side and 2+ on the left side.     Heart sounds: Normal heart sounds.  Pulmonary:     Effort: Pulmonary effort is normal.     Breath sounds: Normal breath sounds.  Abdominal:     Palpations: Abdomen is soft.     Tenderness: There is no abdominal tenderness. There is no guarding or rebound.  Musculoskeletal:     Right lower leg: No edema.     Left lower leg: No edema.  Skin:    General: Skin is warm.     Capillary Refill: Capillary refill takes less than 2 seconds.  Neurological:     General: No focal deficit present.     Mental Status: She is alert and oriented to person, place, and time.  Psychiatric:        Mood and Affect: Mood normal.        Behavior: Behavior normal.        Thought Content: Thought content normal.        Judgment: Judgment normal.      UC Treatments / Results  Labs (all labs ordered are listed, but only abnormal results are displayed) Labs Reviewed - No data to display  EKG   Radiology No results found.  Procedures Procedures (including critical care time)  Medications Ordered in UC Medications - No data to display  Initial Impression / Assessment and Plan / UC Course  I have reviewed the triage vital signs and the nursing notes.  Pertinent labs & imaging results that were available during my care of the patient were reviewed by me and considered in my medical decision making (see chart for details).     This patient is a 60 year old female presenting for medication refill on her lisinopril.  Has been on this for years for hypertension.  Refilled today for 3 months.  Follow-up with PCP for further refills.  Final Clinical Impressions(s) / UC Diagnoses   Final diagnoses:  Medication refill  Essential hypertension     Discharge Instructions     -Continue Lisinopril once daily.  I provided enough for 3  months.     ED Prescriptions    Medication Sig Dispense Auth. Provider   lisinopril (ZESTRIL) 10 MG tablet Take 1 tablet (10 mg total) by mouth daily. 30 tablet Hazel Sams, PA-C     PDMP not reviewed this encounter.   Hazel Sams, PA-C 03/08/21 Einar Crow

## 2021-03-08 NOTE — Discharge Instructions (Addendum)
-  Continue Lisinopril once daily.  I provided enough for 3 months.

## 2021-05-31 ENCOUNTER — Encounter (HOSPITAL_COMMUNITY): Payer: Self-pay

## 2021-05-31 ENCOUNTER — Other Ambulatory Visit: Payer: Self-pay

## 2021-05-31 ENCOUNTER — Ambulatory Visit (HOSPITAL_COMMUNITY)
Admission: EM | Admit: 2021-05-31 | Discharge: 2021-05-31 | Disposition: A | Discharge status: Home / Self Care | Payer: 59 | Attending: Internal Medicine | Admitting: Internal Medicine

## 2021-05-31 DIAGNOSIS — M549 Dorsalgia, unspecified: Secondary | ICD-10-CM

## 2021-05-31 MED ORDER — IBUPROFEN 600 MG PO TABS
600.0000 mg | ORAL_TABLET | Freq: Four times a day (QID) | ORAL | 0 refills | Status: DC | PRN
Start: 2021-05-31 — End: 2022-05-29

## 2021-05-31 NOTE — ED Triage Notes (Addendum)
Pt states she was hit by a bin, today around 11:15am at work and got dizzy. Pt has some pain to left upper back.

## 2021-05-31 NOTE — ED Provider Notes (Signed)
[facility]    CSN: [id number] Arrival date & time: 05/31/21  1247      History   Chief Complaint Chief Complaint  Patient presents with   Dizziness    HPI Debbie Howard is a 60 y.o. female comes to the urgent care after being hit by a bin a few hours ago.  Patient was at work when the event happened.  She denies any head injury or fall.  Patient had some transient dizziness but that has resolved.  Patient complains about upper back pain.  Pain is throbbing, currently 2 out of 10 and aggravated by palpation.  No numbness or tingling.  No weakness in the upper extremities.  No chest or abdominal pain.  No radiation of pain.  She has not tried any over-the-counter medications.  HPI  Past Medical History:  Diagnosis Date   CVA (cerebral infarction) 2009   Right frontal lobe precentral gyrus infarct   Hypertension    Meningioma ([place]) 2009    Probable right anterior frontal meningioma.    Panic attacks    SAB (spontaneous abortion)    Stroke ([place])    Transient ischemic attack 2009   x 3    Patient Active Problem List   Diagnosis Date Noted   MENINGIOMA 03/14/2008   CEREBROVASCULAR ACCIDENT 03/14/2008   ABORTION, SPONTANEOUS 03/14/2008   HYPERTENSION NEC 03/14/2008    Past Surgical History:  Procedure Laterality Date   BREAST SURGERY      OB History   No obstetric history on file.      Home Medications    Prior to Admission medications   Medication Sig Start Date End Date Taking? Authorizing Provider  ibuprofen (ADVIL) 600 MG tablet Take 1 tablet (600 mg total) by mouth every 6 (six) hours as needed. 05/31/21  Yes Myrka Sylva, Myrene Galas, MD  aspirin EC 325 MG tablet Take 325 mg by mouth 2 (two) times daily as needed for mild pain.     [provider]  cyclobenzaprine (FLEXERIL) 5 MG tablet Take 1 tablet (5 mg total) by mouth 3 (three) times daily as needed for muscle spasms. 01/15/21   Chase Picket, MD  lisinopril (ZESTRIL) 10 MG tablet Take 1  tablet (10 mg total) by mouth daily. 03/08/21   Hazel Sams, PA-C  Multiple Vitamin (MULTIVITAMIN) tablet Take 1 tablet by mouth daily.    [provider]  diphenhydrAMINE (BENADRYL) 25 MG tablet Take 1 tablet (25 mg total) by mouth every 6 (six) hours as needed. 04/23/20 11/20/20  Isla Pence, MD  FLUoxetine (PROZAC) 10 MG tablet Take 1 tablet (10 mg total) by mouth daily. 01/09/19 11/11/19  Robyn Haber, MD  fluticasone (FLONASE) 50 MCG/ACT nasal spray Place 1 spray into both nostrils daily. Patient not taking: No sig reported 11/09/20 03/08/21  Rudolpho Sevin, NP    Family History Family History  Problem Relation Age of Onset   Stroke Mother    Hypertension Mother    Diabetes Brother    Stroke Brother    Heart failure Father     Social History Social History   Tobacco Use   Smoking status: Never   Smokeless tobacco: Never  Vaping Use   Vaping Use: Never used  Substance Use Topics   Alcohol use: No   Drug use: No     Allergies   Fish allergy, Famotidine, Prednisone, Shellfish allergy, and Iodine   Review of Systems Review of Systems  Constitutional: Negative.   HENT:  Negative.    Respiratory: Negative.    Cardiovascular:  Negative for chest pain.  Gastrointestinal:  Negative for abdominal pain.  Genitourinary: Negative.   Musculoskeletal:  Positive for back pain. Negative for arthralgias, neck pain and neck stiffness.  Neurological: Negative.     Physical Exam Triage Vital Signs ED Triage Vitals  Enc Vitals Group     BP      Pulse      Resp      Temp      Temp src      SpO2      Weight      Height      Head Circumference      Peak Flow      Pain Score      Pain Loc      Pain Edu?      Excl. in [employer]?    No data found.  Updated Vital Signs BP (!) 144/78 (BP Location: Left Arm)   Pulse 74   Temp 97.6 F (36.4 C) (Oral)   Resp 18   SpO2 97%   Visual Acuity Right Eye Distance:   Left Eye Distance:   Bilateral Distance:     Right Eye Near:   Left Eye Near:    Bilateral Near:     Physical Exam Vitals and nursing note reviewed.  Constitutional:      General: She is not in acute distress.    Appearance: She is not ill-appearing or diaphoretic.  Cardiovascular:     Rate and Rhythm: Normal rate and regular rhythm.     Pulses: Normal pulses.     Heart sounds: Normal heart sounds.  Pulmonary:     Effort: Pulmonary effort is normal.     Breath sounds: Normal breath sounds.  Neurological:     General: No focal deficit present.     Mental Status: She is alert and oriented to person, place, and time.     UC Treatments / Results  Labs (all labs ordered are listed, but only abnormal results are displayed) Labs Reviewed - No data to display  EKG   Radiology No results found.  Procedures Procedures (including critical care time)  Medications Ordered in UC Medications - No data to display  Initial Impression / Assessment and Plan / UC Course  I have reviewed the triage vital signs and the nursing notes.  Pertinent labs & imaging results that were available during my care of the patient were reviewed by me and considered in my medical decision making (see chart for details).     1.  Upper back pain: Gentle range of motion exercises Tylenol/Motrin as needed for pain Gentle range of motion exercises Return to urgent care if symptoms worsen There is no negation for imaging at this time. Final Clinical Impressions(s) / UC Diagnoses   Final diagnoses:  Upper back pain     Discharge Instructions      Gentle range of motion exercises Take anti-inflammatory agents as needed Return to urgent care if symptoms worsen.     ED Prescriptions     Medication Sig Dispense Auth. Provider   ibuprofen (ADVIL) 600 MG tablet Take 1 tablet (600 mg total) by mouth every 6 (six) hours as needed. 30 tablet Shaurya Rawdon, Myrene Galas, MD      PDMP not reviewed this encounter.   Chase Picket,  MD 06/01/21 [phone]

## 2021-05-31 NOTE — Discharge Instructions (Addendum)
Gentle range of motion exercises Take anti-inflammatory agents as needed Return to urgent care if symptoms worsen.

## 2021-07-10 ENCOUNTER — Ambulatory Visit (HOSPITAL_COMMUNITY)
Admission: EM | Admit: 2021-07-10 | Discharge: 2021-07-10 | Disposition: A | Discharge status: Home / Self Care | Payer: 59 | Attending: Emergency Medicine | Admitting: Emergency Medicine

## 2021-07-10 ENCOUNTER — Other Ambulatory Visit: Payer: Self-pay

## 2021-07-10 ENCOUNTER — Encounter (HOSPITAL_COMMUNITY): Payer: Self-pay

## 2021-07-10 DIAGNOSIS — Z760 Encounter for issue of repeat prescription: Secondary | ICD-10-CM

## 2021-07-10 DIAGNOSIS — I10 Essential (primary) hypertension: Secondary | ICD-10-CM

## 2021-07-10 MED ORDER — LISINOPRIL 10 MG PO TABS: 10.0000 mg | ORAL_TABLET | Freq: Every day | ORAL | 1 refills | Status: DC

## 2021-07-10 NOTE — Discharge Instructions (Signed)
Continue to take lisinopril as prescribed, you have been given wound medication for 2 months  You establish care as a patient 6 Months Ago [facility], you may call them to set up appointment to be evaluated  If you have concerns about your blood pressure please monitor typically times a week and record, take findings to primary care appointment  At any point if you begin to have chest pain, shortness of breath, blurred vision, seeing spots, severe headache while having a high blood pressure please go to the nearest emergency department for evaluation

## 2021-07-10 NOTE — ED Triage Notes (Signed)
Pt reports her blood pressure was high yesterday. States "blood pressure was 170 and can not remember the other number".

## 2021-07-12 NOTE — ED Provider Notes (Signed)
[facility]    CSN: [id number] Arrival date & time: 07/10/21  1446      History   Chief Complaint Chief Complaint  Patient presents with   Hypertension    HPI Debbie Howard is a 60 y.o. female.   Patient presents requesting medication refill of lisinopril, endorses that she lost medicine 2 days ago while in the process of moving from her home.  Endorses light generalized aching headache for 1 day and a elevated systolic blood pressure in the 170s yesterday.  Denies chest pain or tightness, shortness of breath, blurred vision, floaters, abdominal pain, nausea, vomiting.  Patient says she does not have a primary care and would like assistance to find one.  Past Medical History:  Diagnosis Date   CVA (cerebral infarction) 2009   Right frontal lobe precentral gyrus infarct   Hypertension    Meningioma ([facility]) 2009    Probable right anterior frontal meningioma.    Panic attacks    SAB (spontaneous abortion)    Stroke ([facility])    Transient ischemic attack 2009   x 3    Patient Active Problem List   Diagnosis Date Noted   MENINGIOMA 03/14/2008   CEREBROVASCULAR ACCIDENT 03/14/2008   ABORTION, SPONTANEOUS 03/14/2008   HYPERTENSION NEC 03/14/2008    Past Surgical History:  Procedure Laterality Date   BREAST SURGERY      OB History   No obstetric history on file.      Home Medications    Prior to Admission medications   Medication Sig Start Date End Date Taking? Authorizing Provider  aspirin EC 325 MG tablet Take 325 mg by mouth 2 (two) times daily as needed for mild pain.     [provider]  cyclobenzaprine (FLEXERIL) 5 MG tablet Take 1 tablet (5 mg total) by mouth 3 (three) times daily as needed for muscle spasms. 01/15/21   Chase Picket, MD  ibuprofen (ADVIL) 600 MG tablet Take 1 tablet (600 mg total) by mouth every 6 (six) hours as needed. 05/31/21   Lamptey, Myrene Galas, MD  lisinopril (ZESTRIL) 10 MG tablet Take 1 tablet (10 mg total) by mouth  daily. 07/10/21   Hans Eden, NP  Multiple Vitamin (MULTIVITAMIN) tablet Take 1 tablet by mouth daily.    [provider]  diphenhydrAMINE (BENADRYL) 25 MG tablet Take 1 tablet (25 mg total) by mouth every 6 (six) hours as needed. 04/23/20 11/20/20  Isla Pence, MD  FLUoxetine (PROZAC) 10 MG tablet Take 1 tablet (10 mg total) by mouth daily. 01/09/19 11/11/19  Robyn Haber, MD  fluticasone (FLONASE) 50 MCG/ACT nasal spray Place 1 spray into both nostrils daily. Patient not taking: No sig reported 11/09/20 03/08/21  Rudolpho Sevin, NP    Family History Family History  Problem Relation Age of Onset   Stroke Mother    Hypertension Mother    Diabetes Brother    Stroke Brother    Heart failure Father     Social History Social History   Tobacco Use   Smoking status: Never   Smokeless tobacco: Never  Vaping Use   Vaping Use: Never used  Substance Use Topics   Alcohol use: No   Drug use: No     Allergies   Fish allergy, Famotidine, Prednisone, Shellfish allergy, and Iodine   Review of Systems Review of Systems  Constitutional: Negative.   Respiratory: Negative.    Cardiovascular: Negative.   Neurological:  Positive for headaches. Negative for dizziness, tremors,  seizures, syncope, facial asymmetry, speech difficulty, weakness, light-headedness and numbness.    Physical Exam Triage Vital Signs ED Triage Vitals  Enc Vitals Group     BP 07/10/21 1525 133/84     Pulse Rate 07/10/21 1525 73     Resp 07/10/21 1525 18     Temp 07/10/21 1525 98.4 F (36.9 C)     Temp Source 07/10/21 1525 Oral     SpO2 07/10/21 1525 100 %     Weight --      Height --      Head Circumference --      Peak Flow --      Pain Score 07/10/21 1522 0     Pain Loc --      Pain Edu? --      Excl. in [place]? --    No data found.  Updated Vital Signs BP 133/84 (BP Location: Right Arm)   Pulse 73   Temp 98.4 F (36.9 C) (Oral)   Resp 18   SpO2 100%   Visual Acuity Right Eye  Distance:   Left Eye Distance:   Bilateral Distance:    Right Eye Near:   Left Eye Near:    Bilateral Near:     Physical Exam Constitutional:      Appearance: Normal appearance. She is normal weight.  Eyes:     Extraocular Movements: Extraocular movements intact.     Pupils: Pupils are equal, round, and reactive to light.  Cardiovascular:     Rate and Rhythm: Normal rate and regular rhythm.     Pulses: Normal pulses.     Heart sounds: Normal heart sounds.  Pulmonary:     Effort: Pulmonary effort is normal.     Breath sounds: Normal breath sounds.  Neurological:     General: No focal deficit present.     Mental Status: She is alert and oriented to person, place, and time. Mental status is at baseline.  Psychiatric:        Mood and Affect: Mood normal.        Behavior: Behavior normal.     UC Treatments / Results  Labs (all labs ordered are listed, but only abnormal results are displayed) Labs Reviewed - No data to display  EKG   Radiology No results found.  Procedures Procedures (including critical care time)  Medications Ordered in UC Medications - No data to display  Initial Impression / Assessment and Plan / UC Course  I have reviewed the triage vital signs and the nursing notes.  Pertinent labs & imaging results that were available during my care of the patient were reviewed by me and considered in my medical decision making (see chart for details).  Essential hypertension Encounter for a medication refill  1.lisinopril 10 mg daily, refilled for 7-monthsupply 2.  [facility] discussed with patient, advised her to make an appointment for follow-up regarding blood pressure 3.  Patient given strict return precautions for signs for hypertension urgency to go to nearest emergency department for evaluation  Final Clinical Impressions(s) / UC Diagnoses   Final diagnoses:  Encounter for medication refill  Essential  hypertension     Discharge Instructions      Continue to take lisinopril as prescribed, you have been given wound medication for 2 months  You establish care as a patient 6 Months Ago [facility], you may call them to set up appointment to be evaluated  If you have concerns about your blood pressure please monitor typically times a week and record, take findings to primary care appointment  At any point if you begin to have chest pain, shortness of breath, blurred vision, seeing spots, severe headache while having a high blood pressure please go to the nearest emergency department for evaluation   ED Prescriptions     Medication Sig Dispense Auth. Provider   lisinopril (ZESTRIL) 10 MG tablet Take 1 tablet (10 mg total) by mouth daily. 30 tablet Hans Eden, NP      PDMP not reviewed this encounter.   Hans Eden, [place] 07/12/21 [phone]

## 2021-11-12 ENCOUNTER — Other Ambulatory Visit: Payer: Self-pay

## 2021-11-12 ENCOUNTER — Telehealth: Payer: Self-pay | Admitting: Family

## 2021-11-12 DIAGNOSIS — I10 Essential (primary) hypertension: Secondary | ICD-10-CM

## 2021-11-12 MED ORDER — LISINOPRIL 10 MG PO TABS: 10.0000 mg | ORAL_TABLET | Freq: Every day | ORAL | 1 refills | Status: DC

## 2021-11-12 NOTE — Telephone Encounter (Signed)
Medication refilled, please schedule appt w/PCP for future refills

## 2021-11-12 NOTE — Telephone Encounter (Signed)
Pt states she's completely out of this medication and needs an urgent refill  lisinopril (ZESTRIL) 10 MG tablet [[id number]]  [facility] [place].  [address] [place] [address]  Phone:  [phone]  Fax:  [phone]

## 2021-11-19 ENCOUNTER — Ambulatory Visit: Payer: Self-pay | Admitting: Family Medicine

## 2021-12-13 ENCOUNTER — Other Ambulatory Visit: Payer: Self-pay | Admitting: Family

## 2021-12-13 DIAGNOSIS — I10 Essential (primary) hypertension: Secondary | ICD-10-CM

## 2021-12-14 NOTE — Telephone Encounter (Signed)
Schedule appointment?

## 2022-01-10 ENCOUNTER — Other Ambulatory Visit: Payer: Self-pay | Admitting: Family

## 2022-01-23 ENCOUNTER — Ambulatory Visit: Payer: Self-pay | Admitting: Critical Care Medicine

## 2022-01-28 ENCOUNTER — Telehealth: Payer: Self-pay | Admitting: *Deleted

## 2022-01-28 ENCOUNTER — Other Ambulatory Visit: Payer: Self-pay | Admitting: Family

## 2022-01-28 ENCOUNTER — Telehealth: Payer: Self-pay | Admitting: Family

## 2022-01-28 DIAGNOSIS — I10 Essential (primary) hypertension: Secondary | ICD-10-CM

## 2022-01-28 NOTE — Telephone Encounter (Signed)
Pt's daughter "Karena Addison" calling, angry affect. States pt called 5 hours ago for refill of Lisinopril and "It is not at pharmacy." Advised pt of timeframe, practice protocol and offered assistance. ? ?Noted last refill of med was 01/10/22 #30  0 refills. Asked if pt had meds available as 1 tab a day is ordered. Daughter states pt takes 2 a day "Since it's such a low dose." States "Told to take two in ED." Last ED visit 9/23, did not note dosage change.  Current med profile reads  'Lisinopril 10 mg 1 tablet by mouth daily.' Daughter then stated pt last took 2 tabs yesterday. Asked if pt's BP was running high, states "Usually 160/?."  ?Daughter perseverating on delay in response, attempted service recovery. Assured NT  would route to practice for PCPs review. Labs are also past due. Pt has appt 02/18/22 with Dr. Joya Gaskins. ?Please review. Thank you. ?

## 2022-01-28 NOTE — Telephone Encounter (Signed)
Pt's daughter called reporting that the patient  ?

## 2022-01-28 NOTE — Telephone Encounter (Signed)
Medication: lisinopril (ZESTRIL) 10 MG tablet [[id number]] ? ?Has the patient contacted their pharmacy? NO  ?(Agent: If no, request that the patient contact the pharmacy for the refill. If patient does not wish to contact the pharmacy document the reason why and proceed with request.) ?(Agent: If yes, when and what did the pharmacy advise?) ? ? ? ?Preferred Pharmacy (with phone number or street name): [facility] [address] ?[address]. [place] [address] ?Phone: [phone] Fax: [phone] ?Hours: Not open 24 hours ?Has the patient been seen for an appointment in the last year OR does the patient have an upcoming appointment? YES 02/18/22 ? ?Agent: Please be advised that RX refills may take up to 3 business days. We ask that you follow-up with your pharmacy. ?

## 2022-01-29 ENCOUNTER — Telehealth: Payer: Self-pay

## 2022-01-29 NOTE — Telephone Encounter (Signed)
Att to contact pt to advise that a courtesy 30 day refill has been sent in until she sees Dr Joya Gaskins on 02/18/22 pt should only be taking 1 tablet of Lisinopril 10 mg until she sees provider for further evaluation.  ?All numbers on pt chart either disconnected or invalid  ?

## 2022-01-29 NOTE — Telephone Encounter (Signed)
Medication has been refilled.

## 2022-01-29 NOTE — Telephone Encounter (Signed)
Already ordered in another encounter today. ?

## 2022-02-12 ENCOUNTER — Ambulatory Visit: Payer: Self-pay | Admitting: Critical Care Medicine

## 2022-02-17 NOTE — Progress Notes (Incomplete)
? ?New Patient Office Visit ? ?Subjective   ? ?Patient ID: Debbie Howard, female    DOB: 15-Oct-1961  Age: 61 y.o. MRN: [id number] ? ?CC: No chief complaint on file. ? ? ?HPI ?Debbie Howard presents to establish care ?Chv tdap pap colon mammo ?Saw Minette Brine PCE 11/2021 ?Current issues and/or concerns: ?States she needs a letter to return to work. 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 States she has been out of work since October 29, 2020 related to Covid. Her initial symptoms were sleeping a lot, coughing, chills, and fatigue. States when she attempted to return to work the following week she wasn't feeling well related to fibroids pain and was unable to complete her job to full capacity because of this. Says she was told to stay home until she is feeling better. States yesterday she called her job to let them know she is ready to return to work and was told she needs a Quarry manager from a provider to return to work.  ?  ?Reports she is feeling better. Denies symptoms of Covid. States she wanted a repeat Covid test before she returned to work and that local medical centers would not give her one because she may still be positive for Covid at least 90 days following infection. Says last week she took a home Covid test and that she is negative. Says she is getting exercise by walking and eating healthier ? ?1. Encounter to establish care: ?- Patient presents today to establish care.  ?- Return for annual physical examination, labs, and health maintenance. Arrive fasting meaning having had no food and/or nothing to drink for at least 8 hours prior to appointment. ?  ?2. Return to work evaluation: ?- States she needs a Quarry manager to return to work. 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 States she has been out of work since October 29, 2020 related to Covid. Her initial symptoms were sleeping a lot, coughing, chills, and fatigue. States when she attempted to return to work the following week she wasn't feeling well related to  fibroids pain and was unable to complete her job to full capacity because of this. Says she was told to stay home until she is feeling better. States yesterday she called her job to let them know she is ready to return to work and was told she needs a Quarry manager from a provider to return to work.  ?- Today reports she is feeling better. Denies symptoms of Covid. States she wanted a repeat Covid test before she returned to work and that local medical centers would not give her one because she may still be positive for Covid at least 90 days following infection. Says last week she took a home Covid test and that she is negative. Says she is getting exercise by walking and eating healthier. ?- Patient stable. Work note provided to return to work. Follow-up with primary provider as needed.  ?  ?  ?Outpatient Encounter Medications as of 02/18/2022  ?Medication Sig  ?? aspirin EC 325 MG tablet Take 325 mg by mouth 2 (two) times daily as needed for mild pain.   ?? cyclobenzaprine (FLEXERIL) 5 MG tablet Take 1 tablet (5 mg total) by mouth 3 (three) times daily as needed for muscle spasms.  ?? ibuprofen (ADVIL) 600 MG tablet Take 1 tablet (600 mg total) by mouth every 6 (six) hours as needed.  ?? lisinopril (ZESTRIL) 10 MG tablet Take 1 tablet by mouth once daily  ??  Multiple Vitamin (MULTIVITAMIN) tablet Take 1 tablet by mouth daily.  ?? [DISCONTINUED] diphenhydrAMINE (BENADRYL) 25 MG tablet Take 1 tablet (25 mg total) by mouth every 6 (six) hours as needed.  ?? [DISCONTINUED] FLUoxetine (PROZAC) 10 MG tablet Take 1 tablet (10 mg total) by mouth daily.  ?? [DISCONTINUED] fluticasone (FLONASE) 50 MCG/ACT nasal spray Place 1 spray into both nostrils daily. (Patient not taking: No sig reported)  ? ?No facility-administered encounter medications on file as of 02/18/2022.  ? ? ?Past Medical History:  ?Diagnosis Date  ?? CVA (cerebral infarction) 2009  ? Right frontal lobe precentral gyrus infarct  ?? Hypertension   ?? Meningioma [facility])  2009  ?  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   ?? Panic attacks   ?? SAB (spontaneous abortion)   ?? Stroke [facility])   ?? Transient ischemic attack 2009  ? x 3  ? ? ?Past Surgical History:  ?Procedure Laterality Date  ?? BREAST SURGERY    ? ? ?Family History  ?Problem Relation Age of Onset  ?? Stroke Mother   ?? Hypertension Mother   ?? Diabetes Brother   ?? Stroke Brother   ?? Heart failure Father   ? ? ?Social History  ? ?Socioeconomic History  ?? Marital status: Single  ?  Spouse name: Not on file  ?? Number of children: Not on file  ?? Years of education: Not on file  ?? Highest education level: Not on file  ?Occupational History  ?? Not on file  ?Tobacco Use  ?? Smoking status: Never  ?? Smokeless tobacco: Never  ?Vaping Use  ?? Vaping Use: Never used  ?Substance and Sexual Activity  ?? Alcohol use: No  ?? Drug use: No  ?? Sexual activity: Not Currently  ?Other Topics Concern  ?? Not on file  ?Social History Narrative  ? Pt works at a [employer]  ? ?Social Determinants of Health  ? ?Financial Resource Strain: Not on file  ?Food Insecurity: Not on file  ?Transportation Needs: Not on file  ?Physical Activity: Not on file  ?Stress: Not on file  ?Social Connections: Not on file  ?Intimate Partner Violence: Not on file  ? ? ?ROS ? ?  ? ? ?Objective   ? ?There were no vitals taken for this visit. ? ?Physical Exam ? ?{Labs (Optional):23779} ?  ? ?Assessment & Plan:  ? ?Problem List Items Addressed This Visit   ?None ? ? ?No follow-ups on file.  ? ?Asencion Noble, MD ? ? ?

## 2022-02-18 ENCOUNTER — Ambulatory Visit: Payer: Self-pay | Admitting: Critical Care Medicine

## 2022-02-18 ENCOUNTER — Ambulatory Visit
Admission: EM | Admit: 2022-02-18 | Discharge: 2022-02-18 | Disposition: A | Discharge status: Home / Self Care | Payer: Self-pay | Attending: Urgent Care | Admitting: Urgent Care

## 2022-02-18 DIAGNOSIS — I10 Essential (primary) hypertension: Secondary | ICD-10-CM

## 2022-02-18 DIAGNOSIS — R030 Elevated blood-pressure reading, without diagnosis of hypertension: Secondary | ICD-10-CM

## 2022-02-18 MED ORDER — LOSARTAN POTASSIUM 50 MG PO TABS: 50.0000 mg | ORAL_TABLET | Freq: Every day | ORAL | 0 refills | Status: DC

## 2022-02-18 NOTE — ED Triage Notes (Addendum)
Patient presents to Urgent Care with complaints of high blood pressure . Patient reports unable to get BP medication, due to issues with appointment time.  ? ?1037 Pt is on phone during triage with housing company reports homelessness.  ? ?1047 pt still on phone at this time ?

## 2022-02-18 NOTE — Discharge Instructions (Signed)

## 2022-02-18 NOTE — ED Provider Notes (Signed)
?[facility] ? ? ?MRN: [id number] DOB: 06/17/1961 ? ?Subjective:  ? ?Debbie Howard is a 61 y.o. female presenting for medication refill for her blood pressure.  Patient has Howard taking 2-3 doses of lisinopril a day at 10 mg.  This caused her to run out of her medication.  Denies any headache, confusion, weakness, chest pain.  She does have a history of stroke, TIAs. ? ?No current facility-administered medications for this encounter. ? ?Current Outpatient Medications:  ?  aspirin EC 325 MG tablet, Take 325 mg by mouth 2 (two) times daily as needed for mild pain. , Disp: , Rfl:  ?  cyclobenzaprine (FLEXERIL) 5 MG tablet, Take 1 tablet (5 mg total) by mouth 3 (three) times daily as needed for muscle spasms., Disp: 30 tablet, Rfl: 0 ?  ibuprofen (ADVIL) 600 MG tablet, Take 1 tablet (600 mg total) by mouth every 6 (six) hours as needed., Disp: 30 tablet, Rfl: 0 ?  lisinopril (ZESTRIL) 10 MG tablet, Take 1 tablet by mouth once daily, Disp: 30 tablet, Rfl: 0 ?  Multiple Vitamin (MULTIVITAMIN) tablet, Take 1 tablet by mouth daily., Disp: , Rfl:   ? ?Allergies  ?Allergen Reactions  ? Fish Allergy Hives  ? Famotidine Swelling  ?  REACTION: (per pt)  ? Prednisone Hives and Swelling  ? Shellfish Allergy Hives  ? Iodine Hives and Rash  ? ? ?Past Medical History:  ?Diagnosis Date  ? CVA (cerebral infarction) 2009  ? Right frontal lobe precentral gyrus infarct  ? Hypertension   ? Meningioma [facility]) 2009  ?  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   ? Panic attacks   ? SAB (spontaneous abortion)   ? Stroke [facility])   ? Transient ischemic attack 2009  ? x 3  ?  ? ?Past Surgical History:  ?Procedure Laterality Date  ? BREAST SURGERY    ? ? ?Family History  ?Problem Relation Age of Onset  ? Stroke Mother   ? Hypertension Mother   ? Diabetes Brother   ? Stroke Brother   ? Heart failure Father   ? ? ?Social History  ? ?Tobacco Use  ? Smoking status: Never  ? Smokeless tobacco: Never  ?Vaping Use  ? Vaping Use: Never used   ?Substance Use Topics  ? Alcohol use: No  ? Drug use: No  ? ? ?ROS ? ? ?Objective:  ? ?Vitals: ?BP (!) 159/84 (BP Location: Right Arm)   Pulse 63   Temp 97.9 ?F (36.6 ?C) (Oral)   Resp 18   SpO2 98%  ? ?Physical Exam ?Constitutional:   ?   General: She is not in acute distress. ?   Appearance: Normal appearance. She is well-developed. She is not ill-appearing, toxic-appearing or diaphoretic.  ?HENT:  ?   Head: Normocephalic and atraumatic.  ?   Nose: Nose normal.  ?   Mouth/Throat:  ?   Mouth: Mucous membranes are moist.  ?Eyes:  ?   General: No scleral icterus.    ?   Right eye: No discharge.     ?   Left eye: No discharge.  ?   Extraocular Movements: Extraocular movements intact.  ?Cardiovascular:  ?   Rate and Rhythm: Normal rate.  ?   Heart sounds: No murmur heard. ?  No friction rub. No gallop.  ?Pulmonary:  ?   Effort: Pulmonary effort is normal. No respiratory distress.  ?   Breath sounds: No stridor. No wheezing, rhonchi or rales.  ?Chest:  ?  Chest wall: No tenderness.  ?Skin: ?   General: Skin is warm and dry.  ?Neurological:  ?   General: No focal deficit present.  ?   Mental Status: She is alert and oriented to person, place, and time.  ?   Cranial Nerves: No cranial nerve deficit.  ?   Motor: No weakness.  ?   Coordination: Coordination normal.  ?   Gait: Gait normal.  ?   Deep Tendon Reflexes: Reflexes normal.  ?   Comments: Negative Romberg and pronator drift.  ?Psychiatric:  ?   Comments: Anxious demeanor.  ? ? ? ?Assessment and Plan :  ? ?PDMP not reviewed this encounter. ? ?1. Essential hypertension   ?2. Elevated blood pressure reading   ? ? ?Patient has concerns that her blood pressure medication is not working.  I will switch her to losartan 50 mg.  Emphasized medical compliance and advised that she just take 1 of these per day.  No signs of an acute encephalopathy on exam.  Low suspicion for recurrent stroke.  Recommended follow-up with her PCP as soon as possible. Counseled patient on  potential for adverse effects with medications prescribed/recommended today, ER and return-to-clinic precautions discussed, patient verbalized understanding. ? ?  ?Jaynee Eagles, PA-C ?02/18/22 1111 ? ?

## 2022-02-18 NOTE — ED Triage Notes (Signed)
Pt reports taking 2-3 lisinoprils a day to help with bp, pt also co of cholesterol.  ?

## 2022-05-01 ENCOUNTER — Emergency Department (HOSPITAL_COMMUNITY)
Admission: EM | Admit: 2022-05-01 | Discharge: 2022-05-01 | Disposition: A | Discharge status: Home / Self Care | Payer: Self-pay | Attending: Emergency Medicine | Admitting: Emergency Medicine

## 2022-05-01 ENCOUNTER — Emergency Department (HOSPITAL_COMMUNITY): Payer: Self-pay

## 2022-05-01 ENCOUNTER — Encounter (HOSPITAL_COMMUNITY): Payer: Self-pay | Admitting: Emergency Medicine

## 2022-05-01 DIAGNOSIS — R0789 Other chest pain: Secondary | ICD-10-CM | POA: Insufficient documentation

## 2022-05-01 DIAGNOSIS — Z79899 Other long term (current) drug therapy: Secondary | ICD-10-CM | POA: Insufficient documentation

## 2022-05-01 DIAGNOSIS — Z8673 Personal history of transient ischemic attack (TIA), and cerebral infarction without residual deficits: Secondary | ICD-10-CM | POA: Insufficient documentation

## 2022-05-01 DIAGNOSIS — I10 Essential (primary) hypertension: Secondary | ICD-10-CM | POA: Insufficient documentation

## 2022-05-01 LAB — CBC
HCT: 40.5 % (ref 36.0–46.0)
Hemoglobin: 13.1 g/dL (ref 12.0–15.0)
MCH: 28.5 pg (ref 26.0–34.0)
MCHC: 32.3 g/dL (ref 30.0–36.0)
MCV: 88.2 fL (ref 80.0–100.0)
Platelets: 281 10*3/uL (ref 150–400)
RBC: 4.59 MIL/uL (ref 3.87–5.11)
RDW: 13.1 % (ref 11.5–15.5)
WBC: 3.7 10*3/uL — ABNORMAL LOW (ref 4.0–10.5)
nRBC: 0 % (ref 0.0–0.2)

## 2022-05-01 LAB — BASIC METABOLIC PANEL
Anion gap: 11 (ref 5–15)
BUN: 15 mg/dL (ref 8–23)
CO2: 24 mmol/L (ref 22–32)
Calcium: 9.4 mg/dL (ref 8.9–10.3)
Chloride: 107 mmol/L (ref 98–111)
Creatinine, Ser: 0.74 mg/dL (ref 0.44–1.00)
GFR, Estimated: >60 mL/min (ref >60)
Glucose, Bld: 91 mg/dL (ref 70–99)
Potassium: 3.7 mmol/L (ref 3.5–5.1)
Sodium: 142 mmol/L (ref 135–145)

## 2022-05-01 LAB — TROPONIN I (HIGH SENSITIVITY)
Troponin I (High Sensitivity): 5 ng/L (ref <18)
Troponin I (High Sensitivity): 4 ng/L (ref <18)

## 2022-05-01 NOTE — ED Triage Notes (Addendum)
Patient here with complaint of chest pain that started at noon today after a verbal altercation with a supervisor at her workplace. At onset, patient reports that pain was 7/10 but has improved to 3/10 with breathing exercises. Patient is alert, oriented, ambulatory, speaking in complete sentences, and is in no apparent distress at this time.

## 2022-05-01 NOTE — ED Notes (Signed)
Patient verbalizes understanding of discharge instructions. Opportunity for questioning and answers were provided. Pt discharged from ED. 

## 2022-05-01 NOTE — ED Provider Triage Note (Signed)
Emergency Medicine Provider Triage Evaluation Note  Debbie Howard , a 61 y.o. female  was evaluated in triage.  Pt complains of chest pain that occurred after a verbal altercation at work just prior to arrival.  Pain has significantly improved after breathing exercises.  States that she was confronted similarly a few days ago at work when she had similar symptoms including nausea.  No leg swelling.  No shortness of breath.  Review of Systems  Positive: Chest pain Negative: Leg swelling, shortness of breath  Physical Exam  BP 139/89 (BP Location: Right Arm)   Pulse 68   Temp 98.4 F (36.9 C) (Oral)   Resp 16   SpO2 100%  Gen:   Awake, no distress   Resp:  Normal effort  MSK:   Moves extremities without difficulty  Other:  Speaking complete sentences without difficulty.  No signs of distress  Medical Decision Making  Medically screening exam initiated at 1:49 PM.  Appropriate orders placed.  Debbie Howard was informed that the remainder of the evaluation will be completed by another provider, this initial triage assessment does not replace that evaluation, and the importance of remaining in the ED until their evaluation is complete.  Labs ordered   Delia Heady, [place] 05/01/22 1350

## 2022-05-01 NOTE — ED Provider Notes (Signed)
[facility] EMERGENCY DEPARTMENT Provider Note   CSN: [id number] Arrival date & time: 05/01/22  1257     History  Chief Complaint  Patient presents with   Chest Pain    Debbie Howard is a 61 y.o. female.  Pt is a 61 yo with a pmhx significant for htn, cva, and anxiety.  Pt presents to the ED today with cp.  Pt said she got into an argument with her boss at [facility].  She developed cp after the argument.  She has no cp now.  No sob.  No n/v.  No f/c.         Home Medications Prior to Admission medications   Medication Sig Start Date End Date Taking? Authorizing Provider  aspirin EC 325 MG tablet Take 325 mg by mouth once a week.   Yes [provider]  ibuprofen (ADVIL) 600 MG tablet Take 1 tablet (600 mg total) by mouth every 6 (six) hours as needed. Patient taking differently: Take 600 mg by mouth every 6 (six) hours as needed for headache or mild pain. 05/31/21  Yes Lamptey, Myrene Galas, MD  losartan (COZAAR) 50 MG tablet Take 1 tablet (50 mg total) by mouth daily. 02/18/22  Yes Jaynee Eagles, PA-C  Multiple Vitamin (MULTIVITAMIN) tablet Take 1 tablet by mouth daily.   Yes [provider]  cyclobenzaprine (FLEXERIL) 5 MG tablet Take 1 tablet (5 mg total) by mouth 3 (three) times daily as needed for muscle spasms. Patient not taking: Reported on 05/01/2022 01/15/21   Chase Picket, MD  diphenhydrAMINE (BENADRYL) 25 MG tablet Take 1 tablet (25 mg total) by mouth every 6 (six) hours as needed. 04/23/20 11/20/20  Isla Pence, MD  FLUoxetine (PROZAC) 10 MG tablet Take 1 tablet (10 mg total) by mouth daily. 01/09/19 11/11/19  Robyn Haber, MD  fluticasone (FLONASE) 50 MCG/ACT nasal spray Place 1 spray into both nostrils daily. Patient not taking: No sig reported 11/09/20 03/08/21  Rudolpho Sevin, NP      Allergies    Fish allergy, Famotidine, Prednisone, Shellfish allergy, and Iodine    Review of Systems   Review of Systems  Cardiovascular:   Positive for chest pain.  All other systems reviewed and are negative.   Physical Exam Updated Vital Signs BP (!) 166/85   Pulse 60   Temp 98.4 F (36.9 C) (Oral)   Resp 17   SpO2 97%  Physical Exam Vitals and nursing note reviewed.  Constitutional:      Appearance: She is well-developed.  HENT:     Head: Normocephalic and atraumatic.  Eyes:     Extraocular Movements: Extraocular movements intact.     Pupils: Pupils are equal, round, and reactive to light.  Cardiovascular:     Rate and Rhythm: Normal rate and regular rhythm.     Heart sounds: Normal heart sounds.  Pulmonary:     Effort: Pulmonary effort is normal.     Breath sounds: Normal breath sounds.  Abdominal:     General: Bowel sounds are normal.     Palpations: Abdomen is soft.  Musculoskeletal:        General: Normal range of motion.     Cervical back: Normal range of motion and neck supple.  Skin:    General: Skin is warm.     Capillary Refill: Capillary refill takes less than 2 seconds.  Neurological:     General: No focal deficit present.     Mental Status: She  is alert and oriented to person, place, and time.  Psychiatric:        Mood and Affect: Mood normal.        Behavior: Behavior normal.     ED Results / Procedures / Treatments   Labs (all labs ordered are listed, but only abnormal results are displayed) Labs Reviewed  CBC - Abnormal; Notable for the following components:      Result Value   WBC 3.7 (*)    All other components within normal limits  BASIC METABOLIC PANEL  TROPONIN I (HIGH SENSITIVITY)  TROPONIN I (HIGH SENSITIVITY)    EKG EKG Interpretation  Date/Time:  Wednesday May 01 2022 13:46:39 EDT Ventricular Rate:  71 PR Interval:  148 QRS Duration: 80 QT Interval:  416 QTC Calculation: 452 R Axis:   -18 Text Interpretation: Normal sinus rhythm with sinus arrhythmia Minimal voltage criteria for LVH, may be normal variant ( R in aVL ) Borderline ECG When compared with ECG of  26-Feb-2020 16:21, PREVIOUS ECG IS PRESENT No significant change since last tracing Confirmed by Isla Pence [phone]) on 05/01/2022 4:15:07 PM  Radiology DG Chest 2 View  Result Date: 05/01/2022 CLINICAL DATA:  Chest pain EXAM: CHEST - 2 VIEW COMPARISON:  02/26/2020 FINDINGS: The lungs are clear without focal pneumonia, edema, pneumothorax or pleural effusion. The cardiopericardial silhouette is within normal limits for size. Small hiatal hernia evident. The visualized bony structures of the thorax are unremarkable. Stable left breast calcification. IMPRESSION: No active cardiopulmonary disease. Electronically Signed   By: Misty Stanley M.D.   On: 05/01/2022 14:52    Procedures Procedures    Medications Ordered in ED Medications - No data to display  ED Course/ Medical Decision Making/ A&P                           Medical Decision Making Amount and/or Complexity of Data Reviewed Labs: ordered.   This patient presents to the ED for concern of chest pain, this involves an extensive number of treatment options, and is a complaint that carries with it a high risk of complications and morbidity.  The differential diagnosis includes cardiac, pulm, gi, stress   Co morbidities that complicate the patient evaluation  htn, cva, and anxiety   Additional history obtained:  Additional history obtained from epic chart review    Lab Tests:  I Ordered, and personally interpreted labs.  The pertinent results include:  cbc, bmp, trop nl   Imaging Studies ordered:  I ordered imaging studies including cxr  I independently visualized and interpreted imaging which showed  IMPRESSION:  No active cardiopulmonary disease.   I agree with the radiologist interpretation   Cardiac Monitoring:  The patient was maintained on a cardiac monitor.  I personally viewed and interpreted the cardiac monitored which showed an underlying rhythm of: nsr   Medicines ordered and prescription drug  management:  I have reviewed the patients home medicines and have made adjustments as needed   Test Considered:  Cta, but no sob or tachycardia   Critical Interventions:  trop   Problem List / ED Course:  Chest pain:  atypical.  Pt did not want to wait for 2nd trop.  I have low suspicion for CAD.   Reevaluation:  After the interventions noted above, I reevaluated the patient and found that they have :improved   Social Determinants of Health:  No insurance   Dispostion:  After consideration of the diagnostic  results and the patients response to treatment, I feel that the patent would benefit from discharge with outpatient f/u.          Final Clinical Impression(s) / ED Diagnoses Final diagnoses:  Atypical chest pain    Rx / DC Orders ED Discharge Orders     None         Isla Pence, MD 05/01/22 1750

## 2022-05-23 ENCOUNTER — Ambulatory Visit: Payer: Self-pay
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
## 2022-05-23 NOTE — Telephone Encounter (Signed)
Noted  

## 2022-05-27 ENCOUNTER — Telehealth: Payer: Self-pay | Admitting: Emergency Medicine

## 2022-05-27 ENCOUNTER — Ambulatory Visit: Payer: Self-pay | Admitting: *Deleted

## 2022-05-27 NOTE — Telephone Encounter (Signed)
Add this pt on tomorrow afternoon ok to double book

## 2022-05-27 NOTE — Telephone Encounter (Signed)
Summary: right hip disocmfort   The patient's right hip has caused them discomfort for roughly a week   The patient's job has become extremely stressful and the patient feels this is related to their stress   The patient shares that the discomfort in their hip happens when they stand, walk or move   Please contact further when possible        Chief Complaint: right hip pain and stress at job, requesting FMLA papers to be completed  Symptoms: right hip pain, achy pain shoots down bilateral legs. Pain with walking and climbing stairs. Difficulty sleeping. Chest pain reported 2 weeks ago but none now . Tylenol not effective for pain management.  Frequency: 1 week ago Pertinent Negatives: Patient denies chest pain no difficulty breathing. No fever. No back pain  Disposition: '[]'$ ED /'[x]'$ Urgent Care (no appt availability in office) / '[]'$ Appointment(In office/virtual)/ '[]'$  [facility]/ '[]'$ Home Care/ '[x]'$ Refused Recommended Disposition /'[]'$ [facility]/ '[]'$  Follow-up with PCP Additional Notes:   No available appt until 06/19/22 with any provider. Patient requesting to be seen and requesting FMLA papers to be completed due to difficulty doing job. Patient reports she would like to bring paperwork to office. Please advise .      Reason for Disposition  [1] MODERATE pain (e.g., interferes with normal activities, limping) AND [2] present > 3 days  Answer Assessment - Initial Assessment Questions 1. LOCATION and RADIATION: "Where is the pain located?"      Back of right hip  2. QUALITY: "What does the pain feel like?"  (e.g., sharp, dull, aching, burning)     Achy  3. SEVERITY: "How bad is the pain?" "What does it keep you from doing?"   (Scale 1-10; or mild, moderate, severe)   -  MILD (1-3): doesn't interfere with normal activities    -  MODERATE (4-7): interferes with normal activities (e.g., work or school) or awakens from sleep, limping    -  SEVERE (8-10): excruciating pain,  unable to do any normal activities, unable to walk     Moderate, limping  4. ONSET: "When did the pain start?" "Does it come and go, or is it there all the time?"     1 week  5. WORK OR EXERCISE: "Has there been any recent work or exercise that involved this part of the body?"      No  6. CAUSE: "What do you think is causing the hip pain?"      Not sure  7. AGGRAVATING FACTORS: "What makes the hip pain worse?" (e.g., walking, climbing stairs, running)     Walking , climbing stairs  8. OTHER SYMPTOMS: "Do you have any other symptoms?" (e.g., back pain, pain shooting down leg,  fever, rash)     Right and left pain shooting down leg  Protocols used: Hip Pain-A-AH

## 2022-05-27 NOTE — Telephone Encounter (Signed)
Pls advise of appt.

## 2022-05-27 NOTE — Telephone Encounter (Signed)
Called patient  and left voicemail  Patient needs an appointment for FMLA, routing to pcp so he is aware

## 2022-05-27 NOTE — Telephone Encounter (Signed)
Copied from Debbie Howard [id number]. Topic: Appointment Scheduling - Scheduling Inquiry for Clinic >> May 27, 2022  9:48 AM Everette C wrote: Reason for CRM: The patient would like to be seen for the completion of their FMLA paperwork   The patient shares that the paperwork needs to be completed within the next week   The patient's employer has stressed the urgency of their request   There were no available appointment at the time of call with patient   Please contact further when possible

## 2022-05-27 NOTE — Telephone Encounter (Signed)
See other msg   pls add pt on tomorrow PM

## 2022-05-28 NOTE — Telephone Encounter (Signed)
Appt scheduled already for 05/29/2022 with Dr. Joya Gaskins.

## 2022-05-28 NOTE — Telephone Encounter (Signed)
Called patient and scheduled her an appointment for tomorrow. Was unable to do today because of court

## 2022-05-28 NOTE — Progress Notes (Signed)
   Established Patient Office Visit  Subjective   Patient ID: Debbie Howard, female    DOB: Nov 20, 1960  Age: 61 y.o. MRN: [id number]  No chief complaint on file.   I was identified as pcp but pt sees Amy Stephens at [place]   hip pain   {History (Optional):23778}  ROS    Objective:     There were no vitals taken for this visit. {Vitals History (Optional):23777}  Physical Exam   No results found for any visits on 05/29/22.  {Labs (Optional):23779}  The ASCVD Risk score (Arnett DK, et al., 2019) failed to calculate for the following reasons:   The patient has a prior MI or stroke diagnosis    Assessment & Plan:   Problem List Items Addressed This Visit   None   No follow-ups on file.    Asencion Noble, MD

## 2022-05-29 ENCOUNTER — Other Ambulatory Visit: Payer: Self-pay | Admitting: Critical Care Medicine

## 2022-05-29 ENCOUNTER — Ambulatory Visit
Admission: RE | Admit: 2022-05-29 | Discharge: 2022-05-29 | Disposition: A | Discharge status: Home / Self Care | Payer: Self-pay | Source: Ambulatory Visit | Attending: Critical Care Medicine | Admitting: Critical Care Medicine

## 2022-05-29 ENCOUNTER — Encounter: Payer: Self-pay | Admitting: Critical Care Medicine

## 2022-05-29 ENCOUNTER — Ambulatory Visit: Payer: Self-pay | Attending: Critical Care Medicine | Admitting: Critical Care Medicine

## 2022-05-29 VITALS — BP 123/80 | HR 84 | Temp 97.8°F | Resp 16 | Ht 65.0 in | Wt 144.8 lb

## 2022-05-29 DIAGNOSIS — Z139 Encounter for screening, unspecified: Secondary | ICD-10-CM

## 2022-05-29 DIAGNOSIS — I10 Essential (primary) hypertension: Secondary | ICD-10-CM

## 2022-05-29 DIAGNOSIS — Z1231 Encounter for screening mammogram for malignant neoplasm of breast: Secondary | ICD-10-CM

## 2022-05-29 DIAGNOSIS — M25551 Pain in right hip: Secondary | ICD-10-CM

## 2022-05-29 DIAGNOSIS — Z8673 Personal history of transient ischemic attack (TIA), and cerebral infarction without residual deficits: Secondary | ICD-10-CM

## 2022-05-29 DIAGNOSIS — E782 Mixed hyperlipidemia: Secondary | ICD-10-CM

## 2022-05-29 MED ORDER — ASPIRIN 81 MG PO TBEC: 81.0000 mg | DELAYED_RELEASE_TABLET | Freq: Every day | ORAL | 12 refills | Status: DC

## 2022-05-29 MED ORDER — LOSARTAN POTASSIUM 50 MG PO TABS: 50.0000 mg | ORAL_TABLET | Freq: Every day | ORAL | 2 refills | Status: DC

## 2022-05-29 MED ORDER — CYCLOBENZAPRINE HCL 5 MG PO TABS: 5.0000 mg | ORAL_TABLET | Freq: Three times a day (TID) | ORAL | 0 refills | Status: DC | PRN

## 2022-05-29 MED ORDER — DICLOFENAC SODIUM 1 % EX GEL: 4.0000 g | Freq: Four times a day (QID) | CUTANEOUS | 2 refills | Status: DC

## 2022-05-29 MED ORDER — LIDOCAINE 5 % EX PTCH: 1.0000 | MEDICATED_PATCH | CUTANEOUS | 0 refills | Status: DC

## 2022-05-29 NOTE — Assessment & Plan Note (Addendum)
Acute hip pain consistent with iliotibial band syndrome versus bursitis of the hip  We will plan to apply lidocaine patch topically to the right hip and Voltaren gel.  Patient will receive x-ray of the hip and also referral to orthopedics  FMLA paperwork and work note filled out for the patient  She will medially 6 weeks out of work unless they can give her a sedentary job until she is fully assessed and treated by orthopedics

## 2022-05-29 NOTE — Patient Instructions (Signed)
A mammogram will be ordered  Complete screening labs obtained at this visit  Resume aspirin take the 81 mg enteric-coated daily  Refills on cyclobenzaprine and losartan sent to the [facility]  Begin Voltaren gel applied to the affected area in the right hip 4 times daily and obtain lidocaine patch applied 12 hours during the day to the right hip  Do stretch exercises of your iliotibial band as seen on attachment  Imaging of the right hip will be obtained go to the x-ray department downstairs  Family was filled out and a note given to you  Blood pressure is in good control  We will screen you for hypercholesterolemia you may need to obtain a cholesterol pill we will call you the results  Return to your primary care provider Ms. Gerilyn Nestle at primary care and [facility] for return visit in 6 weeks and follow-up  Referral to orthopedics was made

## 2022-05-29 NOTE — Progress Notes (Signed)
Medical refill Needs paperwork fill out Right hip hurst when working, standing and bending. Pain level 6 Needs to know blood type Sugar level

## 2022-05-29 NOTE — Assessment & Plan Note (Signed)
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  Patient has not had a recent lipid panel and I am wondering why she is not on statin therapy  Patient needs to remain on aspirin and we will switch her to low-dose 81 mg enteric-coated  Once lipid panel returns if it shows abnormality she will need to start statins

## 2022-05-29 NOTE — Assessment & Plan Note (Signed)
Need to reassess lipids

## 2022-05-29 NOTE — Assessment & Plan Note (Signed)
Blood pressure well controlled will refill losartan and assess metabolic panel and blood counts

## 2022-05-29 NOTE — Assessment & Plan Note (Signed)
Patient is due mammogram will order

## 2022-05-29 NOTE — Assessment & Plan Note (Signed)
We will order other health screenings

## 2022-05-30 ENCOUNTER — Other Ambulatory Visit: Payer: Self-pay | Admitting: Critical Care Medicine

## 2022-05-30 DIAGNOSIS — E782 Mixed hyperlipidemia: Secondary | ICD-10-CM

## 2022-05-30 LAB — COMPREHENSIVE METABOLIC PANEL
ALT: 15 IU/L (ref 0–32)
AST: 14 IU/L (ref 0–40)
Albumin/Globulin Ratio: 1.4 (ref 1.2–2.2)
Albumin: 4.3 g/dL (ref 3.9–4.9)
Alkaline Phosphatase: 84 IU/L (ref 44–121)
BUN/Creatinine Ratio: 16 (ref 12–28)
BUN: 12 mg/dL (ref 8–27)
Bilirubin Total: 0.3 mg/dL (ref 0.0–1.2)
CO2: 25 mmol/L (ref 20–29)
Calcium: 9.5 mg/dL (ref 8.7–10.3)
Chloride: 103 mmol/L (ref 96–106)
Creatinine, Ser: 0.74 mg/dL (ref 0.57–1.00)
Globulin, Total: 3.1 g/dL (ref 1.5–4.5)
Glucose: 99 mg/dL (ref 70–99)
Potassium: 4.1 mmol/L (ref 3.5–5.2)
Sodium: 142 mmol/L (ref 134–144)
Total Protein: 7.4 g/dL (ref 6.0–8.5)
eGFR: 91 mL/min/{1.73_m2} (ref >59)

## 2022-05-30 LAB — LIPID PANEL
Chol/HDL Ratio: 2.9 ratio (ref 0.0–4.4)
Cholesterol, Total: 198 mg/dL (ref 100–199)
HDL: 68 mg/dL (ref >39)
LDL Chol Calc (NIH): 121 mg/dL — ABNORMAL HIGH (ref 0–99)
Triglycerides: 49 mg/dL (ref 0–149)
VLDL Cholesterol Cal: 9 mg/dL (ref 5–40)

## 2022-05-30 LAB — CBC WITH DIFFERENTIAL/PLATELET
Basophils Absolute: 0 10*3/uL (ref 0.0–0.2)
Basos: 1 %
EOS (ABSOLUTE): 0.1 10*3/uL (ref 0.0–0.4)
Eos: 2 %
Hematocrit: 39.1 % (ref 34.0–46.6)
Hemoglobin: 13.1 g/dL (ref 11.1–15.9)
Immature Grans (Abs): 0 10*3/uL (ref 0.0–0.1)
Immature Granulocytes: 0 %
Lymphocytes Absolute: 1.4 10*3/uL (ref 0.7–3.1)
Lymphs: 39 %
MCH: 29.2 pg (ref 26.6–33.0)
MCHC: 33.5 g/dL (ref 31.5–35.7)
MCV: 87 fL (ref 79–97)
Monocytes Absolute: 0.3 10*3/uL (ref 0.1–0.9)
Monocytes: 9 %
Neutrophils Absolute: 1.8 10*3/uL (ref 1.4–7.0)
Neutrophils: 49 %
Platelets: 284 10*3/uL (ref 150–450)
RBC: 4.49 x10E6/uL (ref 3.77–5.28)
RDW: 12.2 % (ref 11.7–15.4)
WBC: 3.7 10*3/uL (ref 3.4–10.8)

## 2022-05-30 LAB — HEMOGLOBIN A1C
Est. average glucose Bld gHb Est-mCnc: 114 mg/dL
Hgb A1c MFr Bld: 5.6 % (ref 4.8–5.6)

## 2022-05-30 MED ORDER — ATORVASTATIN CALCIUM 20 MG PO TABS: 20.0000 mg | ORAL_TABLET | Freq: Every day | ORAL | 3 refills | Status: DC

## 2022-05-30 NOTE — Progress Notes (Signed)
Call patient to see if we can reach her so that we can give her recommendations from Asencion Noble, MD.

## 2022-05-30 NOTE — Progress Notes (Signed)
Tried to call this patient several times a day did not get a chance to try to reach this patient and let her know all her labs are within range her cholesterol is mildly elevated and she would benefit from starting a cholesterol pill with her history of stroke.  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

## 2022-05-31 ENCOUNTER — Telehealth: Payer: Self-pay

## 2022-05-31 NOTE — Telephone Encounter (Signed)
-----   Message from Elsie Stain, MD sent at 05/30/2022 11:46 AM EDT ----- [place] to call this patient several times a day did not get a chance to try to reach this patient and let her know all her labs are within range her cholesterol is mildly elevated and she would benefit from starting a cholesterol pill with her history of stroke.  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

## 2022-05-31 NOTE — Telephone Encounter (Signed)
Called patient and verified DOB, Patient stated that someone from primary care at [facility] had already given her results

## 2022-06-04 ENCOUNTER — Ambulatory Visit (INDEPENDENT_AMBULATORY_CARE_PROVIDER_SITE_OTHER): Payer: Self-pay

## 2022-06-04 ENCOUNTER — Encounter: Payer: Self-pay | Admitting: Orthopaedic Surgery

## 2022-06-04 ENCOUNTER — Ambulatory Visit (INDEPENDENT_AMBULATORY_CARE_PROVIDER_SITE_OTHER): Payer: Self-pay | Admitting: Orthopaedic Surgery

## 2022-06-04 VITALS — Ht 65.0 in | Wt 144.8 lb

## 2022-06-04 DIAGNOSIS — M545 Low back pain, unspecified: Secondary | ICD-10-CM

## 2022-06-04 MED ORDER — DICLOFENAC SODIUM 75 MG PO TBEC: 75.0000 mg | DELAYED_RELEASE_TABLET | Freq: Two times a day (BID) | ORAL | 2 refills | Status: DC | PRN

## 2022-06-04 NOTE — Progress Notes (Unsigned)
Office Visit Note   Patient: Debbie Howard           Date of Birth: 26-Nov-1960           MRN: [id number] Visit Date: 06/04/2022              Requested by: Elsie Stain, MD [address],  [place] [id number] PCP: Camillia Herter, NP   Assessment & Plan: Visit Diagnoses:  1. Low back pain, unspecified back pain laterality, unspecified chronicity, unspecified whether sciatica present     Plan: Impression is right lower extremity lumbar radiculopathy.  At this point, I have discussed starting the patient on a steroid but she notes that she developed swelling from this.  We will try an anti-inflammatory instead.  She will pick up the Flexeril that was prescribed to her recently.  I have also provided her with a lumbar conditioning program.  If her symptoms do not improve over the next 1 to 2 months or worsen in the meantime she will let us know.  She will call with concerns or questions in the meantime.  Follow-Up Instructions: Return if symptoms worsen or fail to improve.   Orders:  Orders Placed This Encounter  Procedures   XR Lumbar Spine 2-3 Views   Meds ordered this encounter  Medications   diclofenac (VOLTAREN) 75 MG EC tablet    Sig: Take 1 tablet (75 mg total) by mouth 2 (two) times daily as needed.    Dispense:  60 tablet    Refill:  2      Procedures: No procedures performed   Clinical Data: No additional findings.   Subjective: Chief Complaint  Patient presents with   Right Hip - Pain    HPI patient is a pleasant 61 year old female who comes in today with right buttock pain for the past 3 weeks.  She denies any injury or change in activity.  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  She does note that the right buttock pain occasionally radiates to the top of the thigh.  She denies any pain in the groin or pain distal to the knee.  Her symptoms appear to be worse when  she is stressed out.  No other specific aggravators.  She was prescribed Flexeril by her primary care provider but has not yet picked this up.  She denies any paresthesias to either lower extremity.  No bowel or bladder change or saddle paresthesias.  No previous lumbar or hip pathology.  Review of Systems as detailed in HPI.  All others reviewed and are negative.   Objective: Vital Signs: Ht '5\' 5"'$  (1.651 m)   Wt 144 lb 12.8 oz (65.7 kg)   BMI 24.10 kg/m   Physical Exam well-developed well-nourished female in no acute distress.  Alert and oriented x 3.  Ortho Exam lumbar spine exam reveals no spinous or paraspinous tenderness.  She does have a positive straight leg raise on the right.  Increased pain with lumbar flexion.  Negative logroll negative FADIR.  No focal weakness.  She is neurovascular tact distally.  Specialty Comments:  No specialty comments available.  Imaging: XR Lumbar Spine 2-3 Views  Result Date: 06/04/2022 X-rays demonstrate moderate degenerative changes L3-4 and L4-5    PMFS History: Patient Active Problem List   Diagnosis Date Noted   Acute pain of right hip 05/29/2022   Primary hypertension 05/29/2022  Mixed hyperlipidemia 05/29/2022   Encounter for screening mammogram for malignant neoplasm of breast 05/29/2022   Encounter for health-related screening 05/29/2022   History of stroke 03/14/2008   Past Medical History:  Diagnosis Date   CVA (cerebral infarction) 2009   Right frontal lobe precentral gyrus infarct   Hypertension    Meningioma ([place]) 2009    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    Panic attacks    SAB (spontaneous abortion)    Stroke ([place])    Transient ischemic attack 2009   x 3    Family History  Problem Relation Age of Onset   Stroke Mother    Hypertension Mother    Diabetes Brother    Stroke Brother    Heart failure Father     Past Surgical History:  Procedure Laterality Date   BREAST SURGERY     Social History    Occupational History   Not on file  Tobacco Use   Smoking status: Never   Smokeless tobacco: Never  Vaping Use   Vaping Use: Never used  Substance and Sexual Activity   Alcohol use: No   Drug use: No   Sexual activity: Not Currently

## 2022-06-20 ENCOUNTER — Telehealth: Payer: Self-pay | Admitting: Orthopaedic Surgery

## 2022-06-26 ENCOUNTER — Telehealth: Payer: Self-pay | Admitting: Orthopaedic Surgery

## 2022-06-26 NOTE — Telephone Encounter (Signed)
Received call from [place]. I advised nothing in chart indicates patient reaching out to [place] for the xray as patient told them she is having problems with our office. Please copy 06/05/22 spine xray on CD. Raynelle Chary will contact patient to pickup the CD before her appt tomorrow am. Thank you!

## 2022-06-26 NOTE — Telephone Encounter (Signed)
CD is ready at front desk for patient to pickup.

## 2022-07-02 NOTE — Progress Notes (Signed)
Erroneous encounter-disregard

## 2022-07-10 ENCOUNTER — Encounter: Payer: Self-pay | Admitting: Family

## 2022-07-10 DIAGNOSIS — I10 Essential (primary) hypertension: Secondary | ICD-10-CM

## 2022-09-04 ENCOUNTER — Telehealth: Payer: Self-pay | Admitting: Emergency Medicine

## 2022-09-04 NOTE — Telephone Encounter (Signed)
Copied from [place] [phone]. Topic: General - Other >> Sep 04, 2022 12:33 PM Sabas Sous wrote: Reason for CRM: Pt called to report that she dropped off paperwork for Dr. Joya Gaskins to complete, she wants a call back to discuss how to get this completed since her PCP is [facility].  Best contact: [phone]

## 2022-09-06 NOTE — Telephone Encounter (Signed)
Called pt and left vm   Pt needs a visit (face-to-face) with [facility]

## 2022-09-09 NOTE — Telephone Encounter (Signed)
Pt called back stating that she needs an OV urgently to have her FMLA paperwork completed. Says she is willing to see anyone since Dr. Joya Gaskins is out on Medical leave  Best contact: [phone]

## 2022-09-10 NOTE — Telephone Encounter (Signed)
It looks like I have an opening this Friday at 1110.  You could schedule her in that time slot.  Thank you

## 2022-09-11 NOTE — Telephone Encounter (Signed)
Attempt to call patient to give appointment with Dr. Margarita Rana on Friday. Left message on voicemail.

## 2022-09-13 NOTE — Telephone Encounter (Addendum)
Patient states dropped off a yellow folder here in the office 2 weeks ago.  Folder contained papers for RTW if able.

## 2022-09-16 NOTE — Telephone Encounter (Signed)
Patient has telephone visit tomorrow with [place] and paperwork was given to her as well

## 2022-09-17 ENCOUNTER — Ambulatory Visit (HOSPITAL_BASED_OUTPATIENT_CLINIC_OR_DEPARTMENT_OTHER): Payer: Self-pay | Admitting: Family Medicine

## 2022-09-17 ENCOUNTER — Encounter: Payer: Self-pay | Admitting: Family Medicine

## 2022-09-17 DIAGNOSIS — Z029 Encounter for administrative examinations, unspecified: Secondary | ICD-10-CM

## 2022-09-17 DIAGNOSIS — M545 Low back pain, unspecified: Secondary | ICD-10-CM

## 2022-09-17 DIAGNOSIS — M25551 Pain in right hip: Secondary | ICD-10-CM

## 2022-09-17 NOTE — Progress Notes (Signed)
Virtual Visit via Telephone Note  I connected with Debbie Howard, on 09/17/2022 at 8:17 AM by telephone and verified that I am speaking with the correct person using two identifiers.   Consent: I discussed the limitations, risks, security and privacy concerns of performing an evaluation and management service by telephone and the availability of in person appointments. I also discussed with the patient that there may be a patient responsible charge related to this service. The patient expressed understanding and agreed to proceed.   Location of Patient: Home  Location of Provider: Clinic   Persons participating in Telemedicine visit: Debbie Howard Dr. Margarita Rana     History of Present Illness: Debbie ABRUZZESE is a 61 y.o. year old female patient of Dr. Joya Gaskins with a history of hypertension, hyperlipidemia low back pain, right hip pain seen for an administrative visit today.   She works at [employer] and they require her to lift heavy boxes frequently.  She is requesting accommodations where she can sit at her job when she would like to avoid lifting completely.  Hip pain and back pain also exacerbated by prolonged standing, bending. She is currently under orthopedic care for her low back pain. 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    Panic attacks    SAB (spontaneous abortion)    Stroke ([place])    Transient ischemic attack 2009   x 3   Allergies  Allergen Reactions   Fish Allergy Hives   Famotidine Swelling    REACTION: (per pt)   Prednisone Hives and Swelling   Shellfish Allergy Hives   Iodine Hives and Rash    Current Outpatient Medications on File Prior to Visit  Medication Sig Dispense Refill   aspirin EC 81 MG tablet Take 1 tablet (81 mg total) by mouth daily. Swallow whole. 30 tablet 12    atorvastatin (LIPITOR) 20 MG tablet Take 1 tablet (20 mg total) by mouth daily. 90 tablet 3   cyclobenzaprine (FLEXERIL) 5 MG tablet Take 1 tablet (5 mg total) by mouth 3 (three) times daily as needed for muscle spasms. 60 tablet 0   diclofenac (VOLTAREN) 75 MG EC tablet Take 1 tablet (75 mg total) by mouth 2 (two) times daily as needed. 60 tablet 2   diclofenac Sodium (VOLTAREN) 1 % GEL Apply 4 g topically 4 (four) times daily. 50 g 2   lidocaine (LIDODERM) 5 % Place 1 patch onto the skin daily. Remove & Discard patch within 12 hours or as directed by MD 30 patch 0   losartan (COZAAR) 50 MG tablet Take 1 tablet (50 mg total) by mouth daily. 90 tablet 2   Multiple Vitamin (MULTIVITAMIN) tablet Take 1 tablet by mouth daily.     [DISCONTINUED] diphenhydrAMINE (BENADRYL) 25 MG tablet Take 1 tablet (25 mg total) by mouth every 6 (six) hours as needed. 30 tablet 0   [DISCONTINUED] FLUoxetine (PROZAC) 10 MG tablet Take 1 tablet (10 mg total) by mouth daily. 30 tablet 3   [DISCONTINUED] fluticasone (FLONASE) 50 MCG/ACT nasal spray Place 1 spray into both nostrils daily. (Patient not taking: No sig reported) 15.8 mL 2   No current facility-administered medications on file prior to visit.    ROS: See HPI  Observations/Objective: Awake, alert, oriented x3 Not in acute distress Normal mood  Latest Ref Rng & Units 05/29/2022    9:34 AM 05/01/2022    1:26 PM 05/20/2020   10:03 PM  CMP  Glucose 70 - 99 mg/dL 99  91  98   BUN 8 - 27 mg/dL '12  15  12   '$ Creatinine 0.57 - 1.00 mg/dL 0.74  0.74  0.77   Sodium 134 - 144 mmol/L 142  142  132   Potassium 3.5 - 5.2 mmol/L 4.1  3.7  3.6   Chloride 96 - 106 mmol/L 103  107  97   CO2 20 - 29 mmol/L '25  24  24   '$ Calcium 8.7 - 10.3 mg/dL 9.5  9.4  9.1   Total Protein 6.0 - 8.5 g/dL 7.4   7.7   Total Bilirubin 0.0 - 1.2 mg/dL 0.3   0.7   Alkaline Phos 44 - 121 IU/L 84   54   AST 0 - 40 IU/L 14   18   ALT 0 - 32 IU/L 15   22     Lipid Panel      Component Value Date/Time   CHOL 198 05/29/2022 0934   TRIG 49 05/29/2022 0934   HDL 68 05/29/2022 0934   CHOLHDL 2.9 05/29/2022 0934   CHOLHDL 3.6 Ratio 11/27/2009 2018   VLDL 16 11/27/2009 2018   LDLCALC 121 (H) 05/29/2022 0934   LABVLDL 9 05/29/2022 0934    Lab Results  Component Value Date   HGBA1C 5.6 05/29/2022    Assessment and Plan: 1. Chronic midline low back pain, unspecified whether sciatica present Secondary to right lower extremity lumbar radiculopathy Uncontrolled Currently on NSAIDs and followed by orthopedic Completed form for accommodation and faxed to [place]   2. Acute pain of right hip See #1 above  3.  Encounter for administrative purpose Completed form for [employer]   Follow Up Instructions: Follow-up with PCP   I discussed the assessment and treatment plan with the patient. The patient was provided an opportunity to ask questions and all were answered. The patient agreed with the plan and demonstrated an understanding of the instructions.   The patient was advised to call back or seek an in-person evaluation if the symptoms worsen or if the condition fails to improve as anticipated.     I provided 11 minutes total of non-face-to-face time during this encounter.   Charlott Rakes, MD, FAAFP. [facility], [place]   09/17/2022, 8:17 AM

## 2022-09-18 ENCOUNTER — Telehealth: Payer: Self-pay

## 2022-09-18 NOTE — Telephone Encounter (Signed)
Called patient and left vm letting her know that her paperwork has been faxed

## 2022-10-02 ENCOUNTER — Other Ambulatory Visit: Payer: Self-pay

## 2022-10-02 ENCOUNTER — Encounter (HOSPITAL_COMMUNITY): Payer: Self-pay | Admitting: Emergency Medicine

## 2022-10-02 ENCOUNTER — Ambulatory Visit (HOSPITAL_COMMUNITY)
Admission: EM | Admit: 2022-10-02 | Discharge: 2022-10-02 | Disposition: A | Discharge status: Home / Self Care | Payer: No Typology Code available for payment source | Attending: Family Medicine | Admitting: Family Medicine

## 2022-10-02 DIAGNOSIS — R051 Acute cough: Secondary | ICD-10-CM

## 2022-10-02 DIAGNOSIS — K0889 Other specified disorders of teeth and supporting structures: Secondary | ICD-10-CM

## 2022-10-02 MED ORDER — AMOXICILLIN 875 MG PO TABS: 875.0000 mg | ORAL_TABLET | Freq: Two times a day (BID) | ORAL | 0 refills | Status: AC

## 2022-10-02 NOTE — ED Triage Notes (Addendum)
Upper, left tooth pain .  This episode of pain has been less than a month.    Patient has a cough.  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  Patient has started coughing and sneezing since then.  Patient thinks she needs an antibiotic.

## 2022-10-03 NOTE — ED Provider Notes (Signed)
[facility]   [id number] 10/02/22 Arrival Time: 8588  ASSESSMENT & PLAN:  1. Pain, dental   2. Acute cough    No sign of abscess requiring I&D at this time. Discussed. Cough possibly early viral infection or response to Clorox.  Meds ordered this encounter  Medications   amoxicillin (AMOXIL) 875 MG tablet    Sig: Take 1 tablet (875 mg total) by mouth 2 (two) times daily for 7 days.    Dispense:  14 tablet    Refill:  0   Has dentist if needed.  Reviewed expectations re: course of current medical issues. Questions answered. Outlined signs and symptoms indicating need for more acute intervention. Patient verbalized understanding. After Visit Summary given.   SUBJECTIVE:  Debbie Howard is a 61 y.o. female who reports upper, left tooth pain. This episode of pain has been less than a month. Has seen dentist. Cannot afford definitive tx. Patient also has a cough.  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  Patient has started coughing and sneezing since then.  Patient thinks she needs an antibiotic.     OBJECTIVE: Vitals:   10/02/22 1902  BP: (!) 164/83  Pulse: 70  Resp: 16  Temp: 98.3 F (36.8 C)  TempSrc: Oral  SpO2: 97%    General appearance: alert; no distress HENT: normocephalic; atraumatic; dentition: fair; left upper gum without areas of fluctuance, drainage, or bleeding and with tenderness to palpation and with loose molar tooth; normal jaw movement without difficulty Neck: supple without LAD; FROM; trachea midline Lungs: normal respirations; unlabored; speaks full sentences without difficulty; dry cough; no wheezing Skin: warm and dry Psychological: alert and cooperative; normal mood and affect  Allergies  Allergen Reactions   Fish Allergy Hives   Famotidine Swelling    REACTION: (per pt)   Prednisone Hives and Swelling   Shellfish Allergy Hives   Iodine Hives and Rash    Past Medical History:  Diagnosis Date   CVA  (cerebral infarction) 2009   Right frontal lobe precentral gyrus infarct   Hypertension    Meningioma ([place]) 2009    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    Panic attacks    SAB (spontaneous abortion)    Stroke ([place])    Transient ischemic attack 2009   x 3   Social History   Socioeconomic History   Marital status: Single    Spouse name: Not on file   Number of children: Not on file   Years of education: Not on file   Highest education level: Not on file  Occupational History   Not on file  Tobacco Use   Smoking status: Never   Smokeless tobacco: Never  Vaping Use   Vaping Use: Never used  Substance and Sexual Activity   Alcohol use: No   Drug use: No   Sexual activity: Not Currently  Other Topics Concern   Not on file  Social History Narrative   Pt works at a super Network engineer   Social Determinants of Radio broadcast assistant Strain: Not on file  Food Insecurity: Not on file  Transportation Needs: Not on file  Physical Activity: Not on file  Stress: Not on file  Social Connections: Not on file  Intimate Partner Violence: Not on file   Family History  Problem Relation Age of Onset   Stroke Mother    Hypertension Mother    Heart failure Father    Diabetes Brother    Stroke  Brother    Past Surgical History:  Procedure Laterality Date   BREAST SURGERY        Vanessa Kick, MD 10/03/22 1030

## 2022-10-08 ENCOUNTER — Telehealth: Payer: Self-pay | Admitting: Emergency Medicine

## 2022-10-08 NOTE — Telephone Encounter (Signed)
Copied from [place] [id number]. Topic: General - Inquiry >> Oct 08, 2022  3:06 PM Devoria Glassing wrote: Reason for CRM: Caryl Ada from [employer] calling back b/c the accomodation letter was not filled out completely.  Caryl Ada is going to fax form with the information they need. FYI:  pt is a pt w/ Amy Stephens. She saw Dr Margarita Rana, however she is still a pt w/ Amy.

## 2022-10-09 ENCOUNTER — Emergency Department (HOSPITAL_COMMUNITY)
Admission: EM | Admit: 2022-10-09 | Discharge: 2022-10-09 | Disposition: A | Discharge status: Home / Self Care | Payer: Worker's Compensation | Attending: Emergency Medicine | Admitting: Emergency Medicine

## 2022-10-09 ENCOUNTER — Encounter (HOSPITAL_COMMUNITY): Payer: Self-pay

## 2022-10-09 DIAGNOSIS — R519 Headache, unspecified: Secondary | ICD-10-CM | POA: Insufficient documentation

## 2022-10-09 DIAGNOSIS — Z7982 Long term (current) use of aspirin: Secondary | ICD-10-CM | POA: Diagnosis not present

## 2022-10-09 DIAGNOSIS — F410 Panic disorder [episodic paroxysmal anxiety] without agoraphobia: Secondary | ICD-10-CM | POA: Diagnosis present
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
## 2022-10-09 NOTE — Telephone Encounter (Signed)
CMA will be on the look out for the paperwork.

## 2022-10-22 NOTE — Telephone Encounter (Signed)
Caryl Ada w/ [employer] calling b/c she has not seen this paperwork as of yet.  Please call back w questions if you need help w/ this. [phone]

## 2022-10-24 ENCOUNTER — Emergency Department (HOSPITAL_COMMUNITY): Payer: No Typology Code available for payment source

## 2022-10-24 ENCOUNTER — Encounter (HOSPITAL_COMMUNITY): Payer: Self-pay

## 2022-10-24 ENCOUNTER — Emergency Department (HOSPITAL_COMMUNITY)
Admission: EM | Admit: 2022-10-24 | Discharge: 2022-10-24 | Disposition: A | Discharge status: Home / Self Care | Payer: No Typology Code available for payment source | Attending: Emergency Medicine | Admitting: Emergency Medicine

## 2022-10-24 ENCOUNTER — Other Ambulatory Visit: Payer: Self-pay

## 2022-10-24 DIAGNOSIS — M545 Low back pain, unspecified: Secondary | ICD-10-CM | POA: Insufficient documentation

## 2022-10-24 DIAGNOSIS — M25551 Pain in right hip: Secondary | ICD-10-CM | POA: Diagnosis not present

## 2022-10-24 DIAGNOSIS — Y92410 Unspecified street and highway as the place of occurrence of the external cause: Secondary | ICD-10-CM | POA: Diagnosis not present

## 2022-10-24 DIAGNOSIS — Z7982 Long term (current) use of aspirin: Secondary | ICD-10-CM | POA: Diagnosis not present

## 2022-10-24 NOTE — Discharge Instructions (Signed)
You will be sore in the coming days.  Please take Tylenol or ibuprofen for pain control.  I would like for you to follow-up with your primary care doctor within the next week to ensure you are feeling better.  As we discussed, your imaging was normal.  Please return to the emergency room for any worsening symptoms you might have.
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
## 2022-10-31 ENCOUNTER — Ambulatory Visit (HOSPITAL_COMMUNITY): Payer: No Typology Code available for payment source

## 2022-10-31 NOTE — Telephone Encounter (Signed)
Do either of you all recall this paperwork being given to you from Dr.Newlin on my absent back in November

## 2022-10-31 NOTE — Telephone Encounter (Signed)
No

## 2022-10-31 NOTE — Telephone Encounter (Signed)
Called patient and made appointment with [facility]

## 2022-11-01 ENCOUNTER — Encounter (HOSPITAL_COMMUNITY): Payer: Self-pay

## 2022-11-01 ENCOUNTER — Ambulatory Visit (HOSPITAL_COMMUNITY)
Admission: RE | Admit: 2022-11-01 | Discharge: 2022-11-01 | Disposition: A | Discharge status: Home / Self Care | Payer: BLUE CROSS/BLUE SHIELD | Source: Ambulatory Visit | Attending: Emergency Medicine | Admitting: Emergency Medicine

## 2022-11-01 VITALS — BP 157/78 | HR 83 | Temp 98.3°F | Resp 18

## 2022-11-01 DIAGNOSIS — S39012D Strain of muscle, fascia and tendon of lower back, subsequent encounter: Secondary | ICD-10-CM | POA: Diagnosis not present

## 2022-11-01 DIAGNOSIS — M7918 Myalgia, other site: Secondary | ICD-10-CM

## 2022-11-01 MED ORDER — IBUPROFEN 600 MG PO TABS: 600.0000 mg | ORAL_TABLET | Freq: Four times a day (QID) | ORAL | 0 refills | Status: DC | PRN

## 2022-11-01 MED ORDER — CYCLOBENZAPRINE HCL 10 MG PO TABS: 10.0000 mg | ORAL_TABLET | Freq: Three times a day (TID) | ORAL | 0 refills | Status: DC | PRN
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
## 2022-11-01 NOTE — Discharge Instructions (Addendum)
People tend to feel worse over the next several days, but most people are back to normal in 1 week. A small number of people will have persistent pain for up to six weeks. Take the 600 mg of ibuprofen combined with 1000 mg of tylenol 3 times a day.  Flexeril for muscle spasms.  Epsom salt soaks, heat, gentle massage and stretching can be helpful. Below is a list of primary care practices who are taking new patients for you to follow-up with.  [facility] -multiple locations.  See website at [email or web address]  [facility] internal medicine clinic Ground Floor - [facility], [place], [place], [place] [address] [phone]  [facility] Primary [facility] at [facility] [address] [place], [place] [address] [phone]  [place]- will see patients with no insurance [address] [place], [place] [address] [place], [place] [address] [phone]  [facility]Internal Medicine [phone] [place] [address]  [facility]: [place]  [phone]  [facility]: [address] [place] [place]  [phone]  [facility] : [address] [place] [phone]  [facility]: [address] [place]  [phone]  [facility]: [place] [phone]  Dr. Blanchie Serve [address] [place]  [facility]- will see patients with no insurance. [address], [place], [place] [address] [phone]  Go to www.goodrx.com  or www.costplusdrugs.com to look up your medications. This will give you a list of where you can find your prescriptions at the most affordable prices. Or ask the pharmacist what the cash  price is, or if they have any other discount programs available to help make your medication more affordable. This can be less expensive than what you would pay with insurance.
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
## 2022-11-03 NOTE — Progress Notes (Signed)
Established Patient Office Visit  Subjective   Patient ID: Debbie Howard, female    DOB: 08-17-1961  Age: 62 y.o. MRN: [id number] Virtual Visit via Telephone Note  I connected with Debbie Howard on 11/05/22 at  8:30 AM EST by telephone and verified that I am speaking with the correct person using two identifiers.   Consent:  I discussed the limitations, risks, security and privacy concerns of performing an evaluation and management service by telephone and the availability of in person appointments. I also discussed with the patient that there may be a patient responsible charge related to this service. The patient expressed understanding and agreed to proceed.  Location of patient: Patient is at home  Location of provider: I am in my office  Persons participating in the televisit with the patient.    No one else on the call   Job-related paperwork is chief complaint  05/29/22 This is a 62 year old female seen today as a work in visit for right hip pain ongoing for 3 weeks.  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  The patient noted the acute onset of right-sided hip pain 1 day while doing this type of work.  It is rated at on a scale out of 10 level 6.  She has pain awaking her from sleep and in the right hip.  She had a prior history of back problems.  Patient states cyclobenzaprine helps with the back pain and also has been helping with the right hip pain she would like refills.  Patient states that ibuprofen and nonsteroidals cause abdominal pain.  She did have a stroke in 2009 and was supposed to be on aspirin 3 and 25 mg but cannot tolerate this but also because of abdominal pain and GI upset.  Note patient is due a mammogram and also had a colonoscopy within the last 3 years which was negative.  Patient needs an FMLA paperwork filled out as  she cannot perform this job function and needs to have the hip pain addressed.  Note the patient's primary care provider is Amy Gerilyn Nestle she has been followed at [facility] previously wishes to return to that practice.  Patient needs refills on her losartan she does have hypertension on arrival blood pressure is 123/80  11/05/22 Patient is seen today by way of a phone visit.  She needs paperwork filled out for her job at both [employer].  She notes that she is unable to lift any object and cannot bend over as well due to back pain and neck pain.  She can stand for 20 to 25 minutes and then has to sit down.  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  At [facility] she is washing dishes and is having difficulty with this as well.  Note she has elevated blood pressure readings at home in the 409-811 systolic range.  She is only on low-dose losartan at this time.  Patient also has significant anxiety and stress.  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    Panic attacks    SAB (spontaneous abortion)    Stroke ([place])    Transient ischemic attack 2009   x 3   Past Surgical History:  Procedure Laterality Date   BREAST SURGERY     Social  History   Tobacco Use   Smoking status: Never   Smokeless tobacco: Never  Vaping Use   Vaping Use: Never used  Substance Use Topics   Alcohol use: No   Drug use: No   Social History   Socioeconomic History   Marital status: Single    Spouse name: Not on file   Number of children: Not on file   Years of education: Not on file   Highest education level: Not on file  Occupational History   Not on file  Tobacco Use   Smoking status: Never   Smokeless tobacco: Never  Vaping Use   Vaping Use: Never used  Substance and Sexual Activity   Alcohol use: No   Drug use: No   Sexual activity: Not Currently  Other Topics Concern   Not on file  Social History Narrative   Pt works at a super Network engineer   Social Determinants of Radio broadcast assistant Strain: Not on file  Food Insecurity: Not on file  Transportation Needs: Not on file  Physical Activity: Not on file  Stress: Not on file  Social Connections: Not on file  Intimate Partner Violence: Not on file   Family Status  Relation Name Status   Mother  Deceased   Father  Deceased   Brother  (Not Specified)   Family History  Problem Relation Age of Onset   Stroke Mother    Hypertension Mother    Heart failure Father    Diabetes Brother    Stroke Brother    Allergies  Allergen Reactions   Fish Allergy Hives   Famotidine Swelling    REACTION: (per pt)   Prednisone Hives and Swelling   Shellfish Allergy Hives   Iodine Hives and Rash      Review of Systems  Constitutional:  Negative for chills, diaphoresis, fever, malaise/fatigue and weight loss.  HENT:  Negative for congestion, ear discharge, ear pain, hearing loss, nosebleeds, sore throat and tinnitus.   Eyes:  Negative for blurred vision, double vision, photophobia and discharge.  Respiratory:  Negative for cough, hemoptysis, sputum production, shortness of breath, wheezing and stridor.        No excess mucus  Cardiovascular:  Negative for chest pain,  palpitations, orthopnea, claudication, leg swelling and PND.  Gastrointestinal:  Negative for abdominal pain, blood in stool, constipation, diarrhea, heartburn, melena, nausea and vomiting.  Genitourinary:  Negative for dysuria, flank pain, frequency, hematuria and urgency.  Musculoskeletal:  Positive for back pain and neck pain. Negative for falls, joint pain and myalgias.       Right-sided hip pain  Skin:  Negative for itching and rash.  Neurological:  Negative for dizziness, tingling, tremors, sensory change, speech change, focal weakness, seizures, loss of consciousness, weakness and headaches.  Endo/Heme/Allergies:  Negative for environmental allergies and polydipsia. Does not bruise/bleed easily.  Psychiatric/Behavioral:  Negative for depression, hallucinations, memory loss, substance abuse and suicidal ideas. The patient is nervous/anxious. The patient does not have insomnia.   All other systems reviewed and are negative.     Objective:    No exam this is a phone visit   There were no vitals taken for this visit. BP Readings from Last 3 Encounters:  11/01/22 (!) 157/78  10/24/22 130/88  10/09/22 132/77   Wt Readings from Last 3 Encounters:  10/24/22 145 lb (65.8 kg)  06/04/22 144 lb 12.8 oz (65.7 kg)  05/29/22 144 lb 12.8 oz (65.7 kg)         No results found for any visits on 11/05/22.  Last CBC Lab Results  Component Value Date   WBC 3.7 05/29/2022   HGB 13.1 05/29/2022   HCT 39.1 05/29/2022   MCV 87 05/29/2022   MCH 29.2 05/29/2022   RDW 12.2 05/29/2022   PLT 284 60/45/4098   Last metabolic panel Lab Results  Component Value Date   GLUCOSE 99 05/29/2022   NA 142 05/29/2022   K 4.1 05/29/2022   CL 103 05/29/2022   CO2 25 05/29/2022   BUN 12 05/29/2022   CREATININE 0.74 05/29/2022   GFRNONAA >60 05/01/2022   CALCIUM 9.5 05/29/2022   PROT 7.4 05/29/2022   ALBUMIN 4.3 05/29/2022   LABGLOB 3.1 05/29/2022   AGRATIO 1.4 05/29/2022   BILITOT 0.3  05/29/2022   ALKPHOS 84 05/29/2022   AST 14 05/29/2022   ALT 15 05/29/2022   ANIONGAP 11 05/01/2022   Last lipids Lab Results  Component Value Date   CHOL 198 05/29/2022   HDL 68 05/29/2022   LDLCALC 121 (H) 05/29/2022   TRIG 49 05/29/2022   CHOLHDL 2.9 05/29/2022   Last hemoglobin A1c Lab Results  Component Value Date   HGBA1C 5.6 05/29/2022   Last thyroid functions Lab Results  Component Value Date   TSH 2.452 04/11/2017   Last vitamin D No results found for: "25OHVITD2", "25OHVITD3", "VD25OH"    The 10-year ASCVD risk score (Arnett DK, et al., 2019) is: 12.5%    Assessment & Plan:   Problem List Items Addressed This Visit       Cardiovascular and Mediastinum   Primary hypertension - Primary    Primary hypertension at this time not well-controlled based on home readings  Plan to change losartan to valsartan 320 mg daily and get patient back in short-term follow-up with clinical pharmacy and Dr. Joya Gaskins      Relevant Medications   valsartan (DIOVAN) 320 MG tablet   atorvastatin (LIPITOR) 20 MG tablet     Other   History of stroke    As per hyperlipidemia      Acute pain of right hip    Patient has follow-up with orthopedics and knows a Flexeril and ibuprofen was sent at her January 5 urgent care visit      Mixed hyperlipidemia    Plan to renew atorvastatin daily and continue aspirin      Relevant Medications   valsartan (DIOVAN) 320 MG tablet   atorvastatin (LIPITOR) 20 MG tablet   Encounter for screening mammogram for malignant neoplasm of breast    I ordered a mammogram but patient declines to receive mammogram colon cancer screening and Pap smear screening she states that it is against her religion and she wishes to hold off on the studies  Administrative encounter    Administrative encounter occurred paperwork filled out for patient letter was produced       Follow Up Instructions: Patient knows follow-up office visit will be made    I discussed the assessment and treatment plan with the patient. The patient was provided an opportunity to ask questions and all were answered. The patient agreed with the plan and demonstrated an understanding of the instructions.   The patient was advised to call back or seek an in-person evaluation if the symptoms worsen or if the condition fails to improve as anticipated.  I provided 30 minutes of non-face-to-face time during this encounter  including  median intraservice time , review of notes, labs, imaging, medications  and explaining diagnosis and management to the patient .    Asencion Noble, MD Return in about 1 month (around 12/06/2022) for htn.    Asencion Noble, MD

## 2022-11-05 ENCOUNTER — Encounter: Payer: Self-pay | Admitting: Critical Care Medicine

## 2022-11-05 ENCOUNTER — Ambulatory Visit: Payer: BLUE CROSS/BLUE SHIELD | Attending: Critical Care Medicine | Admitting: Critical Care Medicine

## 2022-11-05 ENCOUNTER — Telehealth: Payer: Self-pay | Admitting: Critical Care Medicine

## 2022-11-05 DIAGNOSIS — M25551 Pain in right hip: Secondary | ICD-10-CM | POA: Diagnosis not present

## 2022-11-05 DIAGNOSIS — I10 Essential (primary) hypertension: Secondary | ICD-10-CM

## 2022-11-05 DIAGNOSIS — Z029 Encounter for administrative examinations, unspecified: Secondary | ICD-10-CM

## 2022-11-05 DIAGNOSIS — Z8673 Personal history of transient ischemic attack (TIA), and cerebral infarction without residual deficits: Secondary | ICD-10-CM

## 2022-11-05 DIAGNOSIS — E782 Mixed hyperlipidemia: Secondary | ICD-10-CM | POA: Diagnosis not present

## 2022-11-05 DIAGNOSIS — Z1231 Encounter for screening mammogram for malignant neoplasm of breast: Secondary | ICD-10-CM

## 2022-11-05 MED ORDER — VALSARTAN 320 MG PO TABS: 320.0000 mg | ORAL_TABLET | Freq: Every day | ORAL | 3 refills | Status: DC

## 2022-11-05 MED ORDER — ATORVASTATIN CALCIUM 20 MG PO TABS: 20.0000 mg | ORAL_TABLET | Freq: Every day | ORAL | 3 refills | Status: DC

## 2022-11-05 NOTE — Assessment & Plan Note (Signed)
Primary hypertension at this time not well-controlled based on home readings  Plan to change losartan to valsartan 320 mg daily and get patient back in short-term follow-up with clinical pharmacy and Dr. Joya Gaskins

## 2022-11-05 NOTE — Telephone Encounter (Signed)
Copied from [place] [phone]. Topic: General - Other >> Nov 05, 2022  9:50 AM Sabas Sous wrote: Reason for CRM: Pt called to provide the fax number for human resources at [place]. AttnFara Olden  Fax: [phone]  She says that her PCP can fax over the form that she discussed with him this morning

## 2022-11-05 NOTE — Assessment & Plan Note (Signed)
Administrative encounter occurred paperwork filled out for patient letter was produced

## 2022-11-05 NOTE — Assessment & Plan Note (Signed)
Plan to renew atorvastatin daily and continue aspirin

## 2022-11-05 NOTE — Assessment & Plan Note (Signed)
I ordered a mammogram but patient declines to receive mammogram colon cancer screening and Pap smear screening she states that it is against her religion and she wishes to hold off on the studies

## 2022-11-05 NOTE — Progress Notes (Signed)
Letter for [employer]

## 2022-11-05 NOTE — Assessment & Plan Note (Signed)
Patient has follow-up with orthopedics and knows a Flexeril and ibuprofen was sent at her January 5 urgent care visit

## 2022-11-05 NOTE — Assessment & Plan Note (Signed)
As per hyperlipidemia

## 2022-11-06 ENCOUNTER — Telehealth: Payer: Self-pay | Admitting: Critical Care Medicine

## 2022-11-06 NOTE — Telephone Encounter (Signed)
Copied from [place] [phone]. Topic: General - Other >> Nov 06, 2022  1:39 PM Everette C wrote: Reason for CRM: The patient's HR representative has called regarding previously discussed paperwork related to the patient   Please contact Ms Alison Murray at [facility] [phone] regarding paperwork originally submitted to the practice on 10/09/22

## 2022-11-07 ENCOUNTER — Telehealth: Payer: Self-pay | Admitting: Emergency Medicine

## 2022-11-07 NOTE — Telephone Encounter (Signed)
Paperwork faxed °

## 2022-11-07 NOTE — Telephone Encounter (Signed)
Called patient ad left voicemail  pt has a letter and I am wanting to know how she wants it sent

## 2022-11-07 NOTE — Telephone Encounter (Signed)
Copied from [place] [phone]. Topic: General - Other >> Nov 07, 2022 11:14 AM Cyndi Bender wrote: Reason for CRM: Pt requests that the paperwork be faxed to [place] at [facility] at fax# [phone]

## 2022-11-07 NOTE — Telephone Encounter (Signed)
Noted  

## 2022-11-07 NOTE — Telephone Encounter (Signed)
Copied from [place] [phone]. Topic: General - Inquiry >> Nov 06, 2022  3:05 PM Marcellus Scott wrote: Reason for CRM:Pt stated she will call back with fax number so office can send her work note to her employer [employer]. >> Nov 06, 2022  5:10 PM Sabas Sous wrote: Fax: [phone]

## 2022-11-07 NOTE — Telephone Encounter (Signed)
Noted will fax in today

## 2022-11-11 ENCOUNTER — Telehealth: Payer: Self-pay

## 2022-11-11 NOTE — Telephone Encounter (Signed)
Tired faxing letter to job three times but keeps saying busy  Need another fax number

## 2022-11-12 ENCOUNTER — Telehealth: Payer: Self-pay

## 2022-11-12 NOTE — Telephone Encounter (Signed)
Called patient back from previous message need fax number

## 2022-11-22 ENCOUNTER — Telehealth: Payer: Self-pay

## 2022-11-22 NOTE — Telephone Encounter (Signed)
Called and left vm I will mail letter for her job to her

## 2022-12-03 ENCOUNTER — Telehealth: Payer: Self-pay | Admitting: Critical Care Medicine

## 2022-12-03 NOTE — Telephone Encounter (Signed)
Needs mammogram done was ordered last summer

## 2022-12-05 NOTE — Telephone Encounter (Signed)
Snapshot was printed and placed in folder for scheduling, patient will get a call for appointment

## 2022-12-08 NOTE — Progress Notes (Deleted)
Established Patient Office Visit  Subjective   Patient ID: Debbie Howard, female    DOB: December 19, 1960  Age: 62 y.o. MRN: [id number]  05/29/22 This is a 62 year old female seen today as a work in visit for right hip pain ongoing for 3 weeks.  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  The patient noted the acute onset of right-sided hip pain 1 day while doing this type of work.  It is rated at on a scale out of 10 level 6.  She has pain awaking her from sleep and in the right hip.  She had a prior history of back problems.  Patient states cyclobenzaprine helps with the back pain and also has been helping with the right hip pain she would like refills.  Patient states that ibuprofen and nonsteroidals cause abdominal pain.  She did have a stroke in 2009 and was supposed to be on aspirin 3 and 25 mg but cannot tolerate this but also because of abdominal pain and GI upset.  Note patient is due a mammogram and also had a colonoscopy within the last 3 years which was negative.  Patient needs an FMLA paperwork filled out as she cannot perform this job function and needs to have the hip pain addressed.  Note the patient's primary care provider is Amy Gerilyn Nestle she has been followed at [facility] previously wishes to return to that practice.  Patient needs refills on her losartan she does have hypertension on arrival blood pressure is 123/80  11/05/22 Patient is seen today by way of a phone visit.  She needs paperwork filled out for her job at both [employer].  She notes that she is unable to lift any object and cannot bend over as well due to back pain and neck pain.  She can stand for 20 to 25 minutes and then has to sit down.  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  At [facility] she is washing dishes and is having difficulty with this as well.  Note she has elevated blood pressure readings at home in the [id number] systolic range.  She is only on low-dose losartan at this time.  Patient also has significant anxiety and stress.  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 and has follow-up planned  12/10/22 Pap hcv fobt mammo HTN control     Patient Active Problem List   Diagnosis Date Noted   Administrative encounter 11/05/2022   Acute pain of right hip 05/29/2022   Primary hypertension 05/29/2022   Mixed hyperlipidemia 05/29/2022   Encounter for screening mammogram for malignant neoplasm of breast 05/29/2022   Encounter for health-related screening 05/29/2022   History of stroke 03/14/2008   Past Medical History:  Diagnosis Date   CVA (cerebral infarction) 2009   Right frontal lobe precentral gyrus infarct   Hypertension    Meningioma ([place]) 2009    Probable right anterior frontal meningioma.    Panic attacks    SAB (spontaneous abortion)    Stroke ([place])    Transient ischemic attack 2009   x 3   Past Surgical History:  Procedure Laterality Date   BREAST SURGERY     Social History  Tobacco Use   Smoking status: Never   Smokeless tobacco: Never  Vaping Use   Vaping Use: Never used  Substance Use Topics   Alcohol use: No   Drug use: No   Social History   Socioeconomic History   Marital status: Single    Spouse name: Not on file   Number of children: Not on file   Years of education: Not on file   Highest education level: Not on file  Occupational History   Not on file  Tobacco Use   Smoking status: Never   Smokeless tobacco: Never  Vaping Use   Vaping Use: Never used  Substance and Sexual Activity   Alcohol use: No   Drug use: No   Sexual activity: Not Currently  Other Topics Concern   Not on file  Social History Narrative   Pt works at a super Network engineer    Social Determinants of Radio broadcast assistant Strain: Not on file  Food Insecurity: Not on file  Transportation Needs: Not on file  Physical Activity: Not on file  Stress: Not on file  Social Connections: Not on file  Intimate Partner Violence: Not on file   Family Status  Relation Name Status   Mother  Deceased   Father  Deceased   Brother  (Not Specified)   Family History  Problem Relation Age of Onset   Stroke Mother    Hypertension Mother    Heart failure Father    Diabetes Brother    Stroke Brother    Allergies  Allergen Reactions   Fish Allergy Hives   Famotidine Swelling    REACTION: (per pt)   Prednisone Hives and Swelling   Shellfish Allergy Hives   Iodine Hives and Rash      Review of Systems  Constitutional:  Negative for chills, diaphoresis, fever, malaise/fatigue and weight loss.  HENT:  Negative for congestion, ear discharge, ear pain, hearing loss, nosebleeds, sore throat and tinnitus.   Eyes:  Negative for blurred vision, double vision, photophobia and discharge.  Respiratory:  Negative for cough, hemoptysis, sputum production, shortness of breath, wheezing and stridor.        No excess mucus  Cardiovascular:  Negative for chest pain, palpitations, orthopnea, claudication, leg swelling and PND.  Gastrointestinal:  Negative for abdominal pain, blood in stool, constipation, diarrhea, heartburn, melena, nausea and vomiting.  Genitourinary:  Negative for dysuria, flank pain, frequency, hematuria and urgency.  Musculoskeletal:  Positive for back pain and neck pain. Negative for falls, joint pain and myalgias.       Right-sided hip pain  Skin:  Negative for itching and rash.  Neurological:  Negative for dizziness, tingling, tremors, sensory change, speech change, focal weakness, seizures, loss of consciousness, weakness and headaches.  Endo/Heme/Allergies:  Negative for environmental allergies and polydipsia. Does not bruise/bleed easily.   Psychiatric/Behavioral:  Negative for depression, hallucinations, memory loss, substance abuse and suicidal ideas. The patient is nervous/anxious. The patient does not have insomnia.   All other systems reviewed and are negative.     Objective:    No exam this is a phone visit   There were no vitals taken for this visit. BP Readings from Last 3 Encounters:  11/01/22 (!) 157/78  10/24/22 130/88  10/09/22 132/77   Wt Readings from Last 3 Encounters:  10/24/22 145 lb (65.8 kg)  06/04/22 144 lb 12.8 oz (65.7 kg)  05/29/22 144 lb 12.8 oz (65.7 kg)  No results found for any visits on 12/10/22.  Last CBC Lab Results  Component Value Date   WBC 3.7 05/29/2022   HGB 13.1 05/29/2022   HCT 39.1 05/29/2022   MCV 87 05/29/2022   MCH 29.2 05/29/2022   RDW 12.2 05/29/2022   PLT 284 [id number]   Last metabolic panel Lab Results  Component Value Date   GLUCOSE 99 05/29/2022   NA 142 05/29/2022   K 4.1 05/29/2022   CL 103 05/29/2022   CO2 25 05/29/2022   BUN 12 05/29/2022   CREATININE 0.74 05/29/2022   GFRNONAA >60 05/01/2022   CALCIUM 9.5 05/29/2022   PROT 7.4 05/29/2022   ALBUMIN 4.3 05/29/2022   LABGLOB 3.1 05/29/2022   AGRATIO 1.4 05/29/2022   BILITOT 0.3 05/29/2022   ALKPHOS 84 05/29/2022   AST 14 05/29/2022   ALT 15 05/29/2022   ANIONGAP 11 05/01/2022   Last lipids Lab Results  Component Value Date   CHOL 198 05/29/2022   HDL 68 05/29/2022   LDLCALC 121 (H) 05/29/2022   TRIG 49 05/29/2022   CHOLHDL 2.9 05/29/2022   Last hemoglobin A1c Lab Results  Component Value Date   HGBA1C 5.6 05/29/2022   Last thyroid functions Lab Results  Component Value Date   TSH 2.452 04/11/2017   Last vitamin D No results found for: "25OHVITD2", "25OHVITD3", "VD25OH"    The 10-year ASCVD risk score (Arnett DK, et al., 2019) is: 12.5%    Assessment & Plan:   Problem List Items Addressed This Visit   None  Follow Up Instructions: Patient knows  follow-up office visit will be made   I discussed the assessment and treatment plan with the patient. The patient was provided an opportunity to ask questions and all were answered. The patient agreed with the plan and demonstrated an understanding of the instructions.   The patient was advised to call back or seek an in-person evaluation if the symptoms worsen or if the condition fails to improve as anticipated.  I provided 30 minutes of non-face-to-face time during this encounter  including  median intraservice time , review of notes, labs, imaging, medications  and explaining diagnosis and management to the patient .    Asencion Noble, MD No follow-ups on file.    Asencion Noble, MD

## 2022-12-10 ENCOUNTER — Ambulatory Visit: Payer: No Typology Code available for payment source | Admitting: Critical Care Medicine

## 2022-12-14 ENCOUNTER — Emergency Department (HOSPITAL_COMMUNITY): Payer: BLUE CROSS/BLUE SHIELD

## 2022-12-14 ENCOUNTER — Emergency Department (HOSPITAL_COMMUNITY)
Admission: EM | Admit: 2022-12-14 | Discharge: 2022-12-14 | Disposition: A | Discharge status: Home / Self Care | Payer: BLUE CROSS/BLUE SHIELD | Attending: Emergency Medicine | Admitting: Emergency Medicine

## 2022-12-14 ENCOUNTER — Other Ambulatory Visit: Payer: Self-pay

## 2022-12-14 DIAGNOSIS — Y92410 Unspecified street and highway as the place of occurrence of the external cause: Secondary | ICD-10-CM | POA: Insufficient documentation

## 2022-12-14 DIAGNOSIS — I10 Essential (primary) hypertension: Secondary | ICD-10-CM | POA: Insufficient documentation

## 2022-12-14 DIAGNOSIS — M25562 Pain in left knee: Secondary | ICD-10-CM | POA: Diagnosis not present

## 2022-12-14 DIAGNOSIS — G8911 Acute pain due to trauma: Secondary | ICD-10-CM | POA: Insufficient documentation

## 2022-12-14 DIAGNOSIS — Z7982 Long term (current) use of aspirin: Secondary | ICD-10-CM | POA: Insufficient documentation

## 2022-12-14 DIAGNOSIS — Z79899 Other long term (current) drug therapy: Secondary | ICD-10-CM | POA: Insufficient documentation

## 2022-12-14 DIAGNOSIS — M25561 Pain in right knee: Secondary | ICD-10-CM | POA: Insufficient documentation
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
## 2022-12-18 ENCOUNTER — Telehealth: Payer: Self-pay | Admitting: *Deleted

## 2022-12-18 NOTE — Telephone Encounter (Signed)
Left message on voicemail to return call.  Schedule for a mammogram 3/1 or 3/11

## 2022-12-19 ENCOUNTER — Telehealth: Payer: Self-pay | Admitting: Critical Care Medicine

## 2022-12-19 NOTE — Telephone Encounter (Signed)
Copied from [place] [phone]. Topic: General - Inquiry >> Dec 19, 2022  4:33 PM Erskine Squibb wrote: Reason for CRM: The patient states she shouldn't be getting any phones calls concerning appts with the [facility]. She said she previously spoke with her provider about this. She said she doesn't appreciate this. She says in general a lot of things have taken place lately that have not been good and will talk with her provider next month about it.

## 2022-12-23 NOTE — Telephone Encounter (Signed)
Called patient and verified request and declined mammogram screening on health care gaps   FYI To [facility]

## 2022-12-27 ENCOUNTER — Telehealth: Payer: Self-pay

## 2022-12-27 NOTE — Telephone Encounter (Signed)
Copied from [place] [phone]. Topic: General - Other >> Dec 27, 2022  4:32 PM Sabas Sous wrote: Reason for CRM: Pt called requesting a reprint of the restriction letter for work. Please advise   Patient states that she was able to find the letter on her mychart.

## 2022-12-31 ENCOUNTER — Telehealth: Payer: Self-pay

## 2022-12-31 NOTE — Telephone Encounter (Signed)
Called patient unable to make contact or leave vm   Letter is ready for patient

## 2023-01-01 ENCOUNTER — Ambulatory Visit: Payer: No Typology Code available for payment source | Admitting: Physician Assistant

## 2023-01-01 ENCOUNTER — Telehealth: Payer: Self-pay

## 2023-01-01 NOTE — Telephone Encounter (Signed)
Called patient unable to make contact or leave voicemail   Letter is ready I will send letter through the mail

## 2023-01-03 ENCOUNTER — Telehealth: Payer: Self-pay | Admitting: Critical Care Medicine

## 2023-01-03 NOTE — Telephone Encounter (Signed)
Copied from [place] [phone]. Topic: General - Other >> Jan 03, 2023  2:23 PM Santiya F wrote: Reason for CRM: Benjamine Mola with [employer] is calling verifying if the bills and records request they sent over had been received. Benjamine Mola is requesting someone follow up with her to let her know if the form was or wasn't received-[phone] Ext [phone].

## 2023-01-06 ENCOUNTER — Other Ambulatory Visit: Payer: Self-pay

## 2023-01-06 ENCOUNTER — Encounter (HOSPITAL_COMMUNITY): Payer: Self-pay

## 2023-01-06 ENCOUNTER — Emergency Department (HOSPITAL_COMMUNITY): Payer: BLUE CROSS/BLUE SHIELD

## 2023-01-06 ENCOUNTER — Emergency Department (HOSPITAL_COMMUNITY)
Admission: EM | Admit: 2023-01-06 | Discharge: 2023-01-06 | Disposition: A | Discharge status: Home / Self Care | Payer: BLUE CROSS/BLUE SHIELD | Attending: Emergency Medicine | Admitting: Emergency Medicine

## 2023-01-06 DIAGNOSIS — F430 Acute stress reaction: Secondary | ICD-10-CM | POA: Diagnosis not present

## 2023-01-06 DIAGNOSIS — Z79899 Other long term (current) drug therapy: Secondary | ICD-10-CM | POA: Diagnosis not present

## 2023-01-06 DIAGNOSIS — R112 Nausea with vomiting, unspecified: Secondary | ICD-10-CM | POA: Diagnosis not present

## 2023-01-06 DIAGNOSIS — Z7982 Long term (current) use of aspirin: Secondary | ICD-10-CM | POA: Insufficient documentation

## 2023-01-06 DIAGNOSIS — R0789 Other chest pain: Secondary | ICD-10-CM

## 2023-01-06 DIAGNOSIS — F439 Reaction to severe stress, unspecified: Secondary | ICD-10-CM

## 2023-01-06 LAB — CBC
HCT: 43.5 % (ref 36.0–46.0)
Hemoglobin: 14 g/dL (ref 12.0–15.0)
MCH: 29.2 pg (ref 26.0–34.0)
MCHC: 32.2 g/dL (ref 30.0–36.0)
MCV: 90.6 fL (ref 80.0–100.0)
Platelets: 262 10*3/uL (ref 150–400)
RBC: 4.8 MIL/uL (ref 3.87–5.11)
RDW: 12.7 % (ref 11.5–15.5)
WBC: 6.1 10*3/uL (ref 4.0–10.5)
nRBC: 0 % (ref 0.0–0.2)

## 2023-01-06 LAB — BASIC METABOLIC PANEL
Anion gap: 7 (ref 5–15)
BUN: 17 mg/dL (ref 8–23)
CO2: 26 mmol/L (ref 22–32)
Calcium: 8.8 mg/dL — ABNORMAL LOW (ref 8.9–10.3)
Chloride: 105 mmol/L (ref 98–111)
Creatinine, Ser: 0.5 mg/dL (ref 0.44–1.00)
GFR, Estimated: >60 mL/min (ref >60)
Glucose, Bld: 91 mg/dL (ref 70–99)
Potassium: 3.6 mmol/L (ref 3.5–5.1)
Sodium: 138 mmol/L (ref 135–145)

## 2023-01-06 LAB — TROPONIN I (HIGH SENSITIVITY)
Troponin I (High Sensitivity): 3 ng/L (ref <18)
Troponin I (High Sensitivity): 3 ng/L (ref <18)

## 2023-01-06 MED ORDER — ASPIRIN 81 MG PO CHEW
81.0000 mg | CHEWABLE_TABLET | Freq: Once | ORAL | Status: AC
  Administered 2023-01-06: 81 mg via ORAL
  Filled 2023-01-06: qty 1
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
## 2023-01-06 NOTE — Discharge Instructions (Addendum)
Thank you for letting us take care of you today.  Overall, your workup was reassuring and we did not see any damage to your heart on the blood work, EKG, or chest x-ray we did today.  I suspect that with some any stressors this morning this likely led to the onset of your chest pain which had improved during her ED stay today.  I recommend that you follow-up with your PCP in the next few days to discuss your ED visit today and/or better ways to manage your current stressors and if you should be evaluated by cardiology in the office.  For any new or worsening symptoms, please return to the nearest emergency department for reevaluation.
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
## 2023-01-10 ENCOUNTER — Emergency Department (HOSPITAL_COMMUNITY): Payer: BLUE CROSS/BLUE SHIELD

## 2023-01-10 ENCOUNTER — Emergency Department (HOSPITAL_COMMUNITY)
Admission: EM | Admit: 2023-01-10 | Discharge: 2023-01-10 | Disposition: A | Discharge status: Home / Self Care | Payer: BLUE CROSS/BLUE SHIELD | Attending: Emergency Medicine | Admitting: Emergency Medicine

## 2023-01-10 ENCOUNTER — Encounter (HOSPITAL_COMMUNITY): Payer: Self-pay | Admitting: Emergency Medicine

## 2023-01-10 ENCOUNTER — Other Ambulatory Visit: Payer: Self-pay

## 2023-01-10 DIAGNOSIS — R110 Nausea: Secondary | ICD-10-CM | POA: Diagnosis not present

## 2023-01-10 DIAGNOSIS — I10 Essential (primary) hypertension: Secondary | ICD-10-CM | POA: Diagnosis not present

## 2023-01-10 DIAGNOSIS — R1084 Generalized abdominal pain: Secondary | ICD-10-CM | POA: Diagnosis not present

## 2023-01-10 DIAGNOSIS — R0789 Other chest pain: Secondary | ICD-10-CM | POA: Insufficient documentation

## 2023-01-10 DIAGNOSIS — Z79899 Other long term (current) drug therapy: Secondary | ICD-10-CM | POA: Diagnosis not present

## 2023-01-10 DIAGNOSIS — Z7982 Long term (current) use of aspirin: Secondary | ICD-10-CM | POA: Diagnosis not present

## 2023-01-10 DIAGNOSIS — R079 Chest pain, unspecified: Secondary | ICD-10-CM | POA: Diagnosis present

## 2023-01-10 LAB — CBC
HCT: 41.4 % (ref 36.0–46.0)
Hemoglobin: 13.3 g/dL (ref 12.0–15.0)
MCH: 28.8 pg (ref 26.0–34.0)
MCHC: 32.1 g/dL (ref 30.0–36.0)
MCV: 89.6 fL (ref 80.0–100.0)
Platelets: 263 10*3/uL (ref 150–400)
RBC: 4.62 MIL/uL (ref 3.87–5.11)
RDW: 12.7 % (ref 11.5–15.5)
WBC: 4.8 10*3/uL (ref 4.0–10.5)
nRBC: 0 % (ref 0.0–0.2)

## 2023-01-10 LAB — BASIC METABOLIC PANEL
Anion gap: 12 (ref 5–15)
BUN: 12 mg/dL (ref 8–23)
CO2: 25 mmol/L (ref 22–32)
Calcium: 9 mg/dL (ref 8.9–10.3)
Chloride: 103 mmol/L (ref 98–111)
Creatinine, Ser: 0.67 mg/dL (ref 0.44–1.00)
GFR, Estimated: >60 mL/min (ref >60)
Glucose, Bld: 103 mg/dL — ABNORMAL HIGH (ref 70–99)
Potassium: 3.6 mmol/L (ref 3.5–5.1)
Sodium: 140 mmol/L (ref 135–145)

## 2023-01-10 LAB — TROPONIN I (HIGH SENSITIVITY)
Troponin I (High Sensitivity): 4 ng/L (ref <18)
Troponin I (High Sensitivity): 3 ng/L (ref <18)

## 2023-01-10 NOTE — ED Provider Triage Note (Signed)
Emergency Medicine Provider Triage Evaluation Note  Debbie Howard , a 62 y.o. female  was evaluated in triage.  Pt complains of chest pain and emesis.  States this occurs when she gets worked up.  Was seen recently at [facility].  Presents today requesting EKG and blood pressure check to make sure her heart is okay.  Currently rates the pain a 2 out of 10.  Denies shortness of breath, diaphoresis, cough  Review of Systems  Positive: As above Negative: As above  Physical Exam  BP (!) 150/80 (BP Location: Right Arm)   Pulse (!) 58   Temp 98.5 F (36.9 C)   Resp 16   Ht 5\' 6"  (1.676 m)   Wt 74.8 kg   SpO2 99%   BMI 26.63 kg/m  Gen:   Awake, no distress   Resp:  Normal effort  MSK:   Moves extremities without difficulty  Other:    Medical Decision Making  Medically screening exam initiated at 6:59 PM.  Appropriate orders placed.  Debbie Howard was informed that the remainder of the evaluation will be completed by another provider, this initial triage assessment does not replace that evaluation, and the importance of remaining in the ED until their evaluation is complete.  ACS rule out   Nehemiah Massed 01/10/23 1901

## 2023-01-10 NOTE — Discharge Instructions (Signed)
Please be sure to follow-up with your physician.  Return here for concerning changes in your condition. 

## 2023-01-10 NOTE — Telephone Encounter (Signed)
Called [facility] and left voicemail and fax number have not seen any paperwork
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
## 2023-01-31 ENCOUNTER — Other Ambulatory Visit: Payer: Self-pay

## 2023-01-31 ENCOUNTER — Encounter (HOSPITAL_COMMUNITY): Payer: Self-pay

## 2023-01-31 ENCOUNTER — Emergency Department (HOSPITAL_COMMUNITY)
Admission: EM | Admit: 2023-01-31 | Discharge: 2023-01-31 | Disposition: A | Discharge status: Home / Self Care | Payer: BLUE CROSS/BLUE SHIELD | Attending: Emergency Medicine | Admitting: Emergency Medicine

## 2023-01-31 DIAGNOSIS — Z7982 Long term (current) use of aspirin: Secondary | ICD-10-CM | POA: Diagnosis not present

## 2023-01-31 DIAGNOSIS — I10 Essential (primary) hypertension: Secondary | ICD-10-CM | POA: Insufficient documentation

## 2023-01-31 DIAGNOSIS — Z79899 Other long term (current) drug therapy: Secondary | ICD-10-CM | POA: Insufficient documentation

## 2023-01-31 DIAGNOSIS — G44209 Tension-type headache, unspecified, not intractable: Secondary | ICD-10-CM | POA: Diagnosis not present

## 2023-01-31 DIAGNOSIS — R519 Headache, unspecified: Secondary | ICD-10-CM | POA: Diagnosis present

## 2023-01-31 NOTE — Discharge Instructions (Addendum)
You were seen in the ER today for your headache. I am glad you are feeling better. You can take Excedrin migraine in the future to help. Please make sure you are staying well hydrated drinking plenty of fluids, mainly water. If you have any concerns, new or worsening symptoms, please return to the nearest ER for re-evaluation.   Contact a doctor if: Your headache does not get better. Your headache comes back. You have a headache, and sounds, light, or smells bother you. You feel like you may vomit, or you vomit. Your stomach hurts. Get help right away if: You all of a sudden get a very bad headache with any of these things: A stiff neck. Feeling like you may vomit. Vomiting. Feeling mixed up (confused). Feeling weak in one part or one side of your body. Having trouble seeing or speaking, or both. Feeling short of breath. A rash. Feeling very sleepy. Pain in your eye or ear. Trouble walking or balancing. Feeling like you will faint, or you faint.

## 2023-01-31 NOTE — ED Provider Notes (Signed)
[place] EMERGENCY DEPARTMENT AT [facility] Provider Note   CSN: [id number] Arrival date & time: 01/31/23  1253     History Chief Complaint  Patient presents with   Headache    Debbie Howard is a 62 y.o. female with h/o HTN and HLD presents to the ER for evaluation of her frontal headache that started today.  She reports that she has been under stress at work lately so she with her supervisor.  She reports that she got a verbal argument and started having a frontal headache.  She denies any visual symptoms or any radiation of the pain.  Denies any recent fevers or cough or cold symptoms.  Denies any chest pain or shortness of breath.  Denies any neck stiffness or neck pain.  She reports that she was able to speak to her Agricultural consultant and is able to switch locations of her job.  She reports that since then she is felt less stressful and her headache is completely resolved.  She never had any nausea or vomiting or any visual changes.  No medication intervention.  Triage note mentions that she felt dizzy and nauseous however patient denied this to me. She denies that this is the worst headache she has had. Medical history includes hypertension and hyperlipidemia.  Daily medications include losartan and atorvastatin.  Patient reports that she did not take her losartan earlier today but has it in her purse and will take it later.  No known drug allergies.  Denies any tobacco, EtOH, or drug use ever.   Headache Associated symptoms: no abdominal pain, no congestion, no diarrhea, no fever, no nausea, no neck pain, no neck stiffness, no photophobia, no sore throat, no vomiting and no weakness        Home Medications Prior to Admission medications   Medication Sig Start Date End Date Taking? Authorizing Provider  aspirin EC 81 MG tablet Take 1 tablet (81 mg total) by mouth daily. Swallow whole. 05/29/22   Storm Frisk, MD  atorvastatin (LIPITOR) 20 MG tablet Take 1 tablet (20 mg  total) by mouth daily. 11/05/22   Storm Frisk, MD  cyclobenzaprine (FLEXERIL) 10 MG tablet Take 1 tablet (10 mg total) by mouth 3 (three) times daily as needed for muscle spasms. 11/01/22   Domenick Gong, MD  ibuprofen (ADVIL) 600 MG tablet Take 1 tablet (600 mg total) by mouth every 6 (six) hours as needed. 11/01/22   Domenick Gong, MD  Multiple Vitamin (MULTIVITAMIN) tablet Take 1 tablet by mouth daily.    [provider]  valsartan (DIOVAN) 320 MG tablet Take 1 tablet (320 mg total) by mouth daily. 11/05/22   Storm Frisk, MD  diphenhydrAMINE (BENADRYL) 25 MG tablet Take 1 tablet (25 mg total) by mouth every 6 (six) hours as needed. 04/23/20 11/20/20  Jacalyn Lefevre, MD  FLUoxetine (PROZAC) 10 MG tablet Take 1 tablet (10 mg total) by mouth daily. 01/09/19 11/11/19  Elvina Sidle, MD  fluticasone (FLONASE) 50 MCG/ACT nasal spray Place 1 spray into both nostrils daily. Patient not taking: No sig reported 11/09/20 03/08/21  Rolla Etienne, NP      Allergies    Fish allergy, Famotidine, Prednisone, Shellfish allergy, and Iodine    Review of Systems   Review of Systems  Constitutional:  Negative for chills and fever.  HENT:  Negative for congestion, rhinorrhea and sore throat.   Eyes:  Negative for photophobia and visual disturbance.  Respiratory:  Negative for shortness  of breath.   Cardiovascular:  Negative for chest pain.  Gastrointestinal:  Negative for abdominal pain, constipation, diarrhea, nausea and vomiting.  Genitourinary:  Negative for dysuria and hematuria.  Musculoskeletal:  Negative for neck pain and neck stiffness.  Neurological:  Positive for headaches. Negative for syncope, speech difficulty, weakness and light-headedness.    Physical Exam Updated Vital Signs BP (!) 154/90   Pulse 66   Temp 97.9 F (36.6 C) (Oral)   Resp 19   Ht 5\' 6"  (1.676 m)   Wt 70.3 kg   SpO2 99%   BMI 25.02 kg/m  Physical Exam Vitals and nursing note reviewed.   Constitutional:      General: She is not in acute distress.    Appearance: Normal appearance. She is not ill-appearing or toxic-appearing.  HENT:     Head: Normocephalic and atraumatic.  Eyes:     General: No scleral icterus.    Extraocular Movements: Extraocular movements intact.     Pupils: Pupils are equal, round, and reactive to light.  Cardiovascular:     Rate and Rhythm: Normal rate and regular rhythm.  Pulmonary:     Effort: Pulmonary effort is normal.     Breath sounds: Normal breath sounds.  Abdominal:     General: Abdomen is flat.  Musculoskeletal:        General: No deformity.     Cervical back: Normal range of motion. No rigidity.  Skin:    General: Skin is warm and dry.  Neurological:     General: No focal deficit present.     Mental Status: She is alert. Mental status is at baseline.     GCS: GCS eye subscore is 4. GCS verbal subscore is 5. GCS motor subscore is 6.     Cranial Nerves: No cranial nerve deficit, dysarthria or facial asymmetry.     Sensory: No sensory deficit.     Motor: No weakness or pronator drift.     Coordination: Finger-Nose-Finger Test normal.     Gait: Gait normal.     Comments: She is answering questions appropriately with appropriate speech.  Well-appearing.     ED Results / Procedures / Treatments   Labs (all labs ordered are listed, but only abnormal results are displayed) Labs Reviewed - No data to display  EKG None  Radiology No results found.  Procedures Procedures   Medications Ordered in ED Medications - No data to display  ED Course/ Medical Decision Making/ A&P    Medical Decision Making  62 y.o. female presents to the ER today for evaluation of frontal headache. Differential diagnosis includes but is not limited to Stroke, increased ICP, meningitis, CVA, intracranial tumor, venous sinus thrombosis, migraine, cluster headache, hypertension, drug related, head injury, tension headache, sinusitis, dental abscess,  otitis media, TMJ. Vital signs show mildly elevated BP, however pt did not take her medications. Otherwise unremarkable. Physical exam as noted above.   Patient's headache resolved after she was able to speak to her Agricultural consultantdistrict manager and switch to a different work location.  Tension headache likely from stress.  Patient reports that she feels less stressed and her headache is resolved after speaking to her Agricultural consultantdistrict manager.  She is requesting a work note to return back on Monday.  She denies any lightheadedness, dizziness, nausea, photophobia, vision changes, neck stiffness, recent fever cough or cold symptoms.  Her neurological exam is benign. No focal deficit. Well appearing in no acute distress. She is full range of motion of her  neck.  I doubt any TIA or stroke at this time.  No meningeal signs.  Likely tension headache from stress.  Will discharge home with work note and recommend for Excedrin or over-the-counter medications.  Discussed managing stress.  We discussed plan at bedside. We discussed strict return precautions and red flag symptoms. The patient verbalized their understanding and agrees to the plan. The patient is stable and being discharged home in good condition.  Portions of this report may have been transcribed using voice recognition software. Every effort was made to ensure accuracy; however, inadvertent computerized transcription errors may be present.   Final Clinical Impression(s) / ED Diagnoses Final diagnoses:  Tension headache    Rx / DC Orders ED Discharge Orders     None         Achille RichRansom, Christian Borgerding, PA-C 01/31/23 1456    Pricilla LovelessGoldston, Scott, MD 02/01/23 [phone]

## 2023-01-31 NOTE — ED Triage Notes (Signed)
Patient said she has had a headache since Sunday when she was verbally harassed. Feels a little dizzy and nauseous.

## 2023-02-14 ENCOUNTER — Other Ambulatory Visit: Payer: Self-pay | Admitting: Critical Care Medicine

## 2023-02-17 NOTE — Telephone Encounter (Signed)
D/C 11/05/22. Requested Prescriptions  Refused Prescriptions Disp Refills   losartan (COZAAR) 50 MG tablet [Pharmacy Med Name: Losartan Potassium 50 MG Oral Tablet] 90 tablet 0    Sig: Take 1 tablet by mouth once daily     Cardiovascular:  Angiotensin Receptor Blockers Failed - 02/14/2023  4:58 PM      Failed - Last BP in normal range    BP Readings from Last 1 Encounters:  01/31/23 (!) 158/98         Failed - Valid encounter within last 6 months    Recent Outpatient Visits           3 months ago Primary hypertension   [place] [facility] Storm Frisk, MD   5 months ago Chronic midline low back pain, unspecified whether sciatica present   [facility] Hoy Register, MD   8 months ago Acute pain of right hip   [facility] Storm Frisk, MD   2 years ago Encounter to establish care   [facility], Amy J, NP              Passed - Cr in normal range and within 180 days    Creatinine, Ser  Date Value Ref Range Status  01/10/2023 0.67 0.44 - 1.00 mg/dL Final         Passed - K in normal range and within 180 days    Potassium  Date Value Ref Range Status  01/10/2023 3.6 3.5 - 5.1 mmol/L Final         Passed - Patient is not pregnant

## 2023-02-19 ENCOUNTER — Other Ambulatory Visit: Payer: Self-pay | Admitting: Critical Care Medicine

## 2023-02-26 ENCOUNTER — Telehealth: Payer: Self-pay

## 2023-02-26 NOTE — Telephone Encounter (Signed)
Pharmacy is calling on behalf of pt because pt has been taking valsartan (DIOVAN) 320 MG tablet [[id number]] and says it's been making her feel sick. Pt would like to know if she can get losartan (COZAAR) 50 MG tablet [[id number]] filled instead. Please follow up with pharmacy or pt.

## 2023-02-27 NOTE — Telephone Encounter (Signed)
Yes please schedule an office visit

## 2023-02-27 NOTE — Telephone Encounter (Signed)
Spoke with patient . Verified name & DOB    Advised patient  that would need to be seen by one of our providers before Medication change can be made. Patient was in agreement but voice she has limited availability. Mychart visit on Monday.

## 2023-02-28 ENCOUNTER — Other Ambulatory Visit: Payer: Self-pay | Admitting: Critical Care Medicine

## 2023-03-03 ENCOUNTER — Ambulatory Visit: Payer: BLUE CROSS/BLUE SHIELD | Attending: Family Medicine | Admitting: Family Medicine

## 2023-03-03 DIAGNOSIS — I10 Essential (primary) hypertension: Secondary | ICD-10-CM | POA: Diagnosis not present

## 2023-03-03 MED ORDER — LOSARTAN POTASSIUM 50 MG PO TABS: 50.0000 mg | ORAL_TABLET | Freq: Every day | ORAL | 0 refills | Status: DC

## 2023-03-03 NOTE — Progress Notes (Signed)
Virtual Visit via Telephone Note  I connected with Debbie Howard, on 03/03/2023 at 8:14 AM by telephone and verified that I am speaking with the correct person using two identifiers.   Consent: I discussed the limitations, risks, security and privacy concerns of performing an evaluation and management service by telephone and the availability of in person appointments. I also discussed with the patient that there may be a patient responsible charge related to this service. The patient expressed understanding and agreed to proceed.   Location of Patient: Home  Location of Provider: Clinic   Persons participating in Telemedicine visit: Debbie Howard Dr. Alvis Lemmings     History of Present Illness: FELESHIA EARLE is a 62 y.o. year old female patient of Dr Delford Field with a history of HTN, Hyperlipidemia seen for a visit with complaints about her antihypertensive   She Complains of Valsartan making her sick and so she discontinued it.  BP is in the 150 systolic without her medications  With Losartan her systolic BP was around 115, 120. Review of PCP notes indicate at her Losartan was changed at the 10/2022 visit which was a virtual visit. She states 'she did not ask Dr Delford Field to change her medication' and would like to revert back to taking Losartan. 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    Panic attacks    SAB (spontaneous abortion)    Stroke (HCC)    Transient ischemic attack 2009   x 3   Allergies  Allergen Reactions   Fish Allergy Hives   Famotidine Swelling    REACTION: (per pt)   Prednisone Hives and Swelling   Shellfish Allergy Hives   Iodine Hives and Rash    Current Outpatient Medications on File Prior to Visit  Medication Sig Dispense Refill   aspirin EC 81 MG tablet Take 1 tablet (81 mg total) by  mouth daily. Swallow whole. 30 tablet 12   atorvastatin (LIPITOR) 20 MG tablet Take 1 tablet (20 mg total) by mouth daily. 90 tablet 3   cyclobenzaprine (FLEXERIL) 10 MG tablet Take 1 tablet (10 mg total) by mouth 3 (three) times daily as needed for muscle spasms. 20 tablet 0   ibuprofen (ADVIL) 600 MG tablet Take 1 tablet (600 mg total) by mouth every 6 (six) hours as needed. 30 tablet 0   Multiple Vitamin (MULTIVITAMIN) tablet Take 1 tablet by mouth daily.     valsartan (DIOVAN) 320 MG tablet Take 1 tablet (320 mg total) by mouth daily. 90 tablet 3   [DISCONTINUED] diphenhydrAMINE (BENADRYL) 25 MG tablet Take 1 tablet (25 mg total) by mouth every 6 (six) hours as needed. 30 tablet 0   [DISCONTINUED] FLUoxetine (PROZAC) 10 MG tablet Take 1 tablet (10 mg total) by mouth daily. 30 tablet 3   [DISCONTINUED] fluticasone (FLONASE) 50 MCG/ACT nasal spray Place 1 spray into both nostrils daily. (Patient not taking: No sig reported) 15.8 mL 2   No current facility-administered medications on file prior to visit.    ROS: See HPI  Observations/Objective: Awake, alert, oriented x3 Not in acute distress Normal mood      Latest Ref Rng & Units 01/10/2023    7:11 PM 01/06/2023    9:53 AM 05/29/2022    9:34 AM  CMP  Glucose 70 - 99 mg/dL 161  91  99   BUN 8 - 23 mg/dL 12  17  12    Creatinine 0.44 - 1.00 mg/dL 1.91  4.78  2.95   Sodium 135 - 145 mmol/L 140  138  142   Potassium 3.5 - 5.1 mmol/L 3.6  3.6  4.1   Chloride 98 - 111 mmol/L 103  105  103   CO2 22 - 32 mmol/L 25  26  25    Calcium 8.9 - 10.3 mg/dL 9.0  8.8  9.5   Total Protein 6.0 - 8.5 g/dL   7.4   Total Bilirubin 0.0 - 1.2 mg/dL   0.3   Alkaline Phos 44 - 121 IU/L   84   AST 0 - 40 IU/L   14   ALT 0 - 32 IU/L   15     Lipid Panel     Component Value Date/Time   CHOL 198 05/29/2022 0934   TRIG 49 05/29/2022 0934   HDL 68 05/29/2022 0934   CHOLHDL 2.9 05/29/2022 0934   CHOLHDL 3.6 Ratio 11/27/2009 2018   VLDL 16 11/27/2009  2018   LDLCALC 121 (H) 05/29/2022 0934   LABVLDL 9 05/29/2022 0934    Lab Results  Component Value Date   HGBA1C 5.6 05/29/2022    Assessment and Plan: 1. Primary hypertension I have discontinued valsartan and placed her on losartan since she is adamant about remaining on losartan due to inability to tolerate valsartan Advised her that an in person visit with her PCP Dr. Delford Field is imperative to her blood pressure can be better evaluated as a virtual visit does not properly assess her blood pressure Counseled on blood pressure goal of less than 130/80, low-sodium, DASH diet, medication compliance, 150 minutes of moderate intensity exercise per week. Discussed medication compliance, adverse effects. - losartan (COZAAR) 50 MG tablet; Take 1 tablet (50 mg total) by mouth daily.  Dispense: 90 tablet; Refill: 0   Follow Up Instructions: Schedule appointment with PCP to follow-up on blood pressure   I discussed the assessment and treatment plan with the patient. The patient was provided an opportunity to ask questions and all were answered. The patient agreed with the plan and demonstrated an understanding of the instructions.   The patient was advised to call back or seek an in-person evaluation if the symptoms worsen or if the condition fails to improve as anticipated.     I provided 13 minutes total of non-face-to-face time during this encounter.   Hoy Register, MD, FAAFP. [facility] [place], [place] [phone]   03/03/2023, 8:14 AM

## 2023-03-17 ENCOUNTER — Other Ambulatory Visit: Payer: Self-pay | Admitting: Family Medicine

## 2023-03-17 NOTE — Telephone Encounter (Signed)
Medication Refill - Medication: ibuprofen (ADVIL) 600 MG tablet Pt is requesting to get medication as soon as possible.   Has the patient contacted their pharmacy? No.  Preferred Pharmacy (with phone number or street name):  [facility] [address] [place], [place] - [address] Phone: [phone]  Fax: [phone]     Has the patient been seen for an appointment in the last year OR does the patient have an upcoming appointment? Yes.    Agent: Please be advised that RX refills may take up to 3 business days. We ask that you follow-up with your pharmacy.

## 2023-03-18 MED ORDER — IBUPROFEN 600 MG PO TABS: 600.0000 mg | ORAL_TABLET | Freq: Four times a day (QID) | ORAL | 0 refills | Status: DC | PRN

## 2023-03-18 NOTE — Telephone Encounter (Signed)
Requested medication (s) are due for refill today: review  Requested medication (s) are on the active medication list: yes  Last refill:  1/5/4 #30/0  Future visit scheduled: no  Notes to clinic:  [facility] visit, Unable to refill per protocol, last refill by another provider.    Requested Prescriptions  Pending Prescriptions Disp Refills   ibuprofen (ADVIL) 600 MG tablet 30 tablet 0    Sig: Take 1 tablet (600 mg total) by mouth every 6 (six) hours as needed.     Analgesics:  NSAIDS Failed - 03/18/2023  8:21 AM      Failed - Manual Review: Labs are only required if the patient has taken medication for more than 8 weeks.      Passed - Cr in normal range and within 360 days    Creatinine, Ser  Date Value Ref Range Status  01/10/2023 0.67 0.44 - 1.00 mg/dL Final         Passed - HGB in normal range and within 360 days    Hemoglobin  Date Value Ref Range Status  01/10/2023 13.3 12.0 - 15.0 g/dL Final  09/81/1914 78.2 11.1 - 15.9 g/dL Final         Passed - PLT in normal range and within 360 days    Platelets  Date Value Ref Range Status  01/10/2023 263 150 - 400 K/uL Final  05/29/2022 284 150 - 450 x10E3/uL Final         Passed - HCT in normal range and within 360 days    HCT  Date Value Ref Range Status  01/10/2023 41.4 36.0 - 46.0 % Final   Hematocrit  Date Value Ref Range Status  05/29/2022 39.1 34.0 - 46.6 % Final         Passed - eGFR is 30 or above and within 360 days    GFR calc Af Amer  Date Value Ref Range Status  05/20/2020 >60 >60 mL/min Final   GFR, Estimated  Date Value Ref Range Status  01/10/2023 >60 >60 mL/min Final    Comment:    (NOTE) Calculated using the CKD-EPI Creatinine Equation (2021)    eGFR  Date Value Ref Range Status  05/29/2022 91 >59 mL/min/1.73 Final         Passed - Patient is not pregnant      Passed - Valid encounter within last 12 months    Recent Outpatient Visits           2 weeks ago Primary hypertension   [facility] [place], Odette Horns, MD   4 months ago Primary hypertension   [place] [facility] Storm Frisk, MD   6 months ago Chronic midline low back pain, unspecified whether sciatica present   [facility] Hoy Register, MD   9 months ago Acute pain of right hip   [facility] Storm Frisk, MD   2 years ago Encounter to establish care   [facility], Salomon Fick, NP

## 2023-03-25 ENCOUNTER — Emergency Department (HOSPITAL_COMMUNITY)
Admission: EM | Admit: 2023-03-25 | Discharge: 2023-03-26 | Disposition: A | Discharge status: Home / Self Care | Payer: BLUE CROSS/BLUE SHIELD | Attending: Emergency Medicine | Admitting: Emergency Medicine

## 2023-03-25 ENCOUNTER — Emergency Department (HOSPITAL_COMMUNITY): Payer: BLUE CROSS/BLUE SHIELD

## 2023-03-25 ENCOUNTER — Other Ambulatory Visit: Payer: Self-pay

## 2023-03-25 DIAGNOSIS — Z7982 Long term (current) use of aspirin: Secondary | ICD-10-CM | POA: Insufficient documentation

## 2023-03-25 DIAGNOSIS — Z8673 Personal history of transient ischemic attack (TIA), and cerebral infarction without residual deficits: Secondary | ICD-10-CM | POA: Insufficient documentation

## 2023-03-25 DIAGNOSIS — Z86011 Personal history of benign neoplasm of the brain: Secondary | ICD-10-CM | POA: Diagnosis not present

## 2023-03-25 DIAGNOSIS — Z79899 Other long term (current) drug therapy: Secondary | ICD-10-CM | POA: Diagnosis not present

## 2023-03-25 DIAGNOSIS — R0789 Other chest pain: Secondary | ICD-10-CM

## 2023-03-25 DIAGNOSIS — I10 Essential (primary) hypertension: Secondary | ICD-10-CM | POA: Insufficient documentation

## 2023-03-25 LAB — TROPONIN I (HIGH SENSITIVITY): Troponin I (High Sensitivity): 4 ng/L

## 2023-03-25 LAB — BASIC METABOLIC PANEL WITH GFR
Anion gap: 10 (ref 5–15)
BUN: 13 mg/dL (ref 8–23)
CO2: 26 mmol/L (ref 22–32)
Calcium: 9.3 mg/dL (ref 8.9–10.3)
Chloride: 104 mmol/L (ref 98–111)
Creatinine, Ser: 0.73 mg/dL (ref 0.44–1.00)
GFR, Estimated: >60 mL/min
Glucose, Bld: 92 mg/dL (ref 70–99)
Potassium: 3.6 mmol/L (ref 3.5–5.1)
Sodium: 140 mmol/L (ref 135–145)

## 2023-03-25 LAB — CBC
HCT: 41.6 % (ref 36.0–46.0)
Hemoglobin: 13.6 g/dL (ref 12.0–15.0)
MCH: 29.1 pg (ref 26.0–34.0)
MCHC: 32.7 g/dL (ref 30.0–36.0)
MCV: 89.1 fL (ref 80.0–100.0)
Platelets: 264 10*3/uL (ref 150–400)
RBC: 4.67 MIL/uL (ref 3.87–5.11)
RDW: 12.6 % (ref 11.5–15.5)
WBC: 5.3 10*3/uL (ref 4.0–10.5)
nRBC: 0 % (ref 0.0–0.2)

## 2023-03-25 NOTE — ED Triage Notes (Signed)
Patient reports central chest pain this evening while having an argument with her daughter , mild SOB , no emesis or diaphoresis .

## 2023-03-26 LAB — TROPONIN I (HIGH SENSITIVITY): Troponin I (High Sensitivity): 4 ng/L (ref <18)

## 2023-03-26 NOTE — ED Provider Notes (Signed)
[place] EMERGENCY DEPARTMENT AT [facility] Provider Note  CSN: [id number] Arrival date & time: 03/25/23 2221  Chief Complaint(s) Chest Pain  HPI Debbie Howard is a 62 y.o. female with a past medical history listed below who presents to the emergency department for chest pain that occurred around 3 to 5 PM while she was having an argument with her daughter.  Pain subsided after she calmed down.  Still had some residual discomfort prompting her visit to the emergency department 5 hours after the incident.  She denies any associated shortness of breath.  No nausea or vomiting.  No recent fevers or infections.  No coughing or congestion.  Her pain was nonexertional and nonradiating.  Reports that she has had similar episodes in the past with stressful situations.  Reports that her pain has completely subsided since she has been in the emergency department.  The history is provided by the patient.    Past Medical History Past Medical History:  Diagnosis Date   CVA (cerebral infarction) 2009   Right frontal lobe precentral gyrus infarct   Hypertension    Meningioma (HCC) 2009    Probable right anterior frontal meningioma.    Panic attacks    SAB (spontaneous abortion)    Stroke (HCC)    Transient ischemic attack 2009   x 3   Patient Active Problem List   Diagnosis Date Noted   Administrative encounter 11/05/2022   Acute pain of right hip 05/29/2022   Primary hypertension 05/29/2022   Mixed hyperlipidemia 05/29/2022   Encounter for screening mammogram for malignant neoplasm of breast 05/29/2022   Encounter for health-related screening 05/29/2022   History of stroke 03/14/2008   Home Medication(s) Prior to Admission medications   Medication Sig Start Date End Date Taking? Authorizing Provider  aspirin EC 81 MG tablet Take 1 tablet (81 mg total) by mouth daily. Swallow whole. 05/29/22   Storm Frisk, MD  atorvastatin (LIPITOR) 20 MG tablet Take 1 tablet (20 mg  total) by mouth daily. 11/05/22   Storm Frisk, MD  cyclobenzaprine (FLEXERIL) 10 MG tablet Take 1 tablet (10 mg total) by mouth 3 (three) times daily as needed for muscle spasms. 11/01/22   Domenick Gong, MD  ibuprofen (ADVIL) 600 MG tablet Take 1 tablet (600 mg total) by mouth every 6 (six) hours as needed. 03/18/23   Hoy Register, MD  losartan (COZAAR) 50 MG tablet Take 1 tablet (50 mg total) by mouth daily. 03/03/23   Hoy Register, MD  Multiple Vitamin (MULTIVITAMIN) tablet Take 1 tablet by mouth daily.    [provider]  diphenhydrAMINE (BENADRYL) 25 MG tablet Take 1 tablet (25 mg total) by mouth every 6 (six) hours as needed. 04/23/20 11/20/20  Jacalyn Lefevre, MD  FLUoxetine (PROZAC) 10 MG tablet Take 1 tablet (10 mg total) by mouth daily. 01/09/19 11/11/19  Elvina Sidle, MD  fluticasone (FLONASE) 50 MCG/ACT nasal spray Place 1 spray into both nostrils daily. Patient not taking: No sig reported 11/09/20 03/08/21  Rolla Etienne, NP  Allergies Fish allergy, Famotidine, Prednisone, Shellfish allergy, and Iodine  Review of Systems Review of Systems As noted in HPI  Physical Exam Vital Signs  I have reviewed the triage vital signs BP 136/73   Pulse 68   Temp 97.9 F (36.6 C) (Oral)   Resp 17   SpO2 99%   Physical Exam Vitals reviewed.  Constitutional:      General: She is not in acute distress.    Appearance: She is well-developed. She is not diaphoretic.  HENT:     Head: Normocephalic and atraumatic.     Nose: Nose normal.  Eyes:     General: No scleral icterus.       Right eye: No discharge.        Left eye: No discharge.     Conjunctiva/sclera: Conjunctivae normal.     Pupils: Pupils are equal, round, and reactive to light.  Cardiovascular:     Rate and Rhythm: Normal rate and regular rhythm.     Heart sounds: No murmur  heard.    No friction rub. No gallop.  Pulmonary:     Effort: Pulmonary effort is normal. No respiratory distress.     Breath sounds: Normal breath sounds. No stridor. No rales.  Abdominal:     General: There is no distension.     Palpations: Abdomen is soft.     Tenderness: There is no abdominal tenderness.  Musculoskeletal:        General: No tenderness.     Cervical back: Normal range of motion and neck supple.  Skin:    General: Skin is warm and dry.     Findings: No erythema or rash.  Neurological:     Mental Status: She is alert and oriented to person, place, and time.     ED Results and Treatments Labs (all labs ordered are listed, but only abnormal results are displayed) Labs Reviewed  BASIC METABOLIC PANEL  CBC  TROPONIN I (HIGH SENSITIVITY)  TROPONIN I (HIGH SENSITIVITY)                                                                                                                         EKG  EKG Interpretation  Date/Time:  Tuesday Mar 25 2023 22:26:55 EDT Ventricular Rate:  58 PR Interval:  140 QRS Duration: 82 QT Interval:  440 QTC Calculation: 431 R Axis:   -25 Text Interpretation: Sinus bradycardia Minimal voltage criteria for LVH, may be normal variant ( R in aVL ) Borderline ECG When compared with ECG of 10-Jan-2023 20:40, PREVIOUS ECG IS PRESENT No significant change was found Confirmed by Drema Pry [phone]) on 03/26/2023 3:06:12 AM       Radiology DG Chest 2 View  Result Date: 03/25/2023 CLINICAL DATA:  Chest pain EXAM: CHEST - 2 VIEW COMPARISON:  None Available. FINDINGS: Lungs are clear. No pneumothorax or pleural effusion. Cardiac size within normal limits. Pulmonary vascularity is normal. Small hiatal hernia. Moderate thoracic dextroscoliosis. No acute bone  abnormality. IMPRESSION: 1. No active cardiopulmonary disease. 2. Small hiatal hernia. Electronically Signed   By: Helyn Numbers M.D.   On: 03/25/2023 22:55    Medications Ordered in  ED Medications - No data to display Procedures Procedures  (including critical care time) Medical Decision Making / ED Course  Click here for ABCD2, HEART and other calculators  Medical Decision Making Amount and/or Complexity of Data Reviewed Labs: ordered. Radiology: ordered.    The patient presents to the ED for the following: Chest pain  This involves an extensive number of treatment options, and is a complaint that carries with it a high risk of complications and morbidity. The differential diagnosis includes but not limited to:  Stress-induced/anxiety chest pain, atypical ACS, GI related process.  Less concern for PE, dissection  Co-morbidities/SDOH that complicate the patient evaluation/care: Noted in HPI   Work up Interpretation and Management:  Cardiac Monitoring/EKG: EKG without acute ischemic changes, dysrhythmias or blocks.  No evidence of pericarditis.  Laboratory Tests ordered listed below with my independent interpretation: CBC without leukocytosis or anemia BMP without significant electrolyte derangements or renal sufficiency Serial troponins negative x 2    Imaging Studies ordered listed below with my independent interpretation: X-ray without evidence of pneumonia, pneumothorax, pulmonary edema or pleural effusion.  Small hiatal hernia noted.  ED Course: Patient remained asymptomatic during my evaluation and reassessments.  Feel that ACS is unlikely.     Final Clinical Impression(s) / ED Diagnoses Final diagnoses:  Atypical chest pain   The patient appears reasonably screened and/or stabilized for discharge and I doubt any other medical condition or other [facility] requiring further screening, evaluation, or treatment in the ED at this time. I have discussed the findings, Dx and Tx plan with the patient/family who expressed understanding and agree(s) with the plan. Discharge instructions discussed at length. The patient/family was given strict return  precautions who verbalized understanding of the instructions. No further questions at time of discharge.  Disposition: Discharge  Condition: Good  ED Discharge Orders     None        Follow Up: Primary care provider  Call  to schedule an appointment for close follow up           This chart was dictated using voice recognition software.  Despite best efforts to proofread,  errors can occur which can change the documentation meaning.    Nira Conn, MD 03/26/23 [phone]

## 2023-04-16 ENCOUNTER — Telehealth: Payer: Self-pay | Admitting: Family Medicine

## 2023-04-16 ENCOUNTER — Telehealth: Payer: Self-pay
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
## 2023-04-17 NOTE — Telephone Encounter (Signed)
Pt is requesting office visit notes showing that she had problems with her legs. Pt is requesting if notes can be scanned to her email. Patient came by but was closed for lunch. Pt does not have access to her email Pts email address [email or web address]

## 2023-04-17 NOTE — Telephone Encounter (Signed)
Pt has been called and she states that she I wanting office notes from her previous visit. Notes has been printed and placed upfront for pick up.

## 2023-04-17 NOTE — Telephone Encounter (Signed)
Pt has been called and informed that office notes can not be emailed, she will come pick them up tomorrow.

## 2023-05-20 ENCOUNTER — Other Ambulatory Visit: Payer: Self-pay | Admitting: Family Medicine

## 2023-05-20 ENCOUNTER — Other Ambulatory Visit: Payer: Self-pay

## 2023-05-20 DIAGNOSIS — I10 Essential (primary) hypertension: Secondary | ICD-10-CM

## 2023-05-20 NOTE — Telephone Encounter (Unsigned)
Copied from CRM [id number]. Topic: General - Other >> May 20, 2023  5:29 PM Everette C wrote: Reason for CRM: Medication Refill - Medication: losartan (COZAAR) 50 MG tablet [[id number]]  cyclobenzaprine (FLEXERIL) 10 MG tablet [[id number]]  ibuprofen (ADVIL) 600 MG tablet [[id number]]  atorvastatin (LIPITOR) 20 MG tablet [[id number]]  Has the patient contacted their pharmacy? Yes.   (Agent: If no, request that the patient contact the pharmacy for the refill. If patient does not wish to contact the pharmacy document the reason why and proceed with request.) (Agent: If yes, when and what did the pharmacy advise?)  Preferred Pharmacy (with phone number or street name): [facility] [address] [place] - [address] [place] [address] Phone: [phone] Fax: [phone] Hours: Not open 24 hours   Has the patient been seen for an appointment in the last year OR does the patient have an upcoming appointment? Yes.    Agent: Please be advised that RX refills may take up to 3 business days. We ask that you follow-up with your pharmacy.

## 2023-05-22 MED ORDER — IBUPROFEN 600 MG PO TABS: 600.0000 mg | ORAL_TABLET | Freq: Four times a day (QID) | ORAL | 0 refills | Status: DC | PRN

## 2023-05-22 MED ORDER — CYCLOBENZAPRINE HCL 10 MG PO TABS: 10.0000 mg | ORAL_TABLET | Freq: Three times a day (TID) | ORAL | 0 refills | Status: DC | PRN

## 2023-05-22 NOTE — Telephone Encounter (Signed)
Requested Prescriptions  Pending Prescriptions Disp Refills   cyclobenzaprine (FLEXERIL) 10 MG tablet 20 tablet     Sig: Take 1 tablet (10 mg total) by mouth 3 (three) times daily as needed for muscle spasms.     Not Delegated - Analgesics:  Muscle Relaxants Failed - 05/21/2023  9:09 AM      Failed - This refill cannot be delegated      Passed - Valid encounter within last 6 months    Recent Outpatient Visits           2 months ago Primary hypertension   [facility] Hoy Register, MD   6 months ago Primary hypertension   [facility] Storm Frisk, MD   8 months ago Chronic midline low back pain, unspecified whether sciatica present   [facility] Hoy Register, MD   11 months ago Acute pain of right hip   [facility] Storm Frisk, MD   2 years ago Encounter to establish care   [facility], Amy J, NP       Future Appointments             In 2 months Hoy Register, MD [facility]            Signed Prescriptions Disp Refills   ibuprofen (ADVIL) 600 MG tablet 30 tablet 0    Sig: Take 1 tablet (600 mg total) by mouth every 6 (six) hours as needed.     Analgesics:  NSAIDS Failed - 05/21/2023  9:09 AM      Failed - Manual Review: Labs are only required if the patient has taken medication for more than 8 weeks.      Passed - Cr in normal range and within 360 days    Creatinine, Ser  Date Value Ref Range Status  03/25/2023 0.73 0.44 - 1.00 mg/dL Final         Passed - HGB in normal range and within 360 days    Hemoglobin  Date Value Ref Range Status  03/25/2023 13.6 12.0 - 15.0 g/dL Final  57/32/2025 42.7 11.1 - 15.9 g/dL Final         Passed - PLT in normal range and within 360 days    Platelets  Date Value Ref Range Status  03/25/2023 264  150 - 400 K/uL Final  05/29/2022 284 150 - 450 x10E3/uL Final         Passed - HCT in normal range and within 360 days    HCT  Date Value Ref Range Status  03/25/2023 41.6 36.0 - 46.0 % Final   Hematocrit  Date Value Ref Range Status  05/29/2022 39.1 34.0 - 46.6 % Final         Passed - eGFR is 30 or above and within 360 days    GFR calc Af Amer  Date Value Ref Range Status  05/20/2020 >60 >60 mL/min Final   GFR, Estimated  Date Value Ref Range Status  03/25/2023 >60 >60 mL/min Final    Comment:    (NOTE) Calculated using the CKD-EPI Creatinine Equation (2021)    eGFR  Date Value Ref Range Status  05/29/2022 91 >59 mL/min/1.73 Final         Passed - Patient is not pregnant      Passed - Valid  encounter within last 12 months    Recent Outpatient Visits           2 months ago Primary hypertension   [facility] [place], Odette Horns, MD   6 months ago Primary hypertension   [place] [facility] Storm Frisk, MD   8 months ago Chronic midline low back pain, unspecified whether sciatica present   [facility] Hoy Register, MD   11 months ago Acute pain of right hip   [facility] Storm Frisk, MD   2 years ago Encounter to establish care   [place] [facility], [place], NP       Future Appointments             In 2 months Hoy Register, MD [facility]            Refused Prescriptions Disp Refills   atorvastatin (LIPITOR) 20 MG tablet 90 tablet     Sig: Take 1 tablet (20 mg total) by mouth daily.     Cardiovascular:  Antilipid - Statins Failed - 05/21/2023  9:09 AM      Failed - Lipid Panel in normal range within the last 12 months    Cholesterol, Total  Date Value Ref Range Status  05/29/2022 198 100 - 199 mg/dL Final   LDL Chol Calc (NIH)  Date Value  Ref Range Status  05/29/2022 121 (H) 0 - 99 mg/dL Final   HDL  Date Value Ref Range Status  05/29/2022 68 >39 mg/dL Final   Triglycerides  Date Value Ref Range Status  05/29/2022 49 0 - 149 mg/dL Final         Passed - Patient is not pregnant      Passed - Valid encounter within last 12 months    Recent Outpatient Visits           2 months ago Primary hypertension   [place] [facility] [place], Odette Horns, MD   6 months ago Primary hypertension   [place] [facility] Storm Frisk, MD   8 months ago Chronic midline low back pain, unspecified whether sciatica present   [facility] Hoy Register, MD   11 months ago Acute pain of right hip   [facility] Storm Frisk, MD   2 years ago Encounter to establish care   [place] [facility], Amy J, NP       Future Appointments             In 2 months Hoy Register, MD [facility]             losartan (COZAAR) 50 MG tablet 90 tablet     Sig: Take 1 tablet (50 mg total) by mouth daily.     Cardiovascular:  Angiotensin Receptor Blockers Passed - 05/21/2023  9:09 AM      Passed - Cr in normal range and within 180 days    Creatinine, Ser  Date Value Ref Range Status  03/25/2023 0.73 0.44 - 1.00 mg/dL Final         Passed - K in normal range and within 180 days    Potassium  Date Value Ref Range Status  03/25/2023 3.6 3.5 - 5.1 mmol/L Final         Passed - Patient is not pregnant      Passed - Last BP in normal range    BP Readings from Last 1 Encounters:  03/26/23 136/73         Passed - Valid encounter within last 6 months    Recent Outpatient Visits           2 months ago Primary hypertension   [facility] Hoy Register, MD   6 months ago Primary hypertension     [facility] Storm Frisk, MD   8 months ago Chronic midline low back pain, unspecified whether sciatica present   [facility] Hoy Register, MD   11 months ago Acute pain of right hip   [facility] Storm Frisk, MD   2 years ago Encounter to establish care   [facility], Salomon Fick, NP       Future Appointments             In 2 months Hoy Register, MD [facility]

## 2023-05-22 NOTE — Telephone Encounter (Signed)
Requested medication (s) are due for refill today:yes  Requested medication (s) are on the active medication list:yes  Last refill:  11/01/22 #20  Future visit scheduled: yes  Notes to clinic:  prescribed by a provider not at this office and this med is not delegated to NT to RF   Requested Prescriptions  Pending Prescriptions Disp Refills   cyclobenzaprine (FLEXERIL) 10 MG tablet 20 tablet     Sig: Take 1 tablet (10 mg total) by mouth 3 (three) times daily as needed for muscle spasms.     Not Delegated - Analgesics:  Muscle Relaxants Failed - 05/21/2023  9:09 AM      Failed - This refill cannot be delegated      Passed - Valid encounter within last 6 months    Recent Outpatient Visits           2 months ago Primary hypertension   [place] [facility] Hoy Register, MD   6 months ago Primary hypertension   [place] [facility] Storm Frisk, MD   8 months ago Chronic midline low back pain, unspecified whether sciatica present   [facility] Hoy Register, MD   11 months ago Acute pain of right hip   [facility] Storm Frisk, MD   2 years ago Encounter to establish care   [facility], Amy J, NP       Future Appointments             In 2 months Hoy Register, MD [facility]            Signed Prescriptions Disp Refills   ibuprofen (ADVIL) 600 MG tablet 30 tablet 0    Sig: Take 1 tablet (600 mg total) by mouth every 6 (six) hours as needed.     Analgesics:  NSAIDS Failed - 05/21/2023  9:09 AM      Failed - Manual Review: Labs are only required if the patient has taken medication for more than 8 weeks.      Passed - Cr in normal range and within 360 days    Creatinine, Ser  Date Value Ref Range Status  03/25/2023 0.73 0.44 - 1.00 mg/dL Final         Passed -  HGB in normal range and within 360 days    Hemoglobin  Date Value Ref Range Status  03/25/2023 13.6 12.0 - 15.0 g/dL Final  82/95/6213 08.6 11.1 - 15.9 g/dL Final         Passed - PLT in normal range and within 360 days    Platelets  Date Value Ref Range Status  03/25/2023 264 150 - 400 K/uL Final  05/29/2022 284 150 - 450 x10E3/uL Final         Passed - HCT in normal range and within 360 days    HCT  Date Value Ref Range Status  03/25/2023 41.6 36.0 - 46.0 % Final   Hematocrit  Date Value Ref Range Status  05/29/2022 39.1 34.0 - 46.6 % Final         Passed - eGFR is 30 or above and within 360 days    GFR calc Af Amer  Date Value Ref Range Status  05/20/2020 >60 >60 mL/min Final   GFR, Estimated  Date Value Ref Range Status  03/25/2023 >60 >60 mL/min  Final    Comment:    (NOTE) Calculated using the CKD-EPI Creatinine Equation (2021)    eGFR  Date Value Ref Range Status  05/29/2022 91 >59 mL/min/1.73 Final         Passed - Patient is not pregnant      Passed - Valid encounter within last 12 months    Recent Outpatient Visits           2 months ago Primary hypertension   [facility] [place], Odette Horns, MD   6 months ago Primary hypertension   [place] Storm Frisk, MD   8 months ago Chronic midline low back pain, unspecified whether sciatica present   [facility] Hoy Register, MD   11 months ago Acute pain of right hip   [facility] Storm Frisk, MD   2 years ago Encounter to establish care   [place] [facility], [place], NP       Future Appointments             In 2 months Hoy Register, MD [address] [facility]            Refused Prescriptions Disp Refills   losartan (COZAAR) 50 MG tablet 90 tablet     Sig: Take 1 tablet (50 mg total) by  mouth daily.     Cardiovascular:  Angiotensin Receptor Blockers Passed - 05/21/2023  9:09 AM      Passed - Cr in normal range and within 180 days    Creatinine, Ser  Date Value Ref Range Status  03/25/2023 0.73 0.44 - 1.00 mg/dL Final         Passed - K in normal range and within 180 days    Potassium  Date Value Ref Range Status  03/25/2023 3.6 3.5 - 5.1 mmol/L Final         Passed - Patient is not pregnant      Passed - Last BP in normal range    BP Readings from Last 1 Encounters:  03/26/23 136/73         Passed - Valid encounter within last 6 months    Recent Outpatient Visits           2 months ago Primary hypertension   [place] [facility] [place], Odette Horns, MD   6 months ago Primary hypertension   [place] [facility] Storm Frisk, MD   8 months ago Chronic midline low back pain, unspecified whether sciatica present   [facility], MD   11 months ago Acute pain of right hip   [facility] Storm Frisk, MD   2 years ago Encounter to establish care    [facility], Amy J, NP       Future Appointments             In 2 months Hoy Register, MD [facility]             atorvastatin (LIPITOR) 20 MG tablet 90 tablet     Sig: Take 1 tablet (20 mg total) by mouth daily.     Cardiovascular:  Antilipid - Statins Failed - 05/21/2023  9:09 AM  Failed - Lipid Panel in normal range within the last 12 months    Cholesterol, Total  Date Value Ref Range Status  05/29/2022 198 100 - 199 mg/dL Final   LDL Chol Calc (NIH)  Date Value Ref Range Status  05/29/2022 121 (H) 0 - 99 mg/dL Final   HDL  Date Value Ref Range Status  05/29/2022 68 >39 mg/dL Final   Triglycerides  Date Value Ref Range Status  05/29/2022 49 0 - 149 mg/dL Final          Passed - Patient is not pregnant      Passed - Valid encounter within last 12 months    Recent Outpatient Visits           2 months ago Primary hypertension   [place] [facility] [place], Odette Horns, MD   6 months ago Primary hypertension   [facility] Storm Frisk, MD   8 months ago Chronic midline low back pain, unspecified whether sciatica present   [facility] Hoy Register, MD   11 months ago Acute pain of right hip   [facility] Storm Frisk, MD   2 years ago Encounter to establish care   [facility], [place], NP       Future Appointments             In 2 months Hoy Register, MD [facility]

## 2023-05-22 NOTE — Telephone Encounter (Signed)
Requested Prescriptions  Pending Prescriptions Disp Refills   cyclobenzaprine (FLEXERIL) 10 MG tablet 20 tablet     Sig: Take 1 tablet (10 mg total) by mouth 3 (three) times daily as needed for muscle spasms.     Not Delegated - Analgesics:  Muscle Relaxants Failed - 05/21/2023  9:09 AM      Failed - This refill cannot be delegated      Passed - Valid encounter within last 6 months    Recent Outpatient Visits           2 months ago Primary hypertension   [place] [facility] Hoy Register, MD   6 months ago Primary hypertension   [place] [facility] Storm Frisk, MD   8 months ago Chronic midline low back pain, unspecified whether sciatica present   [facility] H[facility], MD   11 months ago Acute pain of right hip   [facility] Storm Frisk, MD   2 years ago Encounter to establish care   [facility], Amy J, NP       Future Appointments             In 2 months Hoy Register, MD [facility]             ibuprofen (ADVIL) 600 MG tablet 30 tablet 0    Sig: Take 1 tablet (600 mg total) by mouth every 6 (six) hours as needed.     Analgesics:  NSAIDS Failed - 05/21/2023  9:09 AM      Failed - Manual Review: Labs are only required if the patient has taken medication for more than 8 weeks.      Passed - Cr in normal range and within 360 days    Creatinine, Ser  Date Value Ref Range Status  03/25/2023 0.73 0.44 - 1.00 mg/dL Final         Passed - HGB in normal range and within 360 days    Hemoglobin  Date Value Ref Range Status  03/25/2023 13.6 12.0 - 15.0 g/dL Final  16/07/9603 54.0 11.1 - 15.9 g/dL Final         Passed - PLT in normal range and within 360 days    Platelets  Date Value Ref Range Status  03/25/2023 264 150 - 400 K/uL Final  05/29/2022 284  150 - 450 x10E3/uL Final         Passed - HCT in normal range and within 360 days    HCT  Date Value Ref Range Status  03/25/2023 41.6 36.0 - 46.0 % Final   Hematocrit  Date Value Ref Range Status  05/29/2022 39.1 34.0 - 46.6 % Final         Passed - eGFR is 30 or above and within 360 days    GFR calc Af Amer  Date Value Ref Range Status  05/20/2020 >60 >60 mL/min Final   GFR, Estimated  Date Value Ref Range Status  03/25/2023 >60 >60 mL/min Final    Comment:    (NOTE) Calculated using the CKD-EPI Creatinine Equation (2021)    eGFR  Date Value Ref Range Status  05/29/2022 91 >59 mL/min/1.73 Final         Passed - Patient is not pregnant      Passed - Valid encounter within last 12 months  Recent Outpatient Visits           2 months ago Primary hypertension   [place] [facility] Hoy Register, MD   6 months ago Primary hypertension   [place] Storm Frisk, MD   8 months ago Chronic midline low back pain, unspecified whether sciatica present   [facility] [facility] Hoy Register, MD   11 months ago Acute pain of right hip   [place] Storm Frisk, MD   2 years ago Encounter to establish care   [facility], Amy J, NP       Future Appointments             In 2 months Hoy Register, MD [facility]             atorvastatin (LIPITOR) 20 MG tablet 90 tablet     Sig: Take 1 tablet (20 mg total) by mouth daily.     Cardiovascular:  Antilipid - Statins Failed - 05/21/2023  9:09 AM      Failed - Lipid Panel in normal range within the last 12 months    Cholesterol, Total  Date Value Ref Range Status  05/29/2022 198 100 - 199 mg/dL Final   LDL Chol Calc (NIH)  Date Value Ref Range Status  05/29/2022 121 (H) 0 - 99 mg/dL Final   HDL  Date  Value Ref Range Status  05/29/2022 68 >39 mg/dL Final   Triglycerides  Date Value Ref Range Status  05/29/2022 49 0 - 149 mg/dL Final         Passed - Patient is not pregnant      Passed - Valid encounter within last 12 months    Recent Outpatient Visits           2 months ago Primary hypertension   [place] [facility] [place], Odette Horns, MD   6 months ago Primary hypertension   [place] [facility] Storm Frisk, MD   8 months ago Chronic midline low back pain, unspecified whether sciatica present   [facility] Hoy Register, MD   11 months ago Acute pain of right hip   [facility] Storm Frisk, MD   2 years ago Encounter to establish care    [facility] at [facility], [place], NP       Future Appointments             In 2 months Hoy Register, MD [facility]            Refused Prescriptions Disp Refills   losartan (COZAAR) 50 MG tablet 90 tablet     Sig: Take 1 tablet (50 mg total) by mouth daily.     Cardiovascular:  Angiotensin Receptor Blockers Passed - 05/21/2023  9:09 AM      Passed - Cr in normal range and within 180 days    Creatinine, Ser  Date Value Ref Range Status  03/25/2023 0.73 0.44 - 1.00 mg/dL Final         Passed - K in normal range and within 180 days    Potassium  Date Value Ref Range Status  03/25/2023 3.6 3.5 - 5.1 mmol/L Final  Passed - Patient is not pregnant      Passed - Last BP in normal range    BP Readings from Last 1 Encounters:  03/26/23 136/73         Passed - Valid encounter within last 6 months    Recent Outpatient Visits           2 months ago Primary hypertension   [place] [facility] Hoy Register, MD   6 months ago Primary hypertension   [place] [facility]  Storm Frisk, MD   8 months ago Chronic midline low back pain, unspecified whether sciatica present   [facility] Hoy Register, MD   11 months ago Acute pain of right hip   [facility] Storm Frisk, MD   2 years ago Encounter to establish care   [facility], [place], NP       Future Appointments             In 2 months Hoy Register, MD [facility]

## 2023-06-09 ENCOUNTER — Encounter (HOSPITAL_COMMUNITY): Payer: Self-pay

## 2023-06-09 ENCOUNTER — Emergency Department (HOSPITAL_COMMUNITY)
Admission: EM | Admit: 2023-06-09 | Discharge: 2023-06-09 | Disposition: A | Discharge status: Home / Self Care | Payer: BLUE CROSS/BLUE SHIELD | Attending: Emergency Medicine | Admitting: Emergency Medicine

## 2023-06-09 ENCOUNTER — Emergency Department (HOSPITAL_COMMUNITY): Payer: BLUE CROSS/BLUE SHIELD

## 2023-06-09 DIAGNOSIS — Z23 Encounter for immunization: Secondary | ICD-10-CM | POA: Diagnosis not present

## 2023-06-09 DIAGNOSIS — M25552 Pain in left hip: Secondary | ICD-10-CM | POA: Diagnosis not present

## 2023-06-09 DIAGNOSIS — S50312A Abrasion of left elbow, initial encounter: Secondary | ICD-10-CM | POA: Diagnosis present

## 2023-06-09 DIAGNOSIS — T148XXA Other injury of unspecified body region, initial encounter: Secondary | ICD-10-CM

## 2023-06-09 DIAGNOSIS — Z7982 Long term (current) use of aspirin: Secondary | ICD-10-CM | POA: Diagnosis not present

## 2023-06-09 DIAGNOSIS — Y92481 Parking lot as the place of occurrence of the external cause: Secondary | ICD-10-CM | POA: Insufficient documentation

## 2023-06-09 MED ORDER — IBUPROFEN 400 MG PO TABS: 600.0000 mg | ORAL_TABLET | Freq: Four times a day (QID) | ORAL | Status: DC | PRN

## 2023-06-09 MED ORDER — TETANUS-DIPHTH-ACELL PERTUSSIS 5-2.5-18.5 LF-MCG/0.5 IM SUSY
0.5000 mL | PREFILLED_SYRINGE | Freq: Once | INTRAMUSCULAR | Status: AC
  Administered 2023-06-09: 0.5 mL via INTRAMUSCULAR
  Filled 2023-06-09: qty 0.5

## 2023-06-09 MED ORDER — OXYCODONE-ACETAMINOPHEN 5-325 MG PO TABS: 1.0000 | ORAL_TABLET | Freq: Once | ORAL | Status: DC | PRN

## 2023-06-09 NOTE — Discharge Instructions (Addendum)
You can use your home medications including muscle relaxers as needed for your hip pain and your back pain.
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
## 2023-06-19 ENCOUNTER — Inpatient Hospital Stay: Payer: BLUE CROSS/BLUE SHIELD | Admitting: Physician Assistant

## 2023-06-24 ENCOUNTER — Ambulatory Visit: Payer: BLUE CROSS/BLUE SHIELD | Attending: Physician Assistant | Admitting: Critical Care Medicine

## 2023-06-24 ENCOUNTER — Encounter: Payer: Self-pay | Admitting: Critical Care Medicine

## 2023-06-24 ENCOUNTER — Inpatient Hospital Stay: Payer: BLUE CROSS/BLUE SHIELD | Admitting: Family Medicine

## 2023-06-24 DIAGNOSIS — M25562 Pain in left knee: Secondary | ICD-10-CM

## 2023-06-24 DIAGNOSIS — M79605 Pain in left leg: Secondary | ICD-10-CM | POA: Diagnosis not present

## 2023-06-24 DIAGNOSIS — M25561 Pain in right knee: Secondary | ICD-10-CM | POA: Insufficient documentation

## 2023-06-24 DIAGNOSIS — M79604 Pain in right leg: Secondary | ICD-10-CM | POA: Diagnosis not present

## 2023-06-24 MED ORDER — AMOXICILLIN-POT CLAVULANATE 875-125 MG PO TABS
1.0000 | ORAL_TABLET | Freq: Two times a day (BID) | ORAL | 0 refills | Status: DC
Start: 2023-06-24 — End: 2023-07-14

## 2023-06-24 NOTE — Patient Instructions (Signed)
Take augmentin one twice a day for 10 days antibiotic Change dressing daily for 5-6 days on foot  No other changes  Xrays of legs /knee ordered  Keep October appt with dr Alvis Lemmings
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
## 2023-06-24 NOTE — Assessment & Plan Note (Signed)
Obtain x-rays 

## 2023-07-10 ENCOUNTER — Ambulatory Visit: Payer: Self-pay

## 2023-07-10 NOTE — Telephone Encounter (Signed)
Summary: med management.   Pt stated she picked an amoxicillin-clavulanate (AUGMENTIN) 875-125 MG tablet on August 27th, and pills are changing colors, and they are opening. Stated she would go to pharmacy and make them aware.  Per nurse Misty Stanley advised to send a clinical to call the patient to see why she hasn't completed her course of antibiotics prescribed on 06/24/23.      Left message to call back.

## 2023-07-10 NOTE — Telephone Encounter (Signed)
Second attempt to reach pt.

## 2023-07-10 NOTE — Telephone Encounter (Signed)
Three attempts to reach pt, VM full, unable to leave message to call back. Routing to practice for PCPs review per protocol.
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
## 2023-07-11 NOTE — Telephone Encounter (Signed)
Left message on voicemail to return call.   Advise patient to follow up with [facility] regarding the product that was purchased/ given.  Medication sent on 8/27 to [facility]- [phone]. Will not be able to refill Rx.

## 2023-07-13 ENCOUNTER — Emergency Department (HOSPITAL_COMMUNITY): Payer: BLUE CROSS/BLUE SHIELD

## 2023-07-13 ENCOUNTER — Encounter (HOSPITAL_COMMUNITY): Payer: Self-pay

## 2023-07-13 ENCOUNTER — Other Ambulatory Visit: Payer: Self-pay

## 2023-07-13 ENCOUNTER — Emergency Department (HOSPITAL_COMMUNITY)
Admission: EM | Admit: 2023-07-13 | Discharge: 2023-07-13 | Disposition: A | Discharge status: Home / Self Care | Payer: BLUE CROSS/BLUE SHIELD | Attending: Emergency Medicine | Admitting: Emergency Medicine

## 2023-07-13 DIAGNOSIS — R079 Chest pain, unspecified: Secondary | ICD-10-CM | POA: Diagnosis present

## 2023-07-13 DIAGNOSIS — Z7982 Long term (current) use of aspirin: Secondary | ICD-10-CM | POA: Insufficient documentation

## 2023-07-13 DIAGNOSIS — Z79899 Other long term (current) drug therapy: Secondary | ICD-10-CM | POA: Insufficient documentation

## 2023-07-13 DIAGNOSIS — R0602 Shortness of breath: Secondary | ICD-10-CM | POA: Insufficient documentation

## 2023-07-13 DIAGNOSIS — R0789 Other chest pain: Secondary | ICD-10-CM

## 2023-07-13 DIAGNOSIS — Z8673 Personal history of transient ischemic attack (TIA), and cerebral infarction without residual deficits: Secondary | ICD-10-CM | POA: Insufficient documentation

## 2023-07-13 DIAGNOSIS — I10 Essential (primary) hypertension: Secondary | ICD-10-CM | POA: Diagnosis not present

## 2023-07-13 LAB — CBC
HCT: 41.2 % (ref 36.0–46.0)
Hemoglobin: 13.4 g/dL (ref 12.0–15.0)
MCH: 29.2 pg (ref 26.0–34.0)
MCHC: 32.5 g/dL (ref 30.0–36.0)
MCV: 89.8 fL (ref 80.0–100.0)
Platelets: 246 10*3/uL (ref 150–400)
RBC: 4.59 MIL/uL (ref 3.87–5.11)
RDW: 12.6 % (ref 11.5–15.5)
WBC: 4.1 10*3/uL (ref 4.0–10.5)
nRBC: 0 % (ref 0.0–0.2)

## 2023-07-13 LAB — BASIC METABOLIC PANEL
Anion gap: 7 (ref 5–15)
BUN: 11 mg/dL (ref 8–23)
CO2: 27 mmol/L (ref 22–32)
Calcium: 9.2 mg/dL (ref 8.9–10.3)
Chloride: 107 mmol/L (ref 98–111)
Creatinine, Ser: 0.75 mg/dL (ref 0.44–1.00)
GFR, Estimated: >60 mL/min (ref >60)
Glucose, Bld: 97 mg/dL (ref 70–99)
Potassium: 3.6 mmol/L (ref 3.5–5.1)
Sodium: 141 mmol/L (ref 135–145)

## 2023-07-13 LAB — TROPONIN I (HIGH SENSITIVITY)
Troponin I (High Sensitivity): 4 ng/L (ref <18)
Troponin I (High Sensitivity): 4 ng/L (ref <18)

## 2023-07-13 MED ORDER — ACETAMINOPHEN 500 MG PO TABS
1000.0000 mg | ORAL_TABLET | Freq: Once | ORAL | Status: DC
  Filled 2023-07-13: qty 2
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
## 2023-07-13 NOTE — Discharge Instructions (Signed)
Your workup is reassuring today. No signs of a heart attack.   Please follow-up with your PCP within the next week to address this visit and if you have any further cardiology workup.  They will also be able to set you up with therapy.   Return to the ER if your chest pain worsens, you have left arm pain, nausea or vomiting, any other new or concerning symptoms.

## 2023-07-13 NOTE — ED Notes (Signed)
Patient verbalizes understanding of discharge instructions. Opportunity for questioning and answers were provided. Armband removed by staff, pt discharged from ED. Pt ambulatory to ED waiting room with steady gait.  
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
## 2023-07-14 MED ORDER — AMOXICILLIN-POT CLAVULANATE 875-125 MG PO TABS: 1.0000 | ORAL_TABLET | Freq: Two times a day (BID) | ORAL | 0 refills | Status: DC

## 2023-07-14 NOTE — Telephone Encounter (Signed)
Patient called, left VM to return the call to the office to speak to the NT.   Office attempted to contact patient with provider comments on 07/11/23 as well.   Pt stated she was suppose to receive a call back regarding the Rx for amoxicillin. Pt requests that her call be returned. Cb# [phone]   ----- Message from Payton Doughty sent at 07/10/2023  1:17 PM EDT -----  The pharmacist from [facility] pharmacy called w/ the pt there.  He said the pills got wet, and she had at least 3 days left of the Rx.  Misty Stanley told me to let him know a nurse will call her back. That is what I did.   ----- Message from De Blanch sent at 07/10/2023 12:55 PM EDT -----  Pt stated she picked an amoxicillin-clavulanate (AUGMENTIN) 875-125 MG tablet on August 27th, and pills are changing colors, and they are opening.  Stated she would go to pharmacy and make them aware.   Per nurse Misty Stanley advised to send a clinical to call the patient to see why she hasn't completed her course of antibiotics prescribed on 06/24/23.   Seeking clinical advice.

## 2023-07-14 NOTE — Telephone Encounter (Signed)
Patient returned call regarding refill of augmentin. Patient reports he medication got wet and requesting if PCP will give 2 weeks worth of medication due to  patient was only taking 1 tablet daily and did not know to take 2 times daily. Sores on arm and should "a little better" but sores on foot still healing. Please advise if patient can get 2 weeks refill.

## 2023-07-14 NOTE — Telephone Encounter (Signed)
Patient aware AUGMENTIN has been sent to their pharmacy. Patient phone number has been updated in epic.

## 2023-07-14 NOTE — Addendum Note (Signed)
Addended by: Storm Frisk on: 07/14/2023 04:22 PM   Modules accepted: Orders

## 2023-07-14 NOTE — Telephone Encounter (Signed)
Medication re issued full dose

## 2023-07-18 ENCOUNTER — Other Ambulatory Visit: Payer: Self-pay | Admitting: Critical Care Medicine

## 2023-07-18 NOTE — Telephone Encounter (Signed)
Medication Refill - Medication: Flonase Nasal Spray  Has the patient contacted their pharmacy? Yes.    (Agent: If yes, when and what did the pharmacy advise?) Contact PCP   Preferred Pharmacy (with phone number or street name): [facility] [address] [place] - [address]   Has the patient been seen for an appointment in the last year OR does the patient have an upcoming appointment? Yes.    Agent: Please be advised that RX refills may take up to 3 business days. We ask that you follow-up with your pharmacy.

## 2023-07-21 ENCOUNTER — Telehealth: Payer: Self-pay | Admitting: Critical Care Medicine

## 2023-07-21 MED ORDER — FLUTICASONE PROPIONATE 50 MCG/ACT NA SUSP: 1.0000 | Freq: Every day | NASAL | 2 refills | Status: DC

## 2023-07-21 NOTE — Telephone Encounter (Signed)
Medication Refill - Medication: fluticasone (FLONASE) 50 MCG/ACT nasal spray [[id number]]  DISCONTINUED   Has the patient contacted their pharmacy? Yes.   (Agent: If no, request that the patient contact the pharmacy for the refill. If patient does not wish to contact the pharmacy document the reason why and proceed with request.) (Agent: If yes, when and what did the pharmacy advise?)  Preferred Pharmacy (with phone number or street name):  [facility] [address] [place] - [address] Phone: [phone]  Fax: [phone]     Has the patient been seen for an appointment in the last year OR does the patient have an upcoming appointment? Yes.    Agent: Please be advised that RX refills may take up to 3 business days. We ask that you follow-up with your pharmacy.
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
## 2023-07-21 NOTE — Telephone Encounter (Signed)
Already ordered today in a separate refill encounter.

## 2023-07-22 ENCOUNTER — Ambulatory Visit: Payer: Self-pay

## 2023-07-22 NOTE — Telephone Encounter (Signed)
  Chief Complaint: HTN Symptoms: elevated BP 175/97 Frequency: several weeks  Pertinent Negatives: Patient denies any sx Disposition: [] ED /[] Urgent Care (no appt availability in office) / [] Appointment(In office/virtual)/ []  [place] [facility]/ [] Home Care/ [] Refused Recommended Disposition /[x] [place] [facility]/ []  Follow-up with PCP Additional Notes: pt is requesting to stop losartan and have lisinopril added back to medications along with a beta blocker to help control BP better. Says the losartan hasn't been working for her. No appts available until 08/20/23, offered [facility] for tomorrow. Pt agreed and will go be seen there.   Reason for Disposition  Systolic BP  >= 160 OR Diastolic >= 100  Answer Assessment - Initial Assessment Questions 1. BLOOD PRESSURE: "What is the blood pressure?" "Did you take at least two measurements 5 minutes apart?"     175/97 2. ONSET: "When did you take your blood pressure?"     Today  3. HOW: "How did you take your blood pressure?" (e.g., automatic home BP monitor, visiting nurse)     Home BP  4. HISTORY: "Do you have a history of high blood pressure?"     yes 5. MEDICINES: "Are you taking any medicines for blood pressure?" "Have you missed any doses recently?"     Yes losartan  6. OTHER SYMPTOMS: "Do you have any symptoms?" (e.g., blurred vision, chest pain, difficulty breathing, headache, weakness)     no  Protocols used: Blood Pressure - High-A-AH

## 2023-07-23 ENCOUNTER — Ambulatory Visit: Payer: BLUE CROSS/BLUE SHIELD | Admitting: Physician Assistant

## 2023-07-23 ENCOUNTER — Encounter: Payer: Self-pay | Admitting: Physician Assistant

## 2023-07-23 VITALS — BP 130/85 | HR 60 | Ht 66.5 in | Wt 155.0 lb

## 2023-07-23 DIAGNOSIS — Z8673 Personal history of transient ischemic attack (TIA), and cerebral infarction without residual deficits: Secondary | ICD-10-CM

## 2023-07-23 DIAGNOSIS — I10 Essential (primary) hypertension: Secondary | ICD-10-CM

## 2023-07-23 DIAGNOSIS — R0683 Snoring: Secondary | ICD-10-CM | POA: Diagnosis not present

## 2023-07-23 DIAGNOSIS — E782 Mixed hyperlipidemia: Secondary | ICD-10-CM

## 2023-07-23 DIAGNOSIS — Z09 Encounter for follow-up examination after completed treatment for conditions other than malignant neoplasm: Secondary | ICD-10-CM

## 2023-07-23 MED ORDER — EZETIMIBE 10 MG PO TABS
10.0000 mg | ORAL_TABLET | Freq: Every day | ORAL | 1 refills | Status: DC
Start: 2023-07-23 — End: 2024-08-27

## 2023-07-23 MED ORDER — LISINOPRIL 20 MG PO TABS
20.0000 mg | ORAL_TABLET | Freq: Every day | ORAL | 3 refills | Status: DC
Start: 2023-07-23 — End: 2024-08-06
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
## 2023-07-23 NOTE — Patient Instructions (Signed)
Please check blood pressures daily after sitting still and quiet for at least 5 mins.  Drink 80 ounces water daily

## 2023-07-30 ENCOUNTER — Ambulatory Visit: Payer: BLUE CROSS/BLUE SHIELD | Attending: Family Medicine | Admitting: Family Medicine

## 2023-08-11 ENCOUNTER — Telehealth: Payer: Self-pay | Admitting: Critical Care Medicine

## 2023-08-11 NOTE — Telephone Encounter (Signed)
Copied from CRM [id number]. Topic: Referral - Status >> Aug 06, 2023  4:47 PM Macon Large wrote: Reason for CRM: Terri with [facility] Sleep Study reports that [employer] informed her that a peer to peer is needed for approval for the sleep study. Camelia Eng stated that she was told that a peer to peer is needed within 24 hours and she asked that [employer] be contacted at [phone] Tracking# [id number]. Terri's call back # [phone]

## 2023-08-13 ENCOUNTER — Other Ambulatory Visit: Payer: Self-pay | Admitting: Physician Assistant

## 2023-08-13 DIAGNOSIS — E782 Mixed hyperlipidemia: Secondary | ICD-10-CM

## 2023-08-13 DIAGNOSIS — Z8673 Personal history of transient ischemic attack (TIA), and cerebral infarction without residual deficits: Secondary | ICD-10-CM

## 2023-08-13 DIAGNOSIS — I10 Essential (primary) hypertension: Secondary | ICD-10-CM

## 2023-08-13 DIAGNOSIS — R0683 Snoring: Secondary | ICD-10-CM

## 2023-08-14 NOTE — Telephone Encounter (Signed)
I will be on the look out

## 2023-08-21 ENCOUNTER — Emergency Department (HOSPITAL_COMMUNITY): Payer: BLUE CROSS/BLUE SHIELD

## 2023-08-21 ENCOUNTER — Encounter (HOSPITAL_COMMUNITY): Payer: Self-pay

## 2023-08-21 ENCOUNTER — Other Ambulatory Visit: Payer: Self-pay

## 2023-08-21 ENCOUNTER — Emergency Department (HOSPITAL_COMMUNITY)
Admission: EM | Admit: 2023-08-21 | Discharge: 2023-08-21 | Discharge status: Against Medical Advice | Payer: BLUE CROSS/BLUE SHIELD | Attending: Emergency Medicine | Admitting: Emergency Medicine

## 2023-08-21 DIAGNOSIS — Z79899 Other long term (current) drug therapy: Secondary | ICD-10-CM | POA: Insufficient documentation

## 2023-08-21 DIAGNOSIS — R0789 Other chest pain: Secondary | ICD-10-CM | POA: Insufficient documentation

## 2023-08-21 DIAGNOSIS — Z8673 Personal history of transient ischemic attack (TIA), and cerebral infarction without residual deficits: Secondary | ICD-10-CM | POA: Insufficient documentation

## 2023-08-21 DIAGNOSIS — Z5329 Procedure and treatment not carried out because of patient's decision for other reasons: Secondary | ICD-10-CM | POA: Insufficient documentation

## 2023-08-21 DIAGNOSIS — I10 Essential (primary) hypertension: Secondary | ICD-10-CM | POA: Insufficient documentation

## 2023-08-21 DIAGNOSIS — Z7982 Long term (current) use of aspirin: Secondary | ICD-10-CM | POA: Insufficient documentation

## 2023-08-21 DIAGNOSIS — R079 Chest pain, unspecified: Secondary | ICD-10-CM

## 2023-08-21 LAB — CBC
HCT: 40.8 % (ref 36.0–46.0)
Hemoglobin: 13.2 g/dL (ref 12.0–15.0)
MCH: 28.4 pg (ref 26.0–34.0)
MCHC: 32.4 g/dL (ref 30.0–36.0)
MCV: 87.9 fL (ref 80.0–100.0)
Platelets: 264 10*3/uL (ref 150–400)
RBC: 4.64 MIL/uL (ref 3.87–5.11)
RDW: 12.6 % (ref 11.5–15.5)
WBC: 4.3 10*3/uL (ref 4.0–10.5)
nRBC: 0 % (ref 0.0–0.2)

## 2023-08-21 LAB — TROPONIN I (HIGH SENSITIVITY): Troponin I (High Sensitivity): 4 ng/L (ref <18)

## 2023-08-21 LAB — BASIC METABOLIC PANEL
Anion gap: 10 (ref 5–15)
BUN: 20 mg/dL (ref 8–23)
CO2: 24 mmol/L (ref 22–32)
Calcium: 9.3 mg/dL (ref 8.9–10.3)
Chloride: 106 mmol/L (ref 98–111)
Creatinine, Ser: 0.85 mg/dL (ref 0.44–1.00)
GFR, Estimated: >60 mL/min (ref >60)
Glucose, Bld: 93 mg/dL (ref 70–99)
Potassium: 3.8 mmol/L (ref 3.5–5.1)
Sodium: 140 mmol/L (ref 135–145)

## 2023-08-21 NOTE — Progress Notes (Signed)
CSW attempted to speak with patient about assisted living resources. Patient left AMA. CSW did attach resources to patients AVS. CSW is unsure if patient took her paperwork with her.

## 2023-08-21 NOTE — Discharge Instructions (Addendum)
Assisted Living Facilities [facility] at [facility] [address] [place], [place] [address] [phone]   [place] at [facility] [address], [place], [place] [address] [phone]   [facility]Accept[facility] Medicaid [address] [place], [place] [address] [id number] [address] [place], [place] [address] [phone]   [facility] [address], [place], [place] [address] [phone]   [facility] [address], [place], [place] [address] [phone] [address], [place], [place] [address] [id number]   [facility] [address] [place], [place] [address] [phone]   [facility] [address], [place], [place] [address] [phone]   [facility]Accepts Medicaid. [address]hlich [place], [place] [address] [phone]   [place] [facility] [address], [place], [place] [address] [phone]  [place] [facility] [address], [place], [place] [address] [phone]   [facility] [address] [place], [place] [address] [phone]   [facility] [address] [place], [place] [address] [phone] [address] [place], [place] [address] [phone]  [facility] [address] [place], [place] [address] [phone]  [facility] [address] [place], [place] [address] [phone]  [facility] [address] [place], [place] [address] [phone]  [facility] [address] [place], [place] [address] [phone]    RNCM consulted regarding Memory Care Facilities and Agencies.  RNCM provided the following list for family.  Memory Care Facilities in [place], [place]  Also serving communities of [place].  [place] at [facility] [address], [place], [place] [address] [phone]   [facility] of [facility] and [facility] [address], [place], [place] [address] [phone]  [facility] and [facility] [address] [place], [place] [address] [phone]  [facility] [address], [place], [place] [address] [phone]  [facility] [address], [place], [place] [address] [phone]  [facility] and Memory Care [address] [place], [place] [address] [phone]  [place] [facility] [address] [place], [place] [address] [phone]  [facility] [address] [place], [place] [address] [phone]  [place] [facility] [address], [place], [place] [address] [phone]  [facility] [place], [place] [facility] [address] [place], [place] [address] [phone] of [place] [address], [place], [place] [address] [phone]  [facility] [address] [place], [place] [address] [phone]  The average cost of memory care in [place] is [phone] per month. This is higher than the national median of $5,395. Cheaper nearby regions include [place] with an average starting cost of $5,067.  [email or web address] has helped many in [place] find high-quality senior care. To speak with one of our Family Advisors about memory care options and costs in [place], call [phone].

## 2023-08-21 NOTE — ED Notes (Signed)
This Clinical research associate entered PT's room to introduce oneself and to check on patient's needs. Provider was at bedside removing patient's IV. PT stated she was leaving and could not be redirected or consoled. This Clinical research associate attempted to have PT sign AMA form after provider Jodi Geralds, PA-C advised her of risks of leaving AMA. Pt refused to sign AMA form, AMA form completed by this Clinical research associate and witnessed be Jodi Geralds, [employer].

## 2023-08-21 NOTE — ED Triage Notes (Signed)
Pt c/o chest tightness in entire chest and Left upper back pain started this morning. Pt denies N/V, SOB

## 2023-08-21 NOTE — ED Provider Notes (Signed)
[place] EMERGENCY DEPARTMENT AT [facility] Provider Note   CSN: [id number] Arrival date & time: 08/21/23  4098     History  Chief Complaint  Patient presents with   Chest Pain    DEIJA Howard is a 62 y.o. female.  Debbie Howard is a 62 y.o. female with a history of hypertension, stroke and panic attacks, who presents to the emergency department via EMS for evaluation of chest tightness and pain in her left upper back.  She reports this pain started this morning after she got up.  She reports pain is worse when she leans back against her back, pain is not worse with activity or deep breath.  She has not noticed any other aggravating or alleviating factors.  No associated shortness of breath, palpitations, lightheadedness, syncope, abdominal pain, nausea or vomiting.  Patient also requesting to talk to a Child psychotherapist about other options for housing or senior living facility.   The history is provided by the patient and medical records.  Chest Pain Associated symptoms: no abdominal pain, no cough, no fever, no nausea, no shortness of breath and no vomiting        Home Medications Prior to Admission medications   Medication Sig Start Date End Date Taking? Authorizing Provider  aspirin EC 81 MG tablet Take 1 tablet (81 mg total) by mouth daily. Swallow whole. 05/29/22   Storm Frisk, MD  cyclobenzaprine (FLEXERIL) 10 MG tablet Take 1 tablet (10 mg total) by mouth 3 (three) times daily as needed for muscle spasms. Patient not taking: Reported on 07/23/2023 05/22/23   Hoy Register, MD  ezetimibe (ZETIA) 10 MG tablet Take 1 tablet (10 mg total) by mouth daily. 07/23/23   Anders Simmonds, PA-C  fluticasone (FLONASE) 50 MCG/ACT nasal spray Place 1 spray into both nostrils daily. 07/21/23   Storm Frisk, MD  ibuprofen (ADVIL) 600 MG tablet Take 1 tablet (600 mg total) by mouth every 6 (six) hours as needed. Patient not taking: Reported on 07/23/2023 05/22/23    Hoy Register, MD  lisinopril (ZESTRIL) 20 MG tablet Take 1 tablet (20 mg total) by mouth daily. 07/23/23   Anders Simmonds, PA-C  Multiple Vitamin (MULTIVITAMIN) tablet Take 1 tablet by mouth daily.    [provider]  diphenhydrAMINE (BENADRYL) 25 MG tablet Take 1 tablet (25 mg total) by mouth every 6 (six) hours as needed. 04/23/20 11/20/20  Jacalyn Lefevre, MD  FLUoxetine (PROZAC) 10 MG tablet Take 1 tablet (10 mg total) by mouth daily. 01/09/19 11/11/19  Elvina Sidle, MD      Allergies    Fish allergy, Famotidine, Prednisone, Shellfish allergy, and Iodine    Review of Systems   Review of Systems  Constitutional:  Negative for chills and fever.  HENT: Negative.    Respiratory:  Negative for cough and shortness of breath.   Cardiovascular:  Positive for chest pain.  Gastrointestinal:  Negative for abdominal pain, nausea and vomiting.  All other systems reviewed and are negative.   Physical Exam Updated Vital Signs BP 139/86   Pulse 67   Temp 98.4 F (36.9 C) (Oral)   Resp 17   Ht 5' 6.5" (1.689 m)   Wt 70.3 kg   SpO2 100%   BMI 24.64 kg/m  Physical Exam Vitals and nursing note reviewed.  Constitutional:      General: She is not in acute distress.    Appearance: Normal appearance. She is well-developed. She is not ill-appearing  or diaphoretic.  HENT:     Head: Normocephalic and atraumatic.     Mouth/Throat:     Mouth: Mucous membranes are moist.     Pharynx: Oropharynx is clear.  Eyes:     General:        Right eye: No discharge.        Left eye: No discharge.  Cardiovascular:     Pulses:          Radial pulses are 2+ on the right side and 2+ on the left side.     Heart sounds: No murmur heard.    No friction rub. No gallop.  Pulmonary:     Effort: Pulmonary effort is normal. No respiratory distress.     Breath sounds: No decreased breath sounds, wheezing, rhonchi or rales.  Chest:     Chest wall: No tenderness.  Musculoskeletal:     Right lower  leg: No edema.     Left lower leg: No edema.  Neurological:     Mental Status: She is alert and oriented to person, place, and time.     Coordination: Coordination normal.  Psychiatric:        Mood and Affect: Mood normal.        Behavior: Behavior normal.     ED Results / Procedures / Treatments   Labs (all labs ordered are listed, but only abnormal results are displayed) Labs Reviewed  BASIC METABOLIC PANEL  CBC  TROPONIN I (HIGH SENSITIVITY)  TROPONIN I (HIGH SENSITIVITY)    EKG EKG Interpretation Date/Time:  Thursday August 21 2023 09:32:35 EDT Ventricular Rate:  69 PR Interval:  153 QRS Duration:  87 QT Interval:  401 QTC Calculation: 430 R Axis:   -21  Text Interpretation: Sinus rhythm Left ventricular hypertrophy Borderline T abnormalities, diffuse leads no significant change since Sept 2024 Confirmed by Pricilla Loveless [phone]) on 08/21/2023 9:33:42 AM  Radiology DG Chest 2 View  Result Date: 08/21/2023 CLINICAL DATA:  Provided history: Chest pain. EXAM: CHEST - 2 VIEW COMPARISON:  Chest radiographs 07/13/2023 and earlier. Chest CT 11/07/2018. FINDINGS: Heart size within normal limits. No appreciable airspace consolidation or pulmonary edema. No evidence of pleural effusion or pneumothorax. No acute osseous abnormality identified. 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 Small to moderate-sized hiatal hernia. IMPRESSION: 1. No evidence of an acute cardiopulmonary abnormality. 2. Small to moderate-sized hiatal hernia. 3. 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 Electronically Signed   By: Jackey Loge D.O.   On: 08/21/2023 11:06    Procedures Procedures    Medications Ordered in ED Medications - No data to display  ED Course/ Medical Decision Making/ A&P                                 Medical Decision Making Amount and/or Complexity of Data Reviewed Labs: ordered. Radiology: ordered.   Patient presents to the emergency  department with chest pain. Patient nontoxic appearing, in no apparent distress, vitals without significant abnormality. Fairly benign physical exam.   DDX including but not limited to: ACS, pulmonary embolism, dissection, pneumothorax, pneumonia, arrhythmia, severe anemia, MSK, GERD, anxiety, abdominal process.   Additional history obtained:  Chart & nursing note reviewed.  Patient has been seen many times for similar chest pain complaints previously  EKG: Normal sinus rhythm with no ischemic changes no changes when compared to prior EKG  Lab Tests:  I reviewed & interpreted labs  including:  CBC and basic metabolic panel are unremarkable and initial troponin is not elevated at 4  Imaging Studies ordered:  I ordered and viewed the following imaging, agree with radiologist impression:  No active cardiopulmonary disease  ED Course:    EKG without obvious acute ischemia, delta troponin is pending but overall low suspicion for ACS. Patient is low risk wells, PERC negative, doubt pulmonary embolism. Pain is not a tearing sensation, symmetric pulses, no widening of mediastinum on CXR, doubt dissection. Cardiac monitor reviewed, no notable arrhythmias or tachycardia   I went into the room to discuss initial results with patient that had been reassuring, patient was sitting in the chair dressed and had removed the IV from her arm on her own.  Patient requesting that she would like to leave because the room smells like perfume and she feels like she is getting sick.  She was concerned that there was a needle left in her arm and reports that she has never had an IV before and did not like this.  I discussed with patient that while her initial workup was reassuring we would recommend getting second troponin to further rule out ACS, patient expresses understanding but states she wants to leave and refuses to stay.  Patient was unwilling to wait for paperwork or resources.  Social determinants:   -Patient requested social work consultation to discuss alternative housing options, she currently lives with her daughter but is interested in looking into senior living communities.  TOC consult placed and social work was in the process of gathering resources for patient, but she left prior to speaking with social worker, attempted to provide patient resources but she refused to wait for paperwork.  Portions of this note were generated with Scientist, clinical (histocompatibility and immunogenetics). Dictation errors may occur despite best attempts at proofreading.         Final Clinical Impression(s) / ED Diagnoses Final diagnoses:  Chest pain, unspecified type    Rx / DC Orders ED Discharge Orders     None         Legrand Rams 08/21/23 1131    Pricilla Loveless, MD 08/22/23 [phone]

## 2023-08-22 ENCOUNTER — Other Ambulatory Visit: Payer: Self-pay | Admitting: Critical Care Medicine

## 2023-08-22 ENCOUNTER — Telehealth: Payer: Self-pay | Admitting: Critical Care Medicine

## 2023-08-22 MED ORDER — CYCLOBENZAPRINE HCL 10 MG PO TABS: 10.0000 mg | ORAL_TABLET | Freq: Three times a day (TID) | ORAL | 0 refills | Status: DC | PRN

## 2023-08-22 NOTE — Telephone Encounter (Signed)
Patient called in stated she needs her Flexeril 5mg  desperately as he back is hurting. Please f/u with patient

## 2024-01-16 ENCOUNTER — Emergency Department (HOSPITAL_COMMUNITY)
Admission: EM | Admit: 2024-01-16 | Discharge: 2024-01-16 | Discharge status: Against Medical Advice | Attending: Emergency Medicine | Admitting: Emergency Medicine

## 2024-01-16 ENCOUNTER — Other Ambulatory Visit: Payer: Self-pay

## 2024-01-16 ENCOUNTER — Encounter (HOSPITAL_COMMUNITY): Payer: Self-pay

## 2024-01-16 DIAGNOSIS — Z5321 Procedure and treatment not carried out due to patient leaving prior to being seen by health care provider: Secondary | ICD-10-CM | POA: Insufficient documentation

## 2024-01-16 DIAGNOSIS — R197 Diarrhea, unspecified: Secondary | ICD-10-CM | POA: Insufficient documentation

## 2024-01-16 DIAGNOSIS — R1110 Vomiting, unspecified: Secondary | ICD-10-CM | POA: Diagnosis not present

## 2024-01-16 DIAGNOSIS — R55 Syncope and collapse: Secondary | ICD-10-CM | POA: Diagnosis not present

## 2024-01-16 DIAGNOSIS — R112 Nausea with vomiting, unspecified: Secondary | ICD-10-CM | POA: Diagnosis not present

## 2024-01-16 LAB — COMPREHENSIVE METABOLIC PANEL
ALT: 23 U/L (ref 0–44)
AST: 18 U/L (ref 15–41)
Albumin: 3.9 g/dL (ref 3.5–5.0)
Alkaline Phosphatase: 56 U/L (ref 38–126)
Anion gap: 8 (ref 5–15)
BUN: 13 mg/dL (ref 8–23)
CO2: 26 mmol/L (ref 22–32)
Calcium: 9.6 mg/dL (ref 8.9–10.3)
Chloride: 107 mmol/L (ref 98–111)
Creatinine, Ser: 0.7 mg/dL (ref 0.44–1.00)
GFR, Estimated: >60 mL/min (ref >60)
Glucose, Bld: 89 mg/dL (ref 70–99)
Potassium: 3.8 mmol/L (ref 3.5–5.1)
Sodium: 141 mmol/L (ref 135–145)
Total Bilirubin: 0.8 mg/dL (ref 0.0–1.2)
Total Protein: 7.8 g/dL (ref 6.5–8.1)

## 2024-01-16 LAB — CBC
HCT: 43.6 % (ref 36.0–46.0)
Hemoglobin: 14.2 g/dL (ref 12.0–15.0)
MCH: 28.9 pg (ref 26.0–34.0)
MCHC: 32.6 g/dL (ref 30.0–36.0)
MCV: 88.8 fL (ref 80.0–100.0)
Platelets: 275 10*3/uL (ref 150–400)
RBC: 4.91 MIL/uL (ref 3.87–5.11)
RDW: 13.3 % (ref 11.5–15.5)
WBC: 5.8 10*3/uL (ref 4.0–10.5)
nRBC: 0 % (ref 0.0–0.2)

## 2024-01-16 LAB — URINALYSIS, ROUTINE W REFLEX MICROSCOPIC
Bilirubin Urine: NEGATIVE
Glucose, UA: NEGATIVE mg/dL
Hgb urine dipstick: NEGATIVE
Ketones, ur: NEGATIVE mg/dL
Leukocytes,Ua: NEGATIVE
Nitrite: NEGATIVE
Protein, ur: NEGATIVE mg/dL
Specific Gravity, Urine: 1.02 (ref 1.005–1.030)
pH: 6 (ref 5.0–8.0)

## 2024-01-16 LAB — LIPASE, BLOOD: Lipase: 37 U/L (ref 11–51)

## 2024-01-16 MED ORDER — ONDANSETRON 4 MG PO TBDP
4.0000 mg | ORAL_TABLET | Freq: Once | ORAL | Status: AC | PRN
  Administered 2024-01-16: 4 mg via ORAL
  Filled 2024-01-16: qty 1

## 2024-01-16 NOTE — ED Notes (Signed)
 Pt unable to void at this time.  KM

## 2024-01-16 NOTE — ED Triage Notes (Signed)
 Patient BIB [facility] from [facility]. Patient called because after walking to the store she felt like she was going to pass out, throw up, and have diarrhea. Patient did throw up and have diarrhea, but did not pass out. On EMS arrival patient was GCS 15, A&Ox4, BP 138/80, HR 72 but sitting in the bathroom floor.

## 2024-01-16 NOTE — ED Notes (Signed)
 Pt left before being seen by provider. IV placed in EMS triage was removed by Lyndel Safe NT+3 before elopement. RN Tobi Bastos in EMS triage and RN Thayer Ohm in triage notified.

## 2024-01-16 NOTE — ED Provider Triage Note (Signed)
 Emergency Medicine Provider Triage Evaluation Note  Debbie Howard , a 63 y.o. female  was evaluated in triage.  Pt complains of vomiting and diarrhea that started today patient denies any pain patient denies any fevers.  No history of chronic abdominal issues.  Review of Systems  Positive: Vomiting and diarrhea Negative: Fever  Physical Exam  BP (!) 136/98 (BP Location: Right Arm)   Pulse 64   Temp 98.1 F (36.7 C)   Resp 18   SpO2 98%  Gen:   Awake, no distress   Resp:  Normal effort  MSK:   Moves extremities without difficulty  Other:  Abdomen soft nontender  Medical Decision Making  Medically screening exam initiated at 5:55 PM.  Appropriate orders placed.  CLOTEE SCHLICKER was informed that the remainder of the evaluation will be completed by another provider, this initial triage assessment does not replace that evaluation, and the importance of remaining in the ED until their evaluation is complete.     Linwood Dibbles, MD 01/16/24 [phone]

## 2024-03-16 ENCOUNTER — Telehealth: Payer: Self-pay

## 2024-03-16 NOTE — Telephone Encounter (Signed)
 Part B of [employer] [place] application dropped off at [facility].  I called the patient and first inquired if she lives in [place] because her address is [facility].  She said she uses [place] as her mailing address because she does not want her mail going to her address in [place].  She said she needs the rides to get to an appointment.  I explained that [employer] [place] is only for transportation in [place] and she said she understood. She said Dr Brent Cambric filled this out for her after she injured her foot last year;  but she is not sure what happened to it.   I then asked her if she filled out Part A of the application and she did no. She also did not sign the release of information for Part B.  I told her that I would need more information from her in order to fill it out  She said she was in line somewhere and would need to call me back.  In the meantime, I told her that if she needs rides to medical appointments she can check with her insurance company as many of them have a transportation benefit.

## 2024-03-18 ENCOUNTER — Telehealth: Payer: Self-pay

## 2024-03-18 NOTE — Telephone Encounter (Signed)
 FYI Copied from CRM [id number]. Topic: General - Other >> Mar 08, 2024  2:20 PM Elle L wrote:  Reason for CRM: The patient states she will be faxing transportation paperwork for the office to fill out and she is requesting to see if there is an email address that she can send it to as well. I attempted to call the office but there was no answer. She advised it was to get transportation to a Chiropractor as she is experiencing pain in her bones but declined Nurse Triage. Her call back number is [phone].

## 2024-03-24 NOTE — Telephone Encounter (Signed)
 I received Part A of [employer] [place] application completed by the patient as well as a authorization to release information that was signed by patient.    I tried to reach her to further discuss the need for [employer] [place] and had to leave a message requesting a call back.   I need to clarify why she would qualify for the service. Pain listed as a diagnosis will not qualify her for the service and I do not see a qualifying diagnosis in her medical record.

## 2024-03-24 NOTE — Telephone Encounter (Signed)
 Copied from CRM [id number]. Topic: General - Other >> Mar 08, 2024  2:20 PM Elle L wrote:  Reason for CRM: The patient states she will be faxing transportation paperwork for the office to fill out and she is requesting to see if there is an email address that she can send it to as well. I attempted to call the office but there was no answer. She advised it was to get transportation to a Chiropractor as she is experiencing pain in her bones but declined Nurse Triage. Her call back number is [phone].  >> Mar 24, 2024  4:27 PM Felizardo Hotter wrote: Pt calling back for an update of paperwork for transportation. Please call pt at [phone]

## 2024-03-25 NOTE — Telephone Encounter (Signed)
 I spoke to the patient today and the conversation is documented in another telephone encounter today

## 2024-03-25 NOTE — Telephone Encounter (Signed)
 The patient returned my call and I explained that I cannot see a qualifying diagnosis in her chart that would make her eligible for [employer] [place].  I told her that they do not consider pain a qualifying diagnosis. She said she has osteoarthritis but I told her that I do not see information supporting that.  She said she will contact her chiropractor, [facility].  I told her that the chiropractor can complete the application for her if they have the needed records to support her pain.  She said she will call them now. I told her that I can send them the application that she has already completed and she said she will call me back after speaking with that office

## 2024-03-25 NOTE — Telephone Encounter (Signed)
 FYI

## 2024-04-12 NOTE — Telephone Encounter (Signed)
 I spoke to the patient and confirmed the email address for the provider that is to receive the [employer] [place] application

## 2024-04-12 NOTE — Telephone Encounter (Signed)
 I emailed the application as requested and it was returned stating the address is wrong.   I then called the patient and informed her that my email was returned and she said she will need to call that office again to obtain a correct email

## 2024-04-12 NOTE — Telephone Encounter (Signed)
 Copied from CRM [id number]. Topic: General - Other >> Mar 29, 2024 10:52 AM Dorthula Gavel H wrote:  Reason for CRM: pt called in providing information for transportation paperwork, the email is [email or web address]  >> Apr 12, 2024 10:45 AM Felizardo Hotter wrote: Dr. Newlin needs to send to email to Dr. Tyrone Gallop [email or web address]. Pt would like a call back at [phone].

## 2024-04-13 ENCOUNTER — Telehealth: Payer: Self-pay

## 2024-04-13 NOTE — Telephone Encounter (Signed)
 Copied from CRM [id number]. Topic: General - Other >> Apr 13, 2024 12:06 PM Georgeann Kindred wrote: Reason for CRM: Patient returning call to Jerryl Morin to provider her with an email. Email is [email or web address]. Please contact patient at [phone].

## 2024-04-13 NOTE — Telephone Encounter (Signed)
 I spoke to the patient and confirmed the new email provided and then emailed her [employer] [place] application as requested

## 2024-04-13 NOTE — Telephone Encounter (Signed)
 I called the patient to let her know that the email I sent should have gone through because it was not returned to me.  Her voicemail was full, so I was not able to leave her a message.

## 2024-04-14 NOTE — Telephone Encounter (Signed)
 The patient returned my call to inquire if the application went through and I told her it did.

## 2024-04-20 ENCOUNTER — Other Ambulatory Visit: Payer: Self-pay | Admitting: Critical Care Medicine

## 2024-04-20 ENCOUNTER — Other Ambulatory Visit: Payer: Self-pay | Admitting: Family Medicine

## 2024-05-05 NOTE — Telephone Encounter (Signed)
 Copied from CRM [id number]. Topic: General - Other >> May 05, 2024 12:50 PM Antwanette L wrote:  Reason for CRM: Patient wants to know if Slater [employer]el can email over the patient transportation forms to [email or web address] The forms was filled out 3-4 months ago. The paperwork is going to Dr. Winthrop. The patient can be contacted at [phone]

## 2024-05-06 ENCOUNTER — Telehealth: Payer: Self-pay

## 2024-05-06 NOTE — Telephone Encounter (Signed)
 Duplicate message

## 2024-05-06 NOTE — Telephone Encounter (Signed)
 Copied from CRM [id number]. Topic: Medical Record Request - Other >> May 06, 2024 11:41 AM Silvana PARAS wrote: Reason for CRM: Patient wants to know if [employer] can email over the patient transportation forms to [email or web address] The forms was filled out 3-4 months ago. The paperwork is going to Dr. Winthrop. The patient can be contacted at [phone]

## 2024-05-06 NOTE — Telephone Encounter (Signed)
 I called patient back and had to leave a message requesting a call back.  I want to let her know that I sent the application to the provider she requested at the chiropractor's office last month and ask her if this is a different provider.

## 2024-05-13 NOTE — Telephone Encounter (Signed)
 I called patient again  and had to leave a message requesting a call back.   I want to let her know that I sent the application to the provider she requested at the chiropractor's office last month and ask her if this is a different provider.

## 2024-07-09 ENCOUNTER — Emergency Department (HOSPITAL_COMMUNITY): Admission: EM | Admit: 2024-07-09 | Discharge: 2024-07-09 | Disposition: A | Discharge status: Home / Self Care

## 2024-07-09 ENCOUNTER — Encounter (HOSPITAL_COMMUNITY): Payer: Self-pay

## 2024-07-09 ENCOUNTER — Other Ambulatory Visit: Payer: Self-pay

## 2024-07-09 DIAGNOSIS — Z8673 Personal history of transient ischemic attack (TIA), and cerebral infarction without residual deficits: Secondary | ICD-10-CM | POA: Insufficient documentation

## 2024-07-09 DIAGNOSIS — S46912A Strain of unspecified muscle, fascia and tendon at shoulder and upper arm level, left arm, initial encounter: Secondary | ICD-10-CM | POA: Diagnosis not present

## 2024-07-09 DIAGNOSIS — T148XXA Other injury of unspecified body region, initial encounter: Secondary | ICD-10-CM

## 2024-07-09 DIAGNOSIS — M546 Pain in thoracic spine: Secondary | ICD-10-CM | POA: Insufficient documentation

## 2024-07-09 DIAGNOSIS — I10 Essential (primary) hypertension: Secondary | ICD-10-CM | POA: Diagnosis not present

## 2024-07-09 DIAGNOSIS — Y92481 Parking lot as the place of occurrence of the external cause: Secondary | ICD-10-CM | POA: Insufficient documentation

## 2024-07-09 DIAGNOSIS — S46911A Strain of unspecified muscle, fascia and tendon at shoulder and upper arm level, right arm, initial encounter: Secondary | ICD-10-CM | POA: Diagnosis not present

## 2024-07-09 DIAGNOSIS — Z7982 Long term (current) use of aspirin: Secondary | ICD-10-CM | POA: Insufficient documentation

## 2024-07-09 DIAGNOSIS — S161XXA Strain of muscle, fascia and tendon at neck level, initial encounter: Secondary | ICD-10-CM | POA: Diagnosis not present

## 2024-07-09 DIAGNOSIS — S4991XA Unspecified injury of right shoulder and upper arm, initial encounter: Secondary | ICD-10-CM | POA: Diagnosis present

## 2024-07-09 MED ORDER — LIDOCAINE 5 % EX PTCH
2.0000 | MEDICATED_PATCH | CUTANEOUS | Status: DC
  Administered 2024-07-09: 2 via TRANSDERMAL
  Filled 2024-07-09: qty 2

## 2024-07-09 MED ORDER — DICLOFENAC SODIUM 1 % EX GEL: 4.0000 g | Freq: Four times a day (QID) | CUTANEOUS | 0 refills | Status: DC

## 2024-07-09 MED ORDER — ACETAMINOPHEN ER 650 MG PO TBCR: 650.0000 mg | EXTENDED_RELEASE_TABLET | Freq: Three times a day (TID) | ORAL | 0 refills | Status: AC | PRN

## 2024-07-09 MED ORDER — CYCLOBENZAPRINE HCL 10 MG PO TABS
5.0000 mg | ORAL_TABLET | Freq: Once | ORAL | Status: AC
  Administered 2024-07-09: 5 mg via ORAL
  Filled 2024-07-09: qty 1

## 2024-07-09 MED ORDER — ACETAMINOPHEN 325 MG PO TABS
650.0000 mg | ORAL_TABLET | Freq: Once | ORAL | Status: AC
  Administered 2024-07-09: 650 mg via ORAL
  Filled 2024-07-09: qty 2

## 2024-07-09 NOTE — ED Notes (Signed)
AVS with prescriptions provided to and discussed with patient. Pt verbalizes understanding of discharge instructions and denies any questions or concerns at this time. Pt taken out of department via W/C.  ° °
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
## 2024-07-09 NOTE — Discharge Instructions (Addendum)
 Thank you for letting us  evaluate you today.  I do not think that anything is broken.  We have given you pain medicine, muscle relaxer here in the emergency department.  I have also sent extra strength Tylenol , Voltaren  gel to your pharmacy.  Please take as needed.  Voltaren  gel is for topical pain relief.  Return to Emergency Department if you experience loss of consciousness, altered mentation, seizure-like activities, worsening symptoms.  Otherwise you can follow-up with PCP as I provided recommendation above
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
## 2024-07-15 DIAGNOSIS — I10 Essential (primary) hypertension: Secondary | ICD-10-CM | POA: Diagnosis not present

## 2024-07-15 DIAGNOSIS — M25569 Pain in unspecified knee: Secondary | ICD-10-CM | POA: Diagnosis not present

## 2024-07-27 ENCOUNTER — Other Ambulatory Visit: Payer: Self-pay | Admitting: Physician Assistant

## 2024-07-27 DIAGNOSIS — I10 Essential (primary) hypertension: Secondary | ICD-10-CM

## 2024-07-28 ENCOUNTER — Other Ambulatory Visit: Payer: Self-pay | Admitting: Physician Assistant

## 2024-07-28 DIAGNOSIS — I10 Essential (primary) hypertension: Secondary | ICD-10-CM

## 2024-08-06 ENCOUNTER — Telehealth: Payer: Self-pay | Admitting: Pharmacist

## 2024-08-06 ENCOUNTER — Other Ambulatory Visit: Payer: Self-pay | Admitting: Pharmacist

## 2024-08-06 DIAGNOSIS — I10 Essential (primary) hypertension: Secondary | ICD-10-CM

## 2024-08-06 MED ORDER — LISINOPRIL 20 MG PO TABS: 20.0000 mg | ORAL_TABLET | Freq: Every day | ORAL | 0 refills | Status: DC

## 2024-08-06 NOTE — Telephone Encounter (Signed)
 Patient returned call. Appointment scheduled for 10/17 with Dr. Newlin.

## 2024-08-06 NOTE — Telephone Encounter (Signed)
 This is a [facility] patient. She continues to request refills but does not have an appt scheduled and has not seen Dr. Brien since 05/2023. Can we try to get her scheduled with one of our clinics asap? I am unable to provide refills until then.

## 2024-08-13 ENCOUNTER — Encounter: Payer: Self-pay | Admitting: Family Medicine

## 2024-08-13 ENCOUNTER — Ambulatory Visit: Attending: Family Medicine | Admitting: Family Medicine

## 2024-08-13 VITALS — BP 132/84 | HR 70 | Ht 66.0 in | Wt 159.8 lb

## 2024-08-13 DIAGNOSIS — R21 Rash and other nonspecific skin eruption: Secondary | ICD-10-CM | POA: Diagnosis not present

## 2024-08-13 DIAGNOSIS — M170 Bilateral primary osteoarthritis of knee: Secondary | ICD-10-CM

## 2024-08-13 DIAGNOSIS — R110 Nausea: Secondary | ICD-10-CM | POA: Diagnosis not present

## 2024-08-13 DIAGNOSIS — I10 Essential (primary) hypertension: Secondary | ICD-10-CM

## 2024-08-13 MED ORDER — MELOXICAM 7.5 MG PO TABS: 7.5000 mg | ORAL_TABLET | Freq: Every day | ORAL | 1 refills | Status: DC

## 2024-08-13 MED ORDER — LISINOPRIL 20 MG PO TABS: 20.0000 mg | ORAL_TABLET | Freq: Every day | ORAL | 1 refills | Status: AC

## 2024-08-13 MED ORDER — ONDANSETRON HCL 4 MG PO TABS: 4.0000 mg | ORAL_TABLET | Freq: Three times a day (TID) | ORAL | 0 refills | Status: DC | PRN
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
## 2024-08-14 LAB — CMP14+EGFR
ALT: 20 IU/L (ref 0–32)
AST: 19 IU/L (ref 0–40)
Albumin: 4.4 g/dL (ref 3.9–4.9)
Alkaline Phosphatase: 89 IU/L (ref 49–135)
BUN/Creatinine Ratio: 18 (ref 12–28)
BUN: 14 mg/dL (ref 8–27)
Bilirubin Total: 0.3 mg/dL (ref 0.0–1.2)
CO2: 21 mmol/L (ref 20–29)
Calcium: 9.6 mg/dL (ref 8.7–10.3)
Chloride: 103 mmol/L (ref 96–106)
Creatinine, Ser: 0.8 mg/dL (ref 0.57–1.00)
Globulin, Total: 3.1 g/dL (ref 1.5–4.5)
Glucose: 90 mg/dL (ref 70–99)
Potassium: 4.7 mmol/L (ref 3.5–5.2)
Sodium: 141 mmol/L (ref 134–144)
Total Protein: 7.5 g/dL (ref 6.0–8.5)
eGFR: 82 mL/min/1.73 (ref >59)

## 2024-08-14 LAB — LP+NON-HDL CHOLESTEROL
Cholesterol, Total: 245 mg/dL — ABNORMAL HIGH (ref 100–199)
HDL: 58 mg/dL (ref >39)
LDL Chol Calc (NIH): 177 mg/dL — ABNORMAL HIGH (ref 0–99)
Total Non-HDL-Chol (LDL+VLDL): 187 mg/dL — ABNORMAL HIGH (ref 0–129)
Triglycerides: 61 mg/dL (ref 0–149)
VLDL Cholesterol Cal: 10 mg/dL (ref 5–40)

## 2024-08-16 ENCOUNTER — Ambulatory Visit: Payer: Self-pay | Admitting: Family Medicine

## 2024-08-16 MED ORDER — ATORVASTATIN CALCIUM 20 MG PO TABS: 20.0000 mg | ORAL_TABLET | Freq: Every day | ORAL | 1 refills | Status: DC

## 2024-08-27 ENCOUNTER — Encounter (HOSPITAL_COMMUNITY): Payer: Self-pay

## 2024-08-27 ENCOUNTER — Ambulatory Visit (HOSPITAL_COMMUNITY)
Admission: EM | Admit: 2024-08-27 | Discharge: 2024-08-27 | Disposition: A | Discharge status: Home / Self Care | Attending: Emergency Medicine | Admitting: Emergency Medicine

## 2024-08-27 DIAGNOSIS — R079 Chest pain, unspecified: Secondary | ICD-10-CM

## 2024-08-27 NOTE — ED Triage Notes (Signed)
 Patient had a spasm in the upper right chest/shoulder yesterday. States it could be a muscle spasm or stress. Patient states she did leave work today and will need a note.
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
## 2024-08-27 NOTE — Discharge Instructions (Signed)
 Your EKG today was normal.   Please let us  know if there is anything else we can do for you.   Thank you for visiting [facility] today.

## 2024-08-29 ENCOUNTER — Encounter (HOSPITAL_COMMUNITY): Payer: Self-pay | Admitting: Emergency Medicine

## 2024-09-07 ENCOUNTER — Other Ambulatory Visit: Payer: Self-pay | Admitting: Pharmacist

## 2024-09-07 NOTE — Progress Notes (Signed)
 Pharmacy Quality Measure Review  This patient is appearing on a report for the adherence measure for hypertension (ACEi/ARB) medications this calendar year.   Medication: lisinopril  Last fill date: 08/14/2024 for 30 day supply  Insurance report was not up to date. No action needed at this time. Reminder set to call and make sure medication was refilled 09/15/24.  Herlene Fleeta Morris, PharmD, JAQUELINE, CPP Clinical Pharmacist [facility] & [facility] [phone]

## 2024-09-15 ENCOUNTER — Other Ambulatory Visit: Payer: Self-pay | Admitting: Pharmacist

## 2024-09-15 NOTE — Progress Notes (Signed)
 Pharmacy Quality Measure Review  This patient is appearing on a report for the adherence measure for hypertension (ACEi/ARB) medications this calendar year.   Medication: lisinopril  Last fill date: 08/14/2024 for 30 day supply  Collaborated with pharmacy 09/15/2024 to fill rxn that was sent 08/13/24.   Herlene Fleeta Morris, PharmD, JAQUELINE, CPP Clinical Pharmacist [facility] & [facility] [phone]

## 2024-09-17 ENCOUNTER — Other Ambulatory Visit: Payer: Self-pay | Admitting: Pharmacist

## 2024-09-17 NOTE — Progress Notes (Signed)
 Pharmacy Quality Measure Review  This patient is appearing on a report for the adherence measure for hypertension (ACEi/ARB) medications this calendar year.   Medication: lisinopril  Last fill date: 08/14/2024 for 30 day supply  Collaborated with pharmacy 09/15/2024 to fill rxn that was sent 08/13/24. Left VM today to reminder her to pick-up. Reminder set for 09/22/24 to see if she filled this.   Herlene Fleeta Morris, PharmD, JAQUELINE, CPP Clinical Pharmacist [facility] & [facility] [phone]

## 2024-09-22 ENCOUNTER — Other Ambulatory Visit: Payer: Self-pay | Admitting: Pharmacist

## 2024-09-22 NOTE — Progress Notes (Signed)
 Pharmacy Quality Measure Review  This patient is appearing on a report for the adherence measure for hypertension (ACEi/ARB) medications this calendar year.   Medication: lisinopril  Last fill date: 09/21/2024 for 90 day supply  Collaborated with pharmacy 09/15/2024 to fill rxn that was sent 08/13/24. Left VM last week to remind her to pick-up.   She filled this yesterday. No follow-up needed at this time.   Herlene Fleeta Morris, PharmD, JAQUELINE, CPP Clinical Pharmacist [facility] & [facility] [phone]

## 2024-09-28 ENCOUNTER — Ambulatory Visit: Payer: Self-pay

## 2024-09-28 NOTE — Telephone Encounter (Signed)
 FYI Only or Action Required?: FYI only for provider: appointment scheduled on 09/29/2024 at 9 AM.  Patient was last seen in primary care on 08/13/2024 by Delbert Clam, MD.  Called Nurse Triage reporting Skin Problem and Leg Pain.  Symptoms began several weeks ago.  Interventions attempted: Rest, hydration, or home remedies.  Symptoms are: unchanged.  Triage Disposition: See Physician Within 24 Hours  Patient/caregiver understands and will follow disposition?:   Patient reports right leg pain at a level of six. Patient also reports a sore on her left knee that patient is concerned might be infected. Patient is requesting to be seen in the office. No appointments available in PCP office but scheduled patient for 09/29/2024 at 9 AM at an alternative clinic.   Copied from CRM [id number]. Topic: Clinical - Red Word Triage >> Sep 28, 2024  4:39 PM Nathanel BROCKS wrote: Red Word that prompted transfer to Nurse Triage: pt has a sore on her leg, its been there for about 2 week. Other leg swollen, painful.   ----------------------------------------------------------------------- From previous Reason for Contact - Scheduling: Patient/patient representative is calling to schedule an appointment. Refer to attachments for appointment information.    ----------------------------------------------------------------------- From previous Reason for Contact - Medication Refill: Medication:   Has the patient contacted their pharmacy?   (Agent: If no, request that the patient contact the pharmacy for the refill. If patient does not wish to contact the pharmacy document the reason why and proceed with request.) (Agent: If yes, when and what did the pharmacy advise?)  This is the patient's preferred pharmacy:  [facility] [address] [place] - [address] [place] [address] Phone: [phone] Fax: [phone]  [facility] [address] [place]  - [address] [place] [address] Phone: [phone] Fax: [phone]  Is this the correct pharmacy for this prescription?   If no, delete pharmacy and type the correct one.   Has the prescription been filled recently?    Is the patient out of the medication?    Has the patient been seen for an appointment in the last year OR does the patient have an upcoming appointment?    Can we respond through MyChart?    Agent: Please be advised that Rx refills may take up to 3 business days. We ask that you follow-up with your pharmacy. Reason for Disposition  Large sore (> 1 inch or 2.5 cm across)  Answer Assessment - Initial Assessment Questions 1. APPEARANCE of SORES: What do the sores look like?     Patient states the sore on left leg is trying to heal 2. NUMBER: How many sores are there?     1 3. SIZE: How big is the largest sore?     Size of a dime 4. LOCATION: Where are the sores located?     Left leg 5. ONSET: When did the sores begin?     Two weeks ago 6. TENDER: Does it hurt when you touch it?  (Scale 1-10; or mild, moderate, severe)      no 7. CAUSE: What do you think is causing the sores?     Patient fell two weeks ago 8. OTHER SYMPTOMS: Do you have any other symptoms? (e.g., fever, new weakness)     Right leg pain  Answer Assessment - Initial Assessment Questions 1. ONSET: When did the pain start?      Two nights ago 2. LOCATION: Where is the  pain located?      Right leg 3. PAIN: How bad is the pain?    (Scale 1-10; or mild, moderate, severe)     6 4. WORK OR EXERCISE: Has there been any recent work or exercise that involved this part of the body?      no 5. CAUSE: What do you think is causing the leg pain?     Patient did fall on both legs two weeks ago 6. OTHER SYMPTOMS: Do you have any other symptoms? (e.g., chest pain, back pain, breathing difficulty, swelling, rash, fever, numbness,  weakness)     no  Protocols used: Sores-A-AH, Leg Pain-A-AH

## 2024-09-29 ENCOUNTER — Ambulatory Visit: Admitting: Nurse Practitioner

## 2024-09-29 ENCOUNTER — Encounter: Payer: Self-pay | Admitting: Nurse Practitioner

## 2024-09-29 VITALS — BP 115/72 | HR 76 | Wt 156.0 lb

## 2024-09-29 DIAGNOSIS — M25561 Pain in right knee: Secondary | ICD-10-CM | POA: Diagnosis not present

## 2024-09-29 DIAGNOSIS — G8929 Other chronic pain: Secondary | ICD-10-CM | POA: Insufficient documentation

## 2024-09-29 MED ORDER — IBUPROFEN 600 MG PO TABS: 600.0000 mg | ORAL_TABLET | Freq: Three times a day (TID) | ORAL | 0 refills | Status: DC | PRN

## 2024-09-29 NOTE — Assessment & Plan Note (Signed)
 Chronic pain likely due to degenerative changes and increased activity. Previous x-ray showed mild degenerative changes in the right knee. - Administered Toradol  30mg   injection for immediate relief. - Prescribed ibuprofen  600 mg TID PRN with food starting from tomorrow - Advised alternating ibuprofen  with Tylenol  650 mg every six hours PRN.  Encouraged use of heating pad, knee brace - Referred to orthopedics for further evaluation.
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
## 2024-09-29 NOTE — Patient Instructions (Addendum)
 You were given toradol  30mg  injection in the office today  From tomorrow please start taking ibuprofen  600 mg 3 times daily as needed, take ibuprofen  with food, alternating Tylenol  650 mg every 6 hours as needed.  I encourage stretching exercises use of knee brace and heating pad  I have referred you to orthopedics    It is important that you exercise regularly at least 30 minutes 5 times a week as tolerated  Think about what you will eat, plan ahead. Choose  clean, green, fresh or frozen over canned, processed or packaged foods which are more sugary, salty and fatty. 70 to 75% of food eaten should be vegetables and fruit. Three meals at set times with snacks allowed between meals, but they must be fruit or vegetables. Aim to eat over a 12 hour period , example 7 am to 7 pm, and STOP after  your last meal of the day. Drink water,generally about 64 ounces per day, no other drink is as healthy. Fruit juice is best enjoyed in a healthy way, by EATING the fruit.  Thanks for choosing [facility] we consider it a privelige to serve you.

## 2024-10-05 ENCOUNTER — Encounter (HOSPITAL_COMMUNITY): Payer: Self-pay

## 2024-10-05 ENCOUNTER — Ambulatory Visit (INDEPENDENT_AMBULATORY_CARE_PROVIDER_SITE_OTHER): Payer: Self-pay | Admitting: Clinical

## 2024-10-06 NOTE — Progress Notes (Signed)
 No show

## 2024-10-07 ENCOUNTER — Ambulatory Visit: Payer: Self-pay

## 2024-10-07 ENCOUNTER — Ambulatory Visit (INDEPENDENT_AMBULATORY_CARE_PROVIDER_SITE_OTHER): Admitting: Nurse Practitioner

## 2024-10-07 VITALS — BP 137/88 | HR 84 | Wt 157.8 lb

## 2024-10-07 DIAGNOSIS — F419 Anxiety disorder, unspecified: Secondary | ICD-10-CM

## 2024-10-07 DIAGNOSIS — M25561 Pain in right knee: Secondary | ICD-10-CM

## 2024-10-07 DIAGNOSIS — G8929 Other chronic pain: Secondary | ICD-10-CM | POA: Diagnosis not present

## 2024-10-07 MED ORDER — CYCLOBENZAPRINE HCL 10 MG PO TABS: 10.0000 mg | ORAL_TABLET | Freq: Three times a day (TID) | ORAL | 0 refills | Status: DC | PRN

## 2024-10-07 MED ORDER — HYDROXYZINE HCL 10 MG PO TABS: 10.0000 mg | ORAL_TABLET | Freq: Three times a day (TID) | ORAL | 0 refills | Status: DC | PRN
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
## 2024-10-08 ENCOUNTER — Encounter: Payer: Self-pay | Admitting: Nurse Practitioner

## 2024-10-11 ENCOUNTER — Telehealth: Payer: Self-pay | Admitting: Nurse Practitioner

## 2024-10-11 NOTE — Telephone Encounter (Signed)
 Copied from CRM #[id number]. Topic: General - Call Back - No Documentation >> Oct 11, 2024  9:52 AM Tonda B wrote:  Reason for CRM: patient is calling saying that she needs paperwork saying that she was seen 04/2022 and was unbale to stand and or lift anything please call pt after 4p,[phone]

## 2024-10-12 NOTE — Telephone Encounter (Signed)
 Done and placed on your desk to sign. Call pt to advise no answer and could not lvm. KH

## 2024-10-12 NOTE — Telephone Encounter (Signed)
 Letter was made. Called pt and no answer and could not lvm. Will call her back once letter is signed. Kh

## 2024-10-14 ENCOUNTER — Telehealth: Payer: Self-pay | Admitting: Nurse Practitioner

## 2024-10-14 NOTE — Telephone Encounter (Signed)
 Copied from CRM #[id number]. Topic: Clinical - Medical Advice >> Oct 14, 2024  4:17 PM Delon HERO wrote: Reason for CRM: Patient is calling to request a letter to from an expert stating that she has osteoarthritis. Patient is request the letter to be sent to her email by tomorrow. [email or web address] Please advise with the patient

## 2024-10-18 ENCOUNTER — Encounter: Admitting: Physician Assistant

## 2024-10-18 NOTE — Telephone Encounter (Signed)
 Done [facility]

## 2024-10-26 ENCOUNTER — Encounter: Admitting: Physician Assistant

## 2024-11-05 ENCOUNTER — Ambulatory Visit (INDEPENDENT_AMBULATORY_CARE_PROVIDER_SITE_OTHER): Admitting: Family

## 2024-11-05 ENCOUNTER — Ambulatory Visit: Payer: Self-pay

## 2024-11-05 VITALS — BP 152/91 | HR 67 | Temp 97.9°F | Resp 16 | Wt 156.6 lb

## 2024-11-05 DIAGNOSIS — I10 Essential (primary) hypertension: Secondary | ICD-10-CM

## 2024-11-05 DIAGNOSIS — F439 Reaction to severe stress, unspecified: Secondary | ICD-10-CM

## 2024-11-05 DIAGNOSIS — R52 Pain, unspecified: Secondary | ICD-10-CM | POA: Diagnosis not present
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
## 2024-11-05 NOTE — Telephone Encounter (Unsigned)
 Copied from CRM [id number]. Topic: Clinical - Red Word Triage >> Nov 05, 2024  8:53 AM Suzen RAMAN wrote: Red Word that prompted transfer to Nurse Triage: chest pain, feels like she is having a heart attack, requesting an appointment preferably after 2:00 pm

## 2024-11-10 ENCOUNTER — Encounter: Payer: Self-pay | Admitting: Nurse Practitioner

## 2024-11-10 ENCOUNTER — Ambulatory Visit: Payer: Self-pay | Admitting: Nurse Practitioner

## 2024-11-10 VITALS — BP 128/64 | HR 71 | Wt 158.0 lb

## 2024-11-10 DIAGNOSIS — M1711 Unilateral primary osteoarthritis, right knee: Secondary | ICD-10-CM | POA: Diagnosis not present

## 2024-11-10 DIAGNOSIS — M25561 Pain in right knee: Secondary | ICD-10-CM

## 2024-11-10 DIAGNOSIS — F411 Generalized anxiety disorder: Secondary | ICD-10-CM | POA: Insufficient documentation

## 2024-11-10 DIAGNOSIS — F339 Major depressive disorder, recurrent, unspecified: Secondary | ICD-10-CM | POA: Insufficient documentation

## 2024-11-10 DIAGNOSIS — M1712 Unilateral primary osteoarthritis, left knee: Secondary | ICD-10-CM | POA: Diagnosis not present

## 2024-11-10 DIAGNOSIS — G8929 Other chronic pain: Secondary | ICD-10-CM | POA: Diagnosis not present

## 2024-11-10 DIAGNOSIS — I10 Essential (primary) hypertension: Secondary | ICD-10-CM | POA: Diagnosis not present

## 2024-11-10 MED ORDER — IBUPROFEN 600 MG PO TABS: 600.0000 mg | ORAL_TABLET | Freq: Three times a day (TID) | ORAL | 0 refills | Status: DC | PRN

## 2024-11-10 NOTE — Assessment & Plan Note (Addendum)
 Chronic osteoarthritis with mild degenerative changes. - Osteoarthritis of right knee  Chronic osteoarthritis with worsening pain post-trauma.  Takes ibuprofen  800 mg 3 times daily as needed a prefers new orthopedic specialist. - Referred to new orthopedic specialist for evaluation and potential joint injections. - Continue ibuprofen  as needed. - Consider Tylenol  for additional  pain management. - has been Prescribed Flexeril  10 mg TID PRN for muscle spasms
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
## 2024-11-10 NOTE — Assessment & Plan Note (Signed)
" °    11/10/2024    3:28 PM 08/13/2024    9:53 AM 05/29/2022    8:49 AM  Depression screen PHQ 2/9  Decreased Interest 3 0 0  Down, Depressed, Hopeless 2 0 0  PHQ - 2 Score 5 0 0  Altered sleeping 3 0 0  Tired, decreased energy 3 0 0  Change in appetite 0 0 0  Feeling bad or failure about yourself  0 0 0  Trouble concentrating 0 0 0  Moving slowly or fidgety/restless 3 0 0  Suicidal thoughts 0 0 0  PHQ-9 Score 14 0  0   Difficult doing work/chores Somewhat difficult       Data saved with a previous flowsheet row definition   Major depressive disorder Depression worsened by recent stressors. Reluctant to take medication due to side effect concerns. No suicidal ideation "

## 2024-11-10 NOTE — Assessment & Plan Note (Signed)
 Osteoarthritis of right knee and right hip Chronic osteoarthritis with worsening pain post-trauma. Hesitant to use meloxicam . Prefers new orthopedic specialist. - Referred to new orthopedic specialist for evaluation and potential joint injections. - Continue ibuprofen  as needed. - Consider Tylenol  for pain management. - Prescribed Flexeril  10 mg TID PRN for muscle spasms.  Osteoarthritis of left knee Chronic osteoarthritis with mild degenerative changes. - Continue current pain management as needed.  Major depressive disorder Depression worsened by recent stressors. Reluctant to take medication due to side effect concerns. No suicidal ideation

## 2024-11-10 NOTE — Assessment & Plan Note (Signed)
 Osteoarthritis of right knee  Chronic osteoarthritis with worsening pain post-trauma.  Takes ibuprofen  800 mg 3 times daily as needed a prefers new orthopedic specialist. - Referred to new orthopedic specialist for evaluation and potential joint injections. - Continue ibuprofen  as needed. - Consider Tylenol  for additional  pain management. - has been Prescribed Flexeril  10 mg TID PRN for muscle spasms.

## 2024-11-10 NOTE — Patient Instructions (Addendum)
 1. Chronic pain of both knees (Primary) - Ambulatory referral to Orthopedic Surgery      It is important that you exercise regularly at least 30 minutes 5 times a week as tolerated  Think about what you will eat, plan ahead. Choose  clean, green, fresh or frozen over canned, processed or packaged foods which are more sugary, salty and fatty. 70 to 75% of food eaten should be vegetables and fruit. Three meals at set times with snacks allowed between meals, but they must be fruit or vegetables. Aim to eat over a 12 hour period , example 7 am to 7 pm, and STOP after  your last meal of the day. Drink water,generally about 64 ounces per day, no other drink is as healthy. Fruit juice is best enjoyed in a healthy way, by EATING the fruit.  Thanks for choosing [facility] we consider it a privelige to serve you.

## 2024-11-10 NOTE — Assessment & Plan Note (Signed)
" °    11/10/2024    3:26 PM 08/13/2024    9:53 AM 05/29/2022    8:50 AM  GAD 7 : Generalized Anxiety Score  Nervous, Anxious, on Edge 3 0 0  Control/stop worrying 3 0 0  Worry too much - different things 3 0   Trouble relaxing 3 0 0  Restless 3 0 0  Easily annoyed or irritable 3 0 0  Afraid - awful might happen 3 0 0  Total GAD 7 Score 21 0   Anxiety Difficulty Somewhat difficult     Anxiety worsened by recent stressors. Reluctant to take medication due to side effect concerns. No suicidal ideation   "

## 2024-11-10 NOTE — Assessment & Plan Note (Signed)
 BP Readings from Last 3 Encounters:  11/10/24 128/64  11/05/24 (!) 152/91  10/07/24 137/88  Controlled on lisinopril  20 mg daily Continue current medication

## 2024-11-17 ENCOUNTER — Ambulatory Visit: Payer: Self-pay

## 2024-11-17 NOTE — Telephone Encounter (Signed)
 Outbound call to patient to discuss further. Unable to reach left message requesting call back     Message from [facility] G sent at 11/17/2024  3:41 PM EST  Reason for Triage: pain in left leg - going all the way down ( worsening )

## 2024-11-17 NOTE — Telephone Encounter (Signed)
 Attempted to contact pt to f/u on symptoms but NA. LM for her to return call to clinic to speak to staff. Attempt #3. Will route to clinic at this time. If pt calls back, can be sent to NT.    Message from [facility] G sent at 11/17/2024  3:41 PM EST  Reason for Triage: pain in left leg - going all the way down ( worsening )

## 2024-11-18 ENCOUNTER — Ambulatory Visit: Payer: Self-pay
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
## 2024-11-19 ENCOUNTER — Encounter (HOSPITAL_COMMUNITY): Payer: Self-pay | Admitting: Emergency Medicine

## 2024-11-19 ENCOUNTER — Ambulatory Visit (HOSPITAL_COMMUNITY): Admission: EM | Admit: 2024-11-19 | Discharge: 2024-11-19 | Disposition: A | Discharge status: Home / Self Care

## 2024-11-19 DIAGNOSIS — M25562 Pain in left knee: Secondary | ICD-10-CM

## 2024-11-19 DIAGNOSIS — G8929 Other chronic pain: Secondary | ICD-10-CM

## 2024-11-19 DIAGNOSIS — Z0289 Encounter for other administrative examinations: Secondary | ICD-10-CM | POA: Diagnosis not present

## 2024-11-19 NOTE — Discharge Instructions (Addendum)
 You may take Tylenol  1000 mg every 6 hours as needed for pain of the left knee/leg.  Your work note is at the back of your packet.  We are unable to fax work notes to your work unfortunately.  You will need to get that work note to your job by [employer] it to them or faxing it yourself.  You may also take a photo of the work note and text it or email it to your boss via your cell phone.  Please schedule an appointment with [facility] for follow-up and management of your chronic knee pain/chronic left leg pain.
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
## 2024-11-23 ENCOUNTER — Encounter: Payer: Self-pay | Admitting: Nurse Practitioner

## 2025-01-28 ENCOUNTER — Ambulatory Visit: Payer: Self-pay | Admitting: Nurse Practitioner

## 2025-02-04 ENCOUNTER — Ambulatory Visit: Admitting: Nurse Practitioner

## 2025-02-14 ENCOUNTER — Ambulatory Visit: Admitting: Family Medicine
